# Patient Record
Sex: Female | Born: 1985
Health system: Southern US, Community
[De-identification: ages and names within clinical notes are randomized; demographics above are authoritative.]

## PROBLEM LIST (undated history)

## (undated) DIAGNOSIS — T7840XA Allergy, unspecified, initial encounter: Secondary | ICD-10-CM

## (undated) DIAGNOSIS — J45909 Unspecified asthma, uncomplicated: Secondary | ICD-10-CM

## (undated) HISTORY — DX: Allergy, unspecified, initial encounter: T78.40XA

## (undated) HISTORY — DX: Unspecified asthma, uncomplicated: J45.909

---

## 2012-01-01 HISTORY — PX: WISDOM TOOTH EXTRACTION: SHX21

## 2015-08-31 ENCOUNTER — Ambulatory Visit: Payer: BC Managed Care – PPO | Attending: Gynecologic Oncology | Admitting: Gynecologic Oncology

## 2015-08-31 ENCOUNTER — Ambulatory Visit (HOSPITAL_BASED_OUTPATIENT_CLINIC_OR_DEPARTMENT_OTHER): Payer: BC Managed Care – PPO

## 2015-08-31 ENCOUNTER — Encounter: Payer: Self-pay | Admitting: Gynecologic Oncology

## 2015-08-31 VITALS — BP 124/84 | HR 106 | Temp 98.1°F | Resp 20 | Ht 65.0 in | Wt 259.5 lb

## 2015-08-31 DIAGNOSIS — N839 Noninflammatory disorder of ovary, fallopian tube and broad ligament, unspecified: Secondary | ICD-10-CM | POA: Diagnosis not present

## 2015-08-31 DIAGNOSIS — E669 Obesity, unspecified: Secondary | ICD-10-CM | POA: Insufficient documentation

## 2015-08-31 DIAGNOSIS — R1900 Intra-abdominal and pelvic swelling, mass and lump, unspecified site: Secondary | ICD-10-CM | POA: Diagnosis not present

## 2015-08-31 DIAGNOSIS — N838 Other noninflammatory disorders of ovary, fallopian tube and broad ligament: Secondary | ICD-10-CM

## 2015-08-31 LAB — LACTATE DEHYDROGENASE (CC13): LDH: 663 U/L — AB (ref 125–245)

## 2015-08-31 NOTE — Patient Instructions (Signed)
Plan for surgery at [facility] on Tuesday, Sept 6 with Dr. Nancy Marus.  You will receive a phone call from [facility] to have you come in for a pre-op appt.  Please call for any questions or concerns.

## 2015-08-31 NOTE — Progress Notes (Signed)
Consult Note: Gyn-Onc  Sheena Miles 29 y.o. female  CC:  Chief Complaint  Patient presents with  . pelvic mass    new consult    HPI: Patient is seen today in consultation at the request of Dr. [name] for a newly diagnosed ovarian mass. Primary MD. Dr. Leonarda Salon.  Patient is a very pleasant 29 year old gravida 0 whose last cycle was August 11. She states that she has had irregular cycles been going on for many many years. She went through menarche at the age of 68 and has had some irregular cycles since that time. She's been under a lot of stress as she had a difficult relationship with her husband and she left him in June of this year. She began experiencing some pain in her shoulder, abdomen, leg and began having some irregular bowel movements which she felt related to stress. She was seen in urgent care on Monday she felt that she needed medications for anxiety as she was feeling quite tense. At that time and exam was performed that revealed some tenderness in the right lower quadrant. She also had a low-grade temperature and was referred to the emergency room is felt that she might have appendicitis or cholecystitis. She states that in retrospect she has noted that her abdomen was getting "hard" starting in early August. She was also having some back pain. She's had some increasing pelvic pain over the past 2 weeks. She is also experiencing some nausea/vomiting started in June. She's gained approximately 30 pounds in the last 5-6 months and is been having increasing fatigue.  In the emergency room, her CT scan revealed: She had a small right and large left sided pleural effusion with mild underlying atelectasis bilaterally. The gallbladder appeared normal. There was some ascites with a fluid collection in the area of the falciform ligament measuring 4 x 3 cm. There was another lentiform collection with a similar attenuation. Overall she had moderate volume ascites throughout the  abdomen. The pancreas, spleen, adrenals, kidneys, stomach, bowel were unremarkable. There are large diaphragmatic lymph nodes anterior to the heart measuring up to 1.3 cm. There are numerous small mesenteric lymph nodes present. The uterus and ovaries were not discretely identified. There is a multi-cystic septated complex mass arising out of the low pelvis and extending into the left abdomen. It measured 13 x 7 x 7 cm. A prominently had cystic component along the cranial edge along the caudal edge of the abnormality. There is infiltration and probable carcinomatosis of the omentum. There were numerous omental implants with the largest measuring 3.8 cm. A CA-125 was not done.  Review of systems is otherwise unremarkable other than the above. 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 She drinks alcohol socially. She does not smoke. There are no gynecologic malignancies in her family. Her paternal grandmother had breast cancer in her 2s or 66s. Her last Pap smear was approximately 2 years ago. She was told she did not need another one for 3-5 years.  Current Meds:  Outpatient Encounter Prescriptions as of 08/31/2015  Medication Sig  . fexofenadine (ALLEGRA) 60 MG tablet Take 60 mg by mouth daily.  . Multiple Vitamins-Minerals (MULTI-VITAMIN GUMMIES) CHEW Chew by mouth daily.  . ondansetron (ZOFRAN-ODT) 4 MG disintegrating tablet Take 4 mg by mouth every 6 (six) hours as needed for nausea or vomiting.  Marland Kitchen oxyCODONE-acetaminophen (PERCOCET/ROXICET) 5-325 MG per tablet Take 1-2 tablets by mouth every 4 (four) hours as needed for severe  pain.  . Probiotic Product (PROBIOTIC DAILY) CAPS Take 1 capsule by mouth daily.  Marland Kitchen triamcinolone (NASACORT ALLERGY 24HR) 55 MCG/ACT AERO nasal inhaler Place 2 sprays into the nose daily.   No facility-administered encounter medications on file as of 08/31/2015.    Allergy:  Allergies  Allergen Reactions  . Codeine Palpitations    Described by  patient as "horrible panic attack feeling and shortness of breath"    Social Hx:   Social History   Social History  . Marital Status: Legally Separated    Spouse Name: N/A  . Number of Children: N/A  . Years of Education: N/A   Occupational History  . Not on file.   Social History Main Topics  . Smoking status: Never Smoker   . Smokeless tobacco: Not on file  . Alcohol Use: Yes     Comment: 1-2 nights week socially  . Drug Use: No  . Sexual Activity: No   Other Topics Concern  . Not on file   Social History Narrative  . No narrative on file    Past Surgical Hx:  Past Surgical History  Procedure Laterality Date  . Wisdom tooth extraction  2013    Past Medical Hx:  Past Medical History  Diagnosis Date  . Allergy   . Asthma     exercise induced    Oncology Hx:   No history exists.    Family Hx:  Family History  Problem Relation Age of Onset  . Diabetes Father   . Congestive Heart Failure Father   . Crohn's disease Maternal Grandmother   . Breast cancer Paternal Grandmother     Vitals:  Blood pressure 124/84, pulse 106, temperature 98.1 F (36.7 C), temperature source Oral, resp. rate 20, height 5\' 5"  (1.651 m), weight 259 lb 8 oz (117.708 kg), SpO2 98 %.  Physical Exam:  Somewhat ill-appearing female in no acute distress.  Neck: Supple, no lymphadenopathy, no thyromegaly.  Lungs: Decreased breath sounds on the left approximately half way up. Clear to auscultation on the right.  Cardiac: Regular rate and rhythm.  Abdomen: Obese and protuberant. Positive fluid wave. Abdomen is soft, minimally tender to deep palpation in the bilateral lower quadrants. There is no distinct mass appreciated. There is no hepatosplenomegaly. However, exam is limited by habitus.  Groins: No lymphadenopathy.  Extremities: No edema.  Pelvic: Normal female genitalia. The cervix is difficult to visualize it is anteverted posterior to the pubic symphysis. It is nulliparous.  There are no gross visible lesions. Bimanual examination there is a large mass in the cul-de-sac. It could be that the uterus is retroflexed versus an enlarged mass that fills the cul-de-sac it approximately 8 cm. It is smooth. On rectal examination there is no nodularity. I cannot feel the mass anteriorly on abdominal exam.  Assessment/Plan: 29 year old with large abdominal pelvic mass most consistent with a probable ovarian malignancy. She has evidence of ascites, pleural effusion, and omental nodularity. I reviewed this with her and her mother. We will obtain an LDH, AFP, inhibin, CA-125. She's had a negative hCG. I discussed with him that we need to proceed with surgery. That she would undergo an exploratory laparotomy via a midline vertical incision. We will remove the affected ovary first and sent it for frozen section. If there is a malignancy and there is disease on the contralateral ovary she understands that we will proceed with a bilateral salpingo-oophorectomy and appropriate staging/debulking procedure. If her uterus appears unremarkable she would like to keep  in situ for consideration of pregnancy with donor egg in the future.  She was offered surgical dates she's both in [place] and at [facility]. The patient opted to have surgery at [facility] as the OR date was sooner on September 6. She knows that she will have to have a preoperative visit at [facility]. She was given my card and knows that we'll be happy to complete family paperwork for her. I discussed with her that she be out of work 4-6 weeks in the hospital for 3-5 days. She knows that [facility] preop visit as well as have the opportunity to see anesthesia. We discussed postoperative pain management and let it sit for concern of hers.  She has my card and knows that she can contact me with any questions prior to her surgery. Her questions as well as those of her mother were elicited in answer to their satisfaction.  Alessandra Sawdey A.,  MD 08/31/2015, 10:10 AM

## 2015-09-01 LAB — CA 125: CA 125: 33145 U/mL — AB (ref <35)

## 2015-09-01 LAB — AFP TUMOR MARKER: AFP TUMOR MARKER: 2.2 ng/mL (ref <6.1)

## 2015-09-01 LAB — CEA: CEA: <0.5 ng/mL (ref 0.0–5.0)

## 2015-09-08 LAB — INHIBIN B: Inhibin B: <10 pg/mL

## 2015-09-15 ENCOUNTER — Telehealth: Payer: Self-pay | Admitting: *Deleted

## 2015-09-15 NOTE — Telephone Encounter (Signed)
Left message on voice mail with future appointment details . Pt is scheduled on 10/05/2015  @ 3pm to see Dr. Alycia Rossetti.

## 2015-09-21 ENCOUNTER — Ambulatory Visit: Payer: BC Managed Care – PPO | Attending: Gynecologic Oncology | Admitting: Gynecologic Oncology

## 2015-09-21 ENCOUNTER — Encounter: Payer: Self-pay | Admitting: Gynecologic Oncology

## 2015-09-21 ENCOUNTER — Ambulatory Visit (HOSPITAL_BASED_OUTPATIENT_CLINIC_OR_DEPARTMENT_OTHER): Payer: BC Managed Care – PPO

## 2015-09-21 VITALS — BP 123/78 | HR 89 | Temp 98.1°F | Resp 20 | Ht 65.0 in | Wt 244.2 lb

## 2015-09-21 DIAGNOSIS — T8131XA Disruption of external operation (surgical) wound, not elsewhere classified, initial encounter: Secondary | ICD-10-CM | POA: Diagnosis not present

## 2015-09-21 DIAGNOSIS — C569 Malignant neoplasm of unspecified ovary: Secondary | ICD-10-CM | POA: Diagnosis present

## 2015-09-21 LAB — COMPREHENSIVE METABOLIC PANEL (CC13)
ALBUMIN: 2.5 g/dL — AB (ref 3.5–5.0)
ALK PHOS: 106 U/L (ref 40–150)
ALT: 17 U/L (ref 0–55)
AST: 18 U/L (ref 5–34)
Anion Gap: 9 mEq/L (ref 3–11)
BUN: 9.1 mg/dL (ref 7.0–26.0)
CALCIUM: 9 mg/dL (ref 8.4–10.4)
CO2: 26 mEq/L (ref 22–29)
Chloride: 105 mEq/L (ref 98–109)
Creatinine: 0.7 mg/dL (ref 0.6–1.1)
EGFR: >90 mL/min/{1.73_m2} (ref >90)
GLUCOSE: 94 mg/dL (ref 70–140)
Potassium: 4.4 mEq/L (ref 3.5–5.1)
SODIUM: 141 meq/L (ref 136–145)
TOTAL PROTEIN: 6.9 g/dL (ref 6.4–8.3)

## 2015-09-21 LAB — CBC WITH DIFFERENTIAL/PLATELET
BASO%: 1.1 % (ref 0.0–2.0)
BASOS ABS: 0.1 10*3/uL (ref 0.0–0.1)
EOS ABS: 0.1 10*3/uL (ref 0.0–0.5)
EOS%: 1.4 % (ref 0.0–7.0)
HEMATOCRIT: 31.3 % — AB (ref 34.8–46.6)
HEMOGLOBIN: 10.2 g/dL — AB (ref 11.6–15.9)
LYMPH#: 1.6 10*3/uL (ref 0.9–3.3)
LYMPH%: 19.4 % (ref 14.0–49.7)
MCH: 27 pg (ref 25.1–34.0)
MCHC: 32.6 g/dL (ref 31.5–36.0)
MCV: 82.7 fL (ref 79.5–101.0)
MONO#: 0.7 10*3/uL (ref 0.1–0.9)
MONO%: 8 % (ref 0.0–14.0)
NEUT%: 70.1 % (ref 38.4–76.8)
NEUTROS ABS: 5.7 10*3/uL (ref 1.5–6.5)
Platelets: 734 10*3/uL — ABNORMAL HIGH (ref 145–400)
RBC: 3.78 10*6/uL (ref 3.70–5.45)
RDW: 17.4 % — AB (ref 11.2–14.5)
WBC: 8.2 10*3/uL (ref 3.9–10.3)

## 2015-09-21 MED ORDER — HYDROMORPHONE HCL 2 MG PO TABS: 2.0000 mg | ORAL_TABLET | Freq: Four times a day (QID) | ORAL | Status: AC | PRN

## 2015-09-21 MED ORDER — LORAZEPAM 0.5 MG PO TABS
0.5000 mg | ORAL_TABLET | Freq: Three times a day (TID) | ORAL | Status: DC | PRN
Start: 2015-09-21 — End: 2015-10-10

## 2015-09-21 NOTE — Patient Instructions (Addendum)
Pack the opening in your incision twice daily.  Moisten the kerlix gauze roll with saline and squeeze out the excess liquid.  Plan to come to the office tomorrow for dressing change/education.  Please call for office for any questions or concerns.  Continue taking antibiotic given to you at [facility] by Dr. Alycia Rossetti.  No need to change the dressing again today and we will change it in the am.    Dressing Change A dressing is a material placed over wounds. It keeps the wound clean, dry, and protected from further injury.  BEFORE YOU BEGIN  Get your supplies together. Things you may need include:  Salt solution (saline).  Flexible gauze bandage.  Medicated cream.  Tape.  Gloves.  Belly (abdominal) pads.  Gauze squares.  Plastic bags.  Take pain medicine 30 minutes before the bandage change if you need it.  Take a shower before you do the first bandage change of the day. Put plastic wrap or a bag over the dressing. REMOVING YOUR OLD BANDAGE  Wash your hands with soap and water. Dry your hands with a clean towel.  Put on your gloves.  Remove any tape.  Remove the old bandage as told. If it sticks, put a small amount of warm water on it to loosen the bandage.  Remove any gauze or packing tape in your wound.  Take off your gloves.  Put the gloves, tape, gauze, or any packing tape in a plastic bag. CHANGING YOUR BANDAGE  Open the supplies.  Take the cap off the salt solution.  Open the gauze. Leave the gauze on the inside of the package.  Put on your gloves.  Clean your wound as told by your doctor.  Keep your wound dry if your doctor told you to do so.  Your doctor may tell you to do one or more of the following:  Pick up the gauze. Pour the salt solution over the gauze. Squeeze out the extra salt solution.  Put medicated cream or other medicine on your wound.  Put solution soaked gauze only in your wound, not on the skin around it.  Pack your wound loosely.  Put  dry gauze on your wound.  Put belly pads over the dry gauze if your bandages soak through.  Tape the bandage in place so it will not fall off. Do not wrap the tape all the way around your arm or leg.  Wrap the bandage with the flexible gauze bandage as told by your doctor.  Take off your gloves. Put them in the plastic bag with the old bandage. Tie the bag shut and throw it away.  Keep the bandage clean and dry.  Wash your hands. GET HELP RIGHT AWAY IF:   Your skin around the wound looks red.  Your wound feels more tender or sore.  You see yellowish-white fluid (pus) in the wound.  Your wound smells bad.  You have a fever.  Your skin around the wound has a red rash that itches and burns.  You see black or yellow skin in your wound that was not there before.  You feel sick to your stomach (nauseous), throw up (vomit), and feel very tired. Document Released: 03/15/2009 Document Revised: 05/03/2014 Document Reviewed: 10/28/2011 [facility] Patient Information 2015 [place], [place]. This information is not intended to replace advice given to you by your health care provider. Make sure you discuss any questions you have with your health care provider.

## 2015-09-22 ENCOUNTER — Ambulatory Visit: Payer: BC Managed Care – PPO | Attending: Gynecologic Oncology | Admitting: Gynecologic Oncology

## 2015-09-22 VITALS — BP 125/67 | HR 93 | Temp 98.2°F | Resp 18 | Ht 65.0 in | Wt 242.0 lb

## 2015-09-22 DIAGNOSIS — T8131XD Disruption of external operation (surgical) wound, not elsewhere classified, subsequent encounter: Secondary | ICD-10-CM | POA: Insufficient documentation

## 2015-09-22 LAB — CA 125: CA 125: 14065 U/mL — AB (ref <35)

## 2015-09-22 NOTE — Patient Instructions (Addendum)
We will call you with a date and time with meet with Dr. Marko Plume on Sept 30.  We will assess your incision at that time.  Please call for any questions or concerns.  Pack the wound twice daily.  Increase your protein intake.  We will also have you meet with the genetics counselor.  High Protein Diet A high protein diet means that high protein foods are added to your diet. Getting more protein in the diet is important for a number of reasons. Protein helps the body to build tissue, muscle, and to repair damage. People who have had surgery, injuries such as broken bones, infections, and burns, or illnesses such as cancer, may need more protein in their diet.  SERVING SIZES Measuring foods and serving sizes helps to make sure you are getting the right amount of food. The list below tells how big or small some common serving sizes are.   1 oz.........4 stacked dice.  3 oz........Marland KitchenDeck of cards.  1 tsp.......Marland KitchenTip of little finger.  1 tbs......Marland KitchenMarland KitchenThumb.  2 tbs.......Marland KitchenGolf ball.   cup......Marland KitchenHalf of a fist.  1 cup.......Marland KitchenA fist. FOOD SOURCES OF PROTEIN Listed below are some food sources of protein and the amount of protein they contain. Your Registered Dietitian can calculate how many grams of protein you need for your medical condition. High protein foods can be added to the diet at mealtime or as snacks. Be sure to have at least 1 protein-containing food at each meal and snack to ensure adequate intake.  Meats and Meat Substitutes / Protein (g)  3 oz poultry (chicken, [place]) / 26 g  3 oz tuna, canned in water / 26 g  3 oz fish (cod) / 21 g  3 oz red meat (beef, pork) / 21 g  4 oz tofu / 9 g  1 egg / 6 g   cup egg substitute / 5 g  1 cup dried beans / 15 g  1 cup soy milk / 4 g Dairy / Protein (g)  1 cup milk (skim, 1%, 2%, whole) / 8 g   cup evaporated milk / 9 g  1 cup buttermilk / 8 g  1 cup low-fat plain yogurt / 11 g  1 cup regular plain yogurt / 9 g   cup  cottage cheese / 14 g  1 oz cheddar cheese / 7 g Nuts / Protein (g)  2 tbs peanut butter / 8 g  1 oz peanuts / 7 g  2 tbs cashews / 5 g  2 tbs almonds / 5 g Document Released: 12/17/2005 Document Revised: 03/10/2012 Document Reviewed: 09/19/2007 [employer] Patient Information 2015 [place], [place]. This information is not intended to replace advice given to you by your health care provider. Make sure you discuss any questions you have with your health care provider.

## 2015-09-23 NOTE — Progress Notes (Signed)
Follow Up Note: Gyn-Onc  Sheena Miles 29 y.o. female  CC:  Chief Complaint  Patient presents with  . Wound separation    follow up post-op from [facility]    HPI:  Sheena Miles is a 29 year old female, gravida 0, initially referred by Dr. [name] for a newly diagnosed ovarian mass.  She had irregular cycles for many years and went through menarche at the age of 66.  She was experiencing a lot of stress as she had a difficult relationship with her husband and she left him in June of this year. She began experiencing some pain in her shoulder, abdomen, leg and began having some irregular bowel movements which she felt related to stress.  She sought care at an Urgent Care for her moderate anxiety.  At that time, an exam was performed that revealed some tenderness in the right lower quadrant. She also had a low-grade temperature and was referred to the emergency room to rule out appendicitis or cholecystitis. She reports her abdomen was getting "hard" in early August with intermittent back pain. She also began experiencing pelvic pain over the past 2 weeks with intermittent nausea/vomiting that started in June. She's gained approximately 30 pounds in the last 5-6 months and is been having increasing fatigue.  In the emergency room, her CT scan revealed: She had a small right and large left sided pleural effusion with mild underlying atelectasis bilaterally. The gallbladder appeared normal. There was some ascites with a fluid collection in the area of the falciform ligament measuring 4 x 3 cm. There was another lentiform collection with a similar attenuation. Overall she had moderate volume ascites throughout the abdomen. The pancreas, spleen, adrenals, kidneys, stomach, bowel were unremarkable. There are large diaphragmatic lymph nodes anterior to the heart measuring up to 1.3 cm. There are numerous small mesenteric lymph nodes present. The uterus and ovaries were not discretely identified. There is a  multi-cystic septated complex mass arising out of the low pelvis and extending into the left abdomen. It measured 13 x 7 x 7 cm. A prominently had cystic component along the cranial edge along the caudal edge of the abnormality. There is infiltration and probable carcinomatosis of the omentum. There were numerous omental implants with the largest measuring 3.8 cm.   She was seen by Dr. Nancy Marus on 08/31/15 in [place].  Lab work resulted: CA 125 [id number], LDH 663, Inhibin B <10, CEA <0.5, AFP 2.2. On 09/06/15, she underwent an exploratory laparotomy with supracervical hysterectomy, bilateral salpingoophorectomy, omentectomy, peritoneal stripping, resection of bladder nodule, rectosigmoid resection with mobilization of the splenic flexure and primary end-to-end anastamosis (R2 resection) at [facility] with Dr. Nancy Marus.  Operative findings included: 1. 3L of bloody ascites on abdominal entry  2. 12cm omental mass adherent to the colon, pelvic mass, anterior abdominal wall - resected and frozen with serous cancer  3. Bilateral adnexal masses densely adherent to the uterus, sigmoid colon, and rectum - resected en bloc  4. Pelvic peritoneal nodularity  5. 3cm Bladder nodule - resected  6. Residual tumor along the entirely of the small bowel mesentery with multiple nodules up to 1.5cm in size, miliary disease of the diaphragm, and peritoneal nodularity along the pelvis and rectum (R2)  7. Normal appearing gallbladder, liver, spleen, and stomach  8. Negative bubble test following primary anastamosis of the recto-sigmoid with two donuts removed following anastamosis  Final pathology revealed: Final Diagnosis  FSA, A: Omentum, partial omentectomy  - Extensive metastatic serous carcinoma  B: Uterus, cervix, bilateral tubes and ovaries and colon, hysterectomy, bilateral salpingo-oophorectomy and partial colectomy  - High grade serous carcinoma, see synoptic report below  C: Colon, anastomotic rings, excision   - Colonic mucosa and wall negative for malignancy   Port a cath was placed at [facility] on 09/09/15.       Interval History:  She presents today with her mother after having her staples removed at [facility] on Monday.  She states Monday evening she was going up and down a flight of stairs and noticed her incision had opened in a small area.  She reports yellowish drainage since that time.  She contacted [facility] on that day and was told to monitor it and keep a dry dressing over the area.  Mother called this am stating the incision has increased in drainage.  She had placed pieces of tape on the lower portion of the incision to try to pull the skin together but the drainage persists.  She is here for evaluation of her incision.  Denies fever, chills.  Ambulating without difficulty.  No nausea or emesis reported except for nausea when she visualized her incision when the small opening developed.  Tolerating diet.  Bowels and bladder functioning without difficulty.  Minimal pain at this time but reports relief with use of hydromorphone for severe pain.  Appetite improving.  She has been wearing her abdominal binder intermittently at home.  No other concerns voiced.      Review of Systems  Constitutional: Feels tired at times but improving.  No fever, chills.    Cardiovascular: No chest pain, shortness of breath, or edema.  Pulmonary: No cough or wheeze.  Gastrointestinal: No nausea, vomiting, or diarrhea. No bright red blood per rectum or change in bowel movement.  Genitourinary: No frequency, urgency, or dysuria. No vaginal bleeding or discharge.  Musculoskeletal: No myalgia or joint pain. Neurologic: No weakness, numbness, or change in gait.  Psychology:  Anxious at times.  Insomnia related to anxiety.  Current Meds:  Outpatient Encounter Prescriptions as of 09/21/2015  Medication Sig  . [EXPIRED] cephALEXin (KEFLEX) 500 MG capsule Take 500 mg by mouth.  . docusate sodium (COLACE) 100 MG capsule Take 100 mg by  mouth.  . enoxaparin (LOVENOX) 40 MG/0.4ML injection Inject 40 mg into the skin.  . fexofenadine (ALLEGRA) 60 MG tablet Take 60 mg by mouth daily.  Marland Kitchen HYDROmorphone (DILAUDID) 2 MG tablet Take 1 tablet (2 mg total) by mouth every 6 (six) hours as needed for severe pain.  Marland Kitchen ibuprofen (ADVIL,MOTRIN) 600 MG tablet Take 600 mg by mouth.  Marland Kitchen LORazepam (ATIVAN) 0.5 MG tablet Take 1 tablet (0.5 mg total) by mouth every 8 (eight) hours as needed for anxiety or sleep.  . Multiple Vitamins-Minerals (MULTI-VITAMIN GUMMIES) CHEW Chew by mouth daily.  Marland Kitchen omeprazole (PRILOSEC) 10 MG capsule Take 10 mg by mouth.  . ondansetron (ZOFRAN) 4 MG tablet Take 4 mg by mouth.  . ondansetron (ZOFRAN-ODT) 4 MG disintegrating tablet Take 4 mg by mouth every 6 (six) hours as needed for nausea or vomiting.  Marland Kitchen oxyCODONE-acetaminophen (PERCOCET/ROXICET) 5-325 MG per tablet Take 1-2 tablets by mouth every 4 (four) hours as needed for severe pain.  . polyethylene glycol (MIRALAX / GLYCOLAX) packet Take by mouth.  . Probiotic Product (PROBIOTIC DAILY) CAPS Take 1 capsule by mouth daily.  . simethicone (MYLICON) 80 MG chewable tablet Chew 80 mg by mouth.  . triamcinolone (NASACORT ALLERGY 24HR) 55 MCG/ACT AERO nasal inhaler Place 2 sprays  into the nose daily.  . [DISCONTINUED] HYDROmorphone (DILAUDID) 2 MG tablet Take 2 mg by mouth.  . [DISCONTINUED] LORazepam (ATIVAN) 0.5 MG tablet Take 0.5 mg by mouth.   No facility-administered encounter medications on file as of 09/21/2015.    Allergy:  Allergies  Allergen Reactions  . Codeine Palpitations    Described by patient as "horrible panic attack feeling and shortness of breath"    Social Hx:   Social History   Social History  . Marital Status: Legally Separated    Spouse Name: N/A  . Number of Children: N/A  . Years of Education: N/A   Occupational History  . Not on file.   Social History Main Topics  . Smoking status: Never Smoker   . Smokeless tobacco: Not on file   . Alcohol Use: Yes     Comment: 1-2 nights week socially  . Drug Use: No  . Sexual Activity: No   Other Topics Concern  . Not on file   Social History Narrative    Past Surgical Hx:  Past Surgical History  Procedure Laterality Date  . Wisdom tooth extraction  2013    Past Medical Hx:  Past Medical History  Diagnosis Date  . Allergy   . Asthma     exercise induced    Family Hx:  Family History  Problem Relation Age of Onset  . Diabetes Father   . Congestive Heart Failure Father   . Crohn's disease Maternal Grandmother   . Breast cancer Paternal Grandmother     Vitals:  Blood pressure 123/78, pulse 89, temperature 98.1 F (36.7 C), temperature source Oral, resp. rate 20, height 5\' 5"  (1.651 m), weight 244 lb 3.2 oz (110.768 kg), SpO2 100 %.  Physical Exam:  General: Well developed, well nourished female in no acute distress. Alert and oriented x 3.  Neck: Supple without any enlargements.  Cardiovascular: Regular rate and rhythm. S1 and S2 normal.  Lungs: Clear to auscultation bilaterally. No wheezes/crackles/rhonchi noted.  Skin: No rashes or lesions present. Back: No CVA tenderness.  Abdomen: Abdomen soft, non-tender and obese. Active bowel sounds in all quadrants. Tape removed from lower portion of the incision.  Steri strips still in place on upper aspect of the incision.  The small opening opened to 3 cm x 5 cm with 3 cm depth.  Minimal amount of serous drainage from the incision.  Lower portion of the incision under the pannus erythematous but not appearing infected.  Incision also assessed by Dr. Alycia Rossetti.  Wound bed red/pink.  No evidence of infection.  Wet to dry performed to the incision and patient's mother instructed.    Extremities: No bilateral cyanosis, edema, or clubbing.   Assessment/Plan:  29 year old s/p exploratory laparotomy with supracervical hysterectomy, bilateral salpingoophorectomy, omentectomy, peritoneal stripping, resection of bladder nodule,  rectosigmoid resection with mobilization of the splenic flexure and primary end-to-end anastamosis (R2 resection) at [facility] with Dr. Nancy Marus on 08/31/15 for high grade serous carcinoma of the ovary.  In order to expedite treatment, her first cycle of chemotherapy will be given at [facility] on 09/26/15.  She will then be under the care of Dr. Evlyn Clines in [place] with first consultation on September 30.  She is advised to pack the incision twice daily.  She is to come back to the office tomorrow for her mother to pack the incision with guidance.  Reportable signs and symptoms reviewed.  Wound care supplies given.  She is advised to call for  any questions or concerns.  Dr. Alycia Rossetti spoke with the patient and assessed her wound as well.  Arrangements will be made for the patient to come to [facility] on Monday for chemotherapy.        CROSS, MELISSA DEAL, NP 09/26/2015, 2:24 PM

## 2015-09-27 ENCOUNTER — Other Ambulatory Visit: Payer: Self-pay | Admitting: Oncology

## 2015-09-27 DIAGNOSIS — C569 Malignant neoplasm of unspecified ovary: Secondary | ICD-10-CM

## 2015-09-27 NOTE — Progress Notes (Signed)
Follow Up Note: Gyn-Onc  Sheena Miles 29 y.o. female  CC:  Chief Complaint  Patient presents with  . Wound Check    Follow up dressing change    HPI:  Sheena Miles is a 29 year old female, gravida 0, initially referred by Dr. [name] for a newly diagnosed ovarian mass.  She had irregular cycles for many years and went through menarche at the age of 49.  She was experiencing a lot of stress as she had a difficult relationship with her husband and she left him in June of this year. She began experiencing some pain in her shoulder, abdomen, leg and began having some irregular bowel movements which she felt related to stress.  She sought care at an Urgent Care for her moderate anxiety.  At that time, an exam was performed that revealed some tenderness in the right lower quadrant. She also had a low-grade temperature and was referred to the emergency room to rule out appendicitis or cholecystitis. She reports her abdomen was getting "hard" in early August with intermittent back pain. She also began experiencing pelvic pain over the past 2 weeks with intermittent nausea/vomiting that started in June. She's gained approximately 30 pounds in the last 5-6 months and is been having increasing fatigue.  In the emergency room, her CT scan revealed: She had a small right and large left sided pleural effusion with mild underlying atelectasis bilaterally. The gallbladder appeared normal. There was some ascites with a fluid collection in the area of the falciform ligament measuring 4 x 3 cm. There was another lentiform collection with a similar attenuation. Overall she had moderate volume ascites throughout the abdomen. The pancreas, spleen, adrenals, kidneys, stomach, bowel were unremarkable. There are large diaphragmatic lymph nodes anterior to the heart measuring up to 1.3 cm. There are numerous small mesenteric lymph nodes present. The uterus and ovaries were not discretely identified. There is a  multi-cystic septated complex mass arising out of the low pelvis and extending into the left abdomen. It measured 13 x 7 x 7 cm. A prominently had cystic component along the cranial edge along the caudal edge of the abnormality. There is infiltration and probable carcinomatosis of the omentum. There were numerous omental implants with the largest measuring 3.8 cm.   She was seen by Dr. Nancy Marus on 08/31/15 in [place].  Lab work resulted: CA 125 33145, LDH 663, Inhibin B <10, CEA <0.5, AFP 2.2. On 09/06/15, she underwent an exploratory laparotomy with supracervical hysterectomy, bilateral salpingoophorectomy, omentectomy, peritoneal stripping, resection of bladder nodule, rectosigmoid resection with mobilization of the splenic flexure and primary end-to-end anastamosis (R2 resection) at [facility] with Dr. Nancy Marus.  Operative findings included: 1. 3L of bloody ascites on abdominal entry  2. 12cm omental mass adherent to the colon, pelvic mass, anterior abdominal wall - resected and frozen with serous cancer  3. Bilateral adnexal masses densely adherent to the uterus, sigmoid colon, and rectum - resected en bloc  4. Pelvic peritoneal nodularity  5. 3cm Bladder nodule - resected  6. Residual tumor along the entirely of the small bowel mesentery with multiple nodules up to 1.5cm in size, miliary disease of the diaphragm, and peritoneal nodularity along the pelvis and rectum (R2)  7. Normal appearing gallbladder, liver, spleen, and stomach  8. Negative bubble test following primary anastamosis of the recto-sigmoid with two donuts removed following anastamosis  Final pathology revealed: Final Diagnosis  FSA, A: Omentum, partial omentectomy  - Extensive metastatic serous carcinoma  B: Uterus, cervix, bilateral tubes and ovaries and colon, hysterectomy, bilateral salpingo-oophorectomy and partial colectomy  - High grade serous carcinoma, see synoptic report below  C: Colon, anastomotic rings, excision   - Colonic mucosa and wall negative for malignancy   Port a cath was placed at [facility] on 09/09/15.       Interval History:  She presents today with her mother for demonstration of wound dressing change.  She reports doing well overnight.  Her mother will be doing the dressing changes at home and Dr. Alycia Rossetti wanted to have her change the dressing with supervision and staff support.  Denies fever, chills.  Ambulating without difficulty.  No nausea or emesis.  Tolerating diet.  Bowels and bladder functioning without difficulty.  Minimal pain at this time but reports relief with use of hydromorphone for severe pain.  Appetite improving.  She has been wearing her abdominal binder intermittently at home.  No other concerns voiced.      Review of Systems  Constitutional: Feels tired at times but improving.  No fever, chills.    Cardiovascular: No chest pain, shortness of breath, or edema.  Pulmonary: No cough or wheeze.  Gastrointestinal: No nausea, vomiting, or diarrhea. No bright red blood per rectum or change in bowel movement.  Genitourinary: No frequency, urgency, or dysuria. No vaginal bleeding or discharge.  Musculoskeletal: No myalgia or joint pain. Neurologic: No weakness, numbness, or change in gait.  Psychology:  Anxious at times.  Insomnia related to anxiety.  Current Meds:  Outpatient Encounter Prescriptions as of 09/22/2015  Medication Sig  . [EXPIRED] cephALEXin (KEFLEX) 500 MG capsule Take 500 mg by mouth.  . docusate sodium (COLACE) 100 MG capsule Take 100 mg by mouth.  . enoxaparin (LOVENOX) 40 MG/0.4ML injection Inject 40 mg into the skin.  . fexofenadine (ALLEGRA) 60 MG tablet Take 60 mg by mouth daily.  Marland Kitchen HYDROmorphone (DILAUDID) 2 MG tablet Take 1 tablet (2 mg total) by mouth every 6 (six) hours as needed for severe pain.  Marland Kitchen ibuprofen (ADVIL,MOTRIN) 600 MG tablet Take 600 mg by mouth.  Marland Kitchen LORazepam (ATIVAN) 0.5 MG tablet Take 1 tablet (0.5 mg total) by mouth every 8 (eight) hours  as needed for anxiety or sleep.  . Multiple Vitamins-Minerals (MULTI-VITAMIN GUMMIES) CHEW Chew by mouth daily.  Marland Kitchen omeprazole (PRILOSEC) 10 MG capsule Take 10 mg by mouth.  . ondansetron (ZOFRAN) 4 MG tablet Take 4 mg by mouth.  . ondansetron (ZOFRAN-ODT) 4 MG disintegrating tablet Take 4 mg by mouth every 6 (six) hours as needed for nausea or vomiting.  Marland Kitchen oxyCODONE-acetaminophen (PERCOCET/ROXICET) 5-325 MG per tablet Take 1-2 tablets by mouth every 4 (four) hours as needed for severe pain.  . [EXPIRED] polyethylene glycol (MIRALAX / GLYCOLAX) packet Take by mouth.  . Probiotic Product (PROBIOTIC DAILY) CAPS Take 1 capsule by mouth daily.  . simethicone (MYLICON) 80 MG chewable tablet Chew 80 mg by mouth.  . triamcinolone (NASACORT ALLERGY 24HR) 55 MCG/ACT AERO nasal inhaler Place 2 sprays into the nose daily.   No facility-administered encounter medications on file as of 09/22/2015.    Allergy:  Allergies  Allergen Reactions  . Codeine Palpitations    Described by patient as "horrible panic attack feeling and shortness of breath"    Social Hx:   Social History   Social History  . Marital Status: Legally Separated    Spouse Name: N/A  . Number of Children: N/A  . Years of Education: N/A   Occupational History  .  Not on file.   Social History Main Topics  . Smoking status: Never Smoker   . Smokeless tobacco: Not on file  . Alcohol Use: Yes     Comment: 1-2 nights week socially  . Drug Use: No  . Sexual Activity: No   Other Topics Concern  . Not on file   Social History Narrative    Past Surgical Hx:  Past Surgical History  Procedure Laterality Date  . Wisdom tooth extraction  2013    Past Medical Hx:  Past Medical History  Diagnosis Date  . Allergy   . Asthma     exercise induced    Family Hx:  Family History  Problem Relation Age of Onset  . Diabetes Father   . Congestive Heart Failure Father   . Crohn's disease Maternal Grandmother   . Breast cancer  Paternal Grandmother     Vitals:  Blood pressure 125/67, pulse 93, temperature 98.2 F (36.8 C), temperature source Oral, resp. rate 18, height 5\' 5"  (1.651 m), weight 242 lb (109.77 kg).  Physical Exam:  General: Well developed, well nourished female in no acute distress. Alert and oriented x 3.  Neck: Supple without any enlargements.  Cardiovascular: Regular rate and rhythm. S1 and S2 normal.  Lungs: Clear to auscultation bilaterally. No wheezes/crackles/rhonchi noted.  Skin: No rashes or lesions present. Back: No CVA tenderness.  Abdomen: Abdomen soft, non-tender and obese. Active bowel sounds in all quadrants. 3 cm x 5 cm with 3 cm depth opening present with no more evidence of incision opening in other areas.  Minimal amount of serous drainage from the incision.  Lower portion of the incision under the pannus erythematous but not appearing infected.  Wound bed red/pink.  No evidence of infection.  Wet to dry dressing change performed by the patient's mother without difficulty.    Extremities: No bilateral cyanosis, edema, or clubbing.   Assessment/Plan:  29 year old s/p exploratory laparotomy with supracervical hysterectomy, bilateral salpingoophorectomy, omentectomy, peritoneal stripping, resection of bladder nodule, rectosigmoid resection with mobilization of the splenic flexure and primary end-to-end anastamosis (R2 resection) at [facility] with Dr. Nancy Marus on 08/31/15 for high grade serous carcinoma of the ovary.  She is doing well post-operatively.  Dressing changes to the open incision twice daily.  Return demonstration from patient's mother went well.  Plans for the first cycle of chemotherapy to be given at [facility] on 09/26/15.  She will then be under the care of Dr. Evlyn Clines in [place] with first consultation on September 30.  Her lab work was discussed including incorporating more protein in her diet.  Reportable signs and symptoms reviewed.  Additional wound care supplies given.   She is advised to call for any questions or concerns.   CROSS, MELISSA DEAL, NP 09/27/2015, 4:02 PM

## 2015-09-30 ENCOUNTER — Telehealth: Payer: Self-pay | Admitting: Oncology

## 2015-09-30 ENCOUNTER — Other Ambulatory Visit: Payer: Self-pay | Admitting: *Deleted

## 2015-09-30 ENCOUNTER — Ambulatory Visit (HOSPITAL_BASED_OUTPATIENT_CLINIC_OR_DEPARTMENT_OTHER): Payer: BC Managed Care – PPO | Admitting: Oncology

## 2015-09-30 ENCOUNTER — Other Ambulatory Visit (HOSPITAL_BASED_OUTPATIENT_CLINIC_OR_DEPARTMENT_OTHER): Payer: BC Managed Care – PPO

## 2015-09-30 VITALS — BP 140/93 | HR 131 | Temp 98.4°F | Resp 18 | Ht 65.0 in | Wt 233.0 lb

## 2015-09-30 DIAGNOSIS — T8131XD Disruption of external operation (surgical) wound, not elsewhere classified, subsequent encounter: Secondary | ICD-10-CM

## 2015-09-30 DIAGNOSIS — C562 Malignant neoplasm of left ovary: Secondary | ICD-10-CM

## 2015-09-30 DIAGNOSIS — C7989 Secondary malignant neoplasm of other specified sites: Secondary | ICD-10-CM | POA: Diagnosis not present

## 2015-09-30 DIAGNOSIS — C561 Malignant neoplasm of right ovary: Secondary | ICD-10-CM | POA: Diagnosis not present

## 2015-09-30 DIAGNOSIS — Z95828 Presence of other vascular implants and grafts: Secondary | ICD-10-CM

## 2015-09-30 DIAGNOSIS — E669 Obesity, unspecified: Secondary | ICD-10-CM

## 2015-09-30 DIAGNOSIS — R112 Nausea with vomiting, unspecified: Secondary | ICD-10-CM

## 2015-09-30 DIAGNOSIS — J910 Malignant pleural effusion: Secondary | ICD-10-CM | POA: Diagnosis not present

## 2015-09-30 DIAGNOSIS — C569 Malignant neoplasm of unspecified ovary: Secondary | ICD-10-CM

## 2015-09-30 DIAGNOSIS — R55 Syncope and collapse: Secondary | ICD-10-CM

## 2015-09-30 DIAGNOSIS — T451X5A Adverse effect of antineoplastic and immunosuppressive drugs, initial encounter: Secondary | ICD-10-CM

## 2015-09-30 DIAGNOSIS — E8940 Asymptomatic postprocedural ovarian failure: Secondary | ICD-10-CM

## 2015-09-30 LAB — IRON AND TIBC CHCC
%SAT: 37 % (ref 21–57)
IRON: 105 ug/dL (ref 41–142)
TIBC: 283 ug/dL (ref 236–444)
UIBC: 178 ug/dL (ref 120–384)

## 2015-09-30 LAB — COMPREHENSIVE METABOLIC PANEL (CC13)
ALBUMIN: 3.2 g/dL — AB (ref 3.5–5.0)
ALK PHOS: 88 U/L (ref 40–150)
ALT: 26 U/L (ref 0–55)
ANION GAP: 11 meq/L (ref 3–11)
AST: 34 U/L (ref 5–34)
BUN: 15.1 mg/dL (ref 7.0–26.0)
CALCIUM: 9.5 mg/dL (ref 8.4–10.4)
CO2: 23 mEq/L (ref 22–29)
CREATININE: 0.6 mg/dL (ref 0.6–1.1)
Chloride: 103 mEq/L (ref 98–109)
EGFR: >90 mL/min/{1.73_m2} (ref >90)
Glucose: 105 mg/dl (ref 70–140)
Potassium: 3.9 mEq/L (ref 3.5–5.1)
Sodium: 136 mEq/L (ref 136–145)
Total Bilirubin: 0.46 mg/dL (ref 0.20–1.20)
Total Protein: 7.3 g/dL (ref 6.4–8.3)

## 2015-09-30 LAB — CBC WITH DIFFERENTIAL/PLATELET
BASO%: 0.2 % (ref 0.0–2.0)
BASOS ABS: 0 10*3/uL (ref 0.0–0.1)
EOS%: 3.2 % (ref 0.0–7.0)
Eosinophils Absolute: 0.2 10*3/uL (ref 0.0–0.5)
HEMATOCRIT: 35.1 % (ref 34.8–46.6)
HEMOGLOBIN: 11 g/dL — AB (ref 11.6–15.9)
LYMPH#: 1.2 10*3/uL (ref 0.9–3.3)
LYMPH%: 22 % (ref 14.0–49.7)
MCH: 26.3 pg (ref 25.1–34.0)
MCHC: 31.3 g/dL — AB (ref 31.5–36.0)
MCV: 84 fL (ref 79.5–101.0)
MONO#: 0.1 10*3/uL (ref 0.1–0.9)
MONO%: 1.1 % (ref 0.0–14.0)
NEUT#: 4.1 10*3/uL (ref 1.5–6.5)
NEUT%: 73.5 % (ref 38.4–76.8)
PLATELETS: 418 10*3/uL — AB (ref 145–400)
RBC: 4.18 10*6/uL (ref 3.70–5.45)
RDW: 15.7 % — AB (ref 11.2–14.5)
WBC: 5.6 10*3/uL (ref 3.9–10.3)

## 2015-09-30 MED ORDER — LIDOCAINE-PRILOCAINE 2.5-2.5 % EX CREA: 1.0000 "application " | TOPICAL_CREAM | CUTANEOUS | Status: DC | PRN

## 2015-09-30 MED ORDER — LIDOCAINE-PRILOCAINE 2.5-2.5 % EX CREA: 1.0000 "application " | TOPICAL_CREAM | CUTANEOUS | Status: AC | PRN

## 2015-09-30 MED ORDER — DEXAMETHASONE 4 MG PO TABS
ORAL_TABLET | ORAL | Status: DC
Start: 2015-09-30 — End: 2015-10-27

## 2015-09-30 MED ORDER — HYDROMORPHONE HCL 2 MG PO TABS: ORAL_TABLET | ORAL | Status: DC

## 2015-09-30 NOTE — Telephone Encounter (Signed)
Gave patient avs report and appointments for October. Patient aware of time lapse between ched class and lab/gynonc appointment on 10/5. Also labs does not absolutely have to be drawn from port per pt - no flush added for 10/10 - flush schedules booked.

## 2015-09-30 NOTE — Progress Notes (Signed)
Sheena Miles NEW PATIENT EVALUATION   Name: Sheena Miles Date: September 30, 2015  MRN: [id number] DOB: [birth year]  REFERRING PHYSICIAN: Nancy Miles cc Miles, Kirsten, MD (PCP [facility]) , Sheena Miles   REASON FOR REFERRAL: stage IV carcinoma of bilateral ovaries, to continue adjuvant chemotherapy begun 09-26-15 at [facility].  Outside records from [facility] reviewed in detail, including operative note, pathology, multidisciplinary conference note, CT angio, labs, PAC, chemotherapy information, will be scanned into this EMR as not all yet available in [place].   HISTORY OF PRESENT ILLNESS:Sheena Miles is a 29 y.o. female who is seen in consultation, together with mother, at the request of Dr Sheena Miles , to continue adjuvant treatment of recently diagnosed bilateral ovarian cancer. She had surgery by Dr Sheena Miles at [facility] on 09-06-15, wound dehiscence improving with packing now, and first carboplatin taxol at [facility] on 09-26-15 to expedite treatment.   Patient initially developed constipation and abdominal pain which she thought was stress related, then worsening abdominal and pelvic pain, some back pain and some fever. She had gained ~ 30 lbs in last 5-6 months. She was seen at [facility] with concern for acute appendicitis. She was evaluated at local ED, I believe [facility], with CT reportedly showing large left and small right pleural effusions, moderate ascites, complex septated cystic mass in low pelvis into left abdomen 13 x 7 x 7 cm, adenopathy  Including large diaphragmatic lymph nodes anterior to heart and numerous mesenteric nodes, and apparent carcinomatosis with implants in omentum up to 3.8 cm. She was seen by Dr Sheena Miles and referred to Dr Sheena Miles. At Dr Sheena Miles exam 08-31-15 there was large mass in cul de sac and abdominal fluid wave, decreased BS left lower 1/2 of chest. Lab studies included CA 125 33,145; inhibin B <10; CEA <0.5,  AFP 2.2, LDH 1281 and HCG negative. Surgery by Dr Sheena Miles at [facility] on 09-06-15 was exploratory laparotomy with supracervical hysterectomy, BSO, omentectomy, resection with primary reanastomosis of rectosigmoid and peritoneal stripping. Intraoperative findings included 3 liters of bloody ascites, 12 cm omental mass adherent to uterus, sigmoid colon and rectum which was resected en bloc, bladder nodule resected. At completion of surgery there was residual tumor along entirety of small bowel mesentery with multiple nodules up to 1.5 cm, miliary disease on diaphragm, and peritoneal nodularity along pelvis and rectum (R2 resection).  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 No nodes submitted.  [facility] discharge summary is not presently available, however she was transfused 2 units PRBCs on each 9-9 and 09-13-15 for hemoglobin as low as 6.8 - 7.1. She had urgent right thoracentesis for 750 cc on 09-08-15 with cytology positive for high grade serous carcinoma ([id number]) and left thoracentesis for 400 cc on 09-09-15. CT angio chest on POD 1 was negative for PE. She tolerated morphine by PCA post operatively. She had mils cellulitis at abdominal incision on day of DC, begun on Keflex then. Psychiatry saw in hospital, begun on zoloft. She was discharged home with 28 days of lovenox. She saw Dr Sheena Miles on 09-19-15 with staples removed, however wound opened that evening. She was seen by gyn onc provider on 09-23-15, with wound open 3 x 5 cm, 3 cm deep, no evidence of infection. Wet to dry dressings bid were begun, ongoing. She had follow up wound check on 09-27-15, no findings of concern.  GOG protocol at [facility] had delay, so that treatment given off protocol with first cycle of carboplatin taxol at [facility] on 09-26-15, with carboplatin 900 mg total dose, taxol at 175 mg/m2 = 399 mg, decadron 12 mg, Aloxi 250  mg. EMEND 150 mg, benadryl 25 mg and pepcid 40 mg. She did not take oral decadron premedication for the taxol. She had severe, generalized aching beginning night of day 3 thru day 4, but has nearly resolved today (day 5). Heating pad helped aches as did increase in dilaudid from previous 2 mg ~ once daily to 4 mg every 4 hrs; she did use claritin daily. She has had some nausea and is particularly unable to tolerate food odors tho taste is ok.. She is drinking fluids including protein shakes 2 daily.. She has had no fever. She is washing the wound daily in shower and mother is packing bid, seems to be improving, little drainage tho some bleeding with dressing changes. She uses abdominal binder during day. Bowels are moving now, has needed miralax and Phillips prn, not using colace now. Last BM was last night, soft formed. No bleeding on lovenox, one week left, father (who is diabetic) is giving injections. Bladder ok. SOB climbing stairs but not as severe as prior to thoracenteses. Severe hot flashes, ativan seems to help at hs, understands that Dr Sheena Miles prefers no estrogen now. PAC not uncomfortable,, worked well for first chemo. No peripheral neuropathy.  She had some chemo teaching at [facility] but would like to have full chemo education class at [facility].   Power PAC placed at [facility] 09-09-15 Needs flu vaccine  Genetics referral in process  Pain 1-2  REVIEW OF SYSTEMS as above, also: Some HA this week, possibly sinus as she tends to have allergies in fall. No bleeding. No LE swelling. No GERD since begun on Prilosec in hospital (severe in hospital). No arthritis. No difficulty hearing. No dental problems. Good visual acuity without glasses.   Remainder of full 10 point review of systems negative.   ALLERGIES: Codeine  PAST MEDICAL/ SURGICAL HISTORY:    G45 Menarche age 24, irregular cycles Last PAP 2 yrs ago Wisdom teeth out Exercise induced asthma, used inhaler fall 2015  CURRENT MEDICATIONS: reviewed  as listed now in EMR. Per her pharmacy, dilaudid last filled for #30 of 2 mg tablets on 09-22-15 "has 3 left" per mother, see usage with taxol aches. Refilled dilaudid. EMLA, decadron as premed for taxol.   [place]   [place]   SOCIAL HISTORY:  From [employer] where she lives, parents also in [place]. Legally separated from husband in 06-2015. 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 Social ETOH, no tobacco.  She is out of work x 6 weeks from surgery, would like to return to teaching but understands that this will depend on recovery and tolerance of chemotherapy.   FAMILY HISTORY:  Paternal grandmother with breast cancer ~ age 53 - 30 Father DM, CHF Mother healthy Maternal grandmother Crohn's 2 sisters, 1 brother healthy No other known cancer in family       PHYSICAL EXAM:  height is 5' 5"  (1.651 m) and weight is 233 lb (105.688 kg). Her oral temperature is 98.4 F (36.9 C). Her blood pressure is 140/93 and her pulse is 131. Her respiration is 18 and oxygen saturation is 98%.  BMI 38.9 Alert, extremely pleasant, cooperative lady looks stated age. Appears somewhat uncomfortable, frequently standing. Somewhat pale and diaphoretic  which seems to be hot flashes. Excellent historian, mother very supportive. No alopecia. Respirations not labored RA  HEENT:normal hair pattern. PERRL, not icteric. Oral mucosa a little dry, tongue slight coating without frank thrush, posterior pharynx clear.Neck supple without JVD or thyroid mass  RESPIRATORY: diminished BS just in bases, minimal crackles in bases, no wheezes or rales, no use of accessory muscles. Not dull to percussion  CARDIAC/ VASCULAR: heart RRR no gallop, clear heart sounds. Peripheral pulses symmetrical and intact  ABDOMEN: obese, soft, some BS, not tender. Abdominal wound examined with gyn onc staff, minimal serosanguinous fluid on dressing, healthy pink granulation tissue with some  bleeding, suture at bottom of wound, no surrounding erythema. No tracking at top of open wound and depth less per gyn onc.  LYMPH NODES: no cervical, supraclavicular, axillary or inguinal adenopathy  BREASTS:bilaterally without dominant mass, skin or nipple findings  NEUROLOGIC: CN, motor, sensory, cerebellar nonfocal. PSYCH appropriate mood and affect  SKIN: moist, no rash, ecchymosis, petechiae.  MUSCULOSKELETAL: back not tender. LE no pitting edema, cords, tenderness    LABORATORY DATA:  Results for orders placed or performed in visit on 09/30/15 (from the past 48 hour(s))  CBC with Differential     Status: Abnormal   Collection Time: 09/30/15  9:09 AM  Result Value Ref Range   WBC 5.6 3.9 - 10.3 10e3/uL   NEUT# 4.1 1.5 - 6.5 10e3/uL   HGB 11.0 (L) 11.6 - 15.9 g/dL   HCT 35.1 34.8 - 46.6 %   Platelets 418 (H) 145 - 400 10e3/uL   MCV 84.0 79.5 - 101.0 fL   MCH 26.3 25.1 - 34.0 pg   MCHC 31.3 (L) 31.5 - 36.0 g/dL   RBC 4.18 3.70 - 5.45 10e6/uL   RDW 15.7 (H) 11.2 - 14.5 %   lymph# 1.2 0.9 - 3.3 10e3/uL   MONO# 0.1 0.1 - 0.9 10e3/uL   Eosinophils Absolute 0.2 0.0 - 0.5 10e3/uL   Basophils Absolute 0.0 0.0 - 0.1 10e3/uL   NEUT% 73.5 38.4 - 76.8 %   LYMPH% 22.0 14.0 - 49.7 %   MONO% 1.1 0.0 - 14.0 %   EOS% 3.2 0.0 - 7.0 %   BASO% 0.2 0.0 - 2.0 %  Iron and TIBC [facility]     Status: None   Collection Time: 09/30/15  9:09 AM  Result Value Ref Range   Iron 105 41 - 142 ug/dL   TIBC 283 236 - 444 ug/dL   UIBC 178 120 - 384 ug/dL   %SAT 37 21 - 57 %  Comprehensive metabolic panel (Cmet) - [facility]     Status: Abnormal   Collection Time: 09/30/15  9:10 AM  Result Value Ref Range   Sodium 136 136 - 145 mEq/L   Potassium 3.9 3.5 - 5.1 mEq/L   Chloride 103 98 - 109 mEq/L   CO2 23 22 - 29 mEq/L   Glucose 105 70 - 140 mg/dl    Comment: Glucose reference range is for nonfasting patients. Fasting glucose reference range is 70- 100.   BUN 15.1 7.0 - 26.0 mg/dL   Creatinine 0.6 0.6 - 1.1  mg/dL   Total Bilirubin 0.46 0.20 - 1.20 mg/dL   Alkaline Phosphatase 88 40 - 150 U/L   AST 34 5 - 34 U/L   ALT 26 0 - 55 U/L   Total Protein 7.3 6.4 - 8.3 g/dL   Albumin 3.2 (L) 3.5 - 5.0 g/dL   Calcium 9.5 8.4 - 10.4 mg/dL  Anion Gap 11 3 - 11 mEq/L   EGFR >90 >90 ml/min/1.73 m2    Comment: eGFR is calculated using the CKD-EPI Creatinine Equation (2009)      PATHOLOGY: Non-gynecologic Cytology                          Case: [id number]                                Authorizing Provider:  Burnett Sheng, MD   Collected:           09/09/2015 0653             Ordering Location:     [facility]              Received:            09/09/2015 1205             Pathologist:           Ardith Dark, MD                                                         Specimen:    Thoracentesis, body fluid                                                             Final Diagnosis  Body fluid, thoracentesis: - Positive for malignancy, consistent with involvement by the patient's known history of serous adenocarcinomaNon-gynecologic Cytology                          Case: [id number]                                Authorizing Provider:  Durene Romans, MD  Collected:           09/08/2015 0744             Ordering Location:     [facility] [place]              Received:            09/09/2015 5825             Pathologist:           Ardith Dark, MD                                                         Specimen:    Pleural fluid, body fluid  Final Diagnosis  Pleural fluid, thoracentesis: - Positive for malignancy consistent with involvement by the patient's known high grade serous carcinoma - See comment   Report  Surgical pathology exam9/06/2015  [facility]  Component Name Value Range  Case Report Surgical Pathology Report                         Case: [id number]                                Authorizing Provider:  Burnett Sheng, MD   Collected:           09/06/2015 1250             Ordering Location:     [facility] [place]          Received:            09/06/2015 1303             Pathologist:           Kerrie Buffalo, MD                                                           Specimens:   A) - Omentum, omentum                                                                              B) - Uterus, uterus, cervix bilateral tubes , ovaries  and colon                                   C) - Other-enter as order comment, anastomotic rings x 2                                Final Diagnosis FSA, A:  Omentum, partial omentectomy - Extensive metastatic serous carcinoma   B:  Uterus, cervix, bilateral tubes and ovaries and colon, hysterectomy, bilateral salpingo-oophorectomy and partial colectomy - High grade serous carcinoma, see synoptic report below    C:  Colon, anastomotic rings, excision  - Colonic mucosa and wall negative for malignancy    Synoptic Report OVARY: Oophorectomy, Salpingo-Oophorectomy, Subtotal Oophorectomy or Removal of Tumor in Fragments, Hysterectomy with Salpingo-Oophorectomy OVARY: OOPHORECTOMY - B  SPECIMEN  Procedure:    Bilateral salpingo-oophorectomy  Procedure:    Supracervical hysterectomy  Procedure:    Hysterectomy  Procedure:    Other (specify): Partial Colectomy, Partial Omentectomy  Lymph Node Sampling:    Not performed  Right ovary:    Fragmented  Left ovary:    Fragmented  Morcellated Specimen:    Fragmented  Primary Tumor Site:    Left ovary  TUMOR  Histologic Type:    Serous, carcinoma  Histologic Grade      World Health Organization (  WHO) Grading System (applies to all carcinomas, including serous carcinomas):    G2: Moderately differentiated      Two-Tier Grading System (may be applied to serous carcinomas and immature teratomas only):    High grade  EXTENT  Tumor Size:    Right Ovary Tumor Size    Tumor Size - Right Ovary:    Greatest dimension (cm): 6.0       Additional Dimension (cm):    3      Additional Dimension (cm):    1.5  Tumor Size:    Left Ovary Tumor Size    Tumor Size - Left Ovary:    Greatest dimension (cm): 9.5      Additional Dimension (cm):    7      Additional Dimension (cm):    3.5  Ovarian Surface Involvement:    Present  Implants (only applies to advanced stage serous / seromucinous borderline tumors)    Noninvasive Implant(s):    Not present    Invasive Implant(s):    Present      Specify Site(s):    Colonic Wall, Omentum, Uterine Wall  Organs Submitted:    Right ovary    Right Ovary - Extent:    Involved  Organs Submitted:    Left ovary    Left Ovary - Extent:    Involved  Organs Submitted:    Right fallopian tube    Right Fallopian Tube - Extent:    Involved  Organs Submitted:    Left fallopian tube    Left Fallopian Tube - Extent:    Involved  Organs Submitted:    Uterus    Uterus - Extent:    Involved (specify location): Uterine wall, posterior and anterior corpus and lower uterine segment  Organs Submitted:    Omentum    Omentum - Extent:    Involved  Organs Submitted:    Other organs / tissues (specify): Colon    Other - Extent:    Involved  ACCESSORY FINDINGS  Lymph-Vascular Invasion:    Present  SPECIAL STUDIES  Ancillary Studies  (specify):    see ancillary studies section  STAGE (pTNM [FIGO])  TNM Descriptors:    Not applicable  Primary Tumor (pT):    pT3c and / or N1 [IIIC]: Peritoneal metastasis beyond pelvis >2 cm in greatest dimension and / or regional lymph node metastasis  Regional Lymph Nodes (pN):    pNX: Cannot be assessed    Status of Regional Lymph Nodes:    No nodes submitted or found  Distant Metastasis (pM):    pM1 [IV]: Distant metastases (excludes peritoneal metastasis)    Specify Site(s), if known:    pleural fluid   Ancillary Studies Extensive immunohistochemical staining was performed on a representative section of tumor with the following results: (+) p16; (-) calretinin, p53,  PLAP, HCG, AFP, ER, PR, chromogranin, inhibin.  Despite negative staining for p53, these results are consistent with serous carcinoma.        RADIOGRAPHY:  Initial outside CT not presently available other than information in other physician notes.     CTA Chest Pe9/07/2015  [facility]  Result Narrative  EXAM: CTA Chest for Pulmonary Embolus DATE: 09/07/15 18:57:29 ACCESSION: [id number] UN DICTATED: 09/07/15 19:21:19 INTERPRETATION LOCATION: [place]  CLINICAL INDICATION: 29 Year Old (F): tachycardia, O2 requirement. History of stage IV ovarian cancer.  COMPARISON: Chest radiograph 09/02/2015.  TECHNIQUE: A spiral CTA scan was obtained with IV contrast from the thoracic inlet through the  hemidiaphragms. Images were reconstructed in the axial plane. Multiplanar reformatted and MIP images are provided for further evaluation of the pulmonary vasculature.  FINDINGS:  There are no filling defects within the pulmonary arterial tree to suggest a pulmonary embolism.    Heart size is normal. No pericardial effusion. Normal caliber thoracic aorta.  There is mediastinal lymphadenopathy with prevascular lymph nodes measuring up to 1 cm (10:30) Multiple enlarged right cardiophrenic lymph nodes are noted measuring up to 1.3 cm (10:90). Multiple enlarged internal mammary lymph nodes measure up to 1.2 cm (10:43).  Large bilateral pleural effusions, right greater than left, with associated atelectasis.   Ill-defined, hypoattenuating subcapsular lesions in the posterior right hepatic lobe are incompletely imaged and poorly evaluated on this arterial phase CT, but are concerning for subcapsular metastatic implants (10:119, 141).   There are no lytic or blastic osseous lesions.   IMPRESSION: -- No CT evidence of a pulmonary embolism. -- Large bilateral pleural effusions, right greater than left, with associated atelectasis. -- Extensive mediastinal, left internal mammary, and  diaphragmatic lymphadenopathy concerning for metastatic disease. -- Ill-defined, hypoattenuating subcapsular lesions in the posterior right hepatic lobe which are incompletely imaged and poorly evaluated on this arterial phase CT, but are concerning for subcapsular metastatic implants.    EXAM: CHEST ONE VIEW DATE: 09/09/15 11:34:28 ACCESSION: [id number] UN DICTATED: 09/09/15 12:16:06 INTERPRETATION LOCATION: [place]  CLINICAL INDICATION: 29 Year Old (F):  - . DYSPNEA    COMPARISON: 09/09/15 at 6:39 AM, and earlier.  TECHNIQUE: Portable AP semiupright view of the chest.  CONCLUSIONS:  1. Interval placement right IJ Port-A-Cath with tip in inferior SVC.  2. Bilateral pleural effusions unchanged (partially loculated on the right). No pneumothoraces.  3. Unchanged bibasilar opacities and cardiomediastinal silhouette.      DISCUSSION: All of above history reviewed with patient and mother. They are most appreciative of Dr Sheena Miles care, with appointment back to her in [place] on 10-05-15,  and want to continue chemotherapy in [place]. Will check blood counts when she is at [facility] for Dr Sheena Miles visit on 10-05-15, as she may need gCSF then. I have requested preauthorization of granix by managed care, this due to timing of possible medication need this first cycle and recognizing severity of taxol aches ~ day 4. We have discussed treatment schedule of chemotherapy every 3 weeks. Will set up chemotherapy education class at [facility] prior to cycle 2. She understands that blood counts may drop in next week, tho WBC and platelets are good today. We have discussed diet, activity, hot flashes, medications.  Genetics referral sent again now. Needs flu vaccine not on day of chemo and not if neutropenic, between now and ~ end of Oct. Reviewed chemo side effects including nausea, smell and taste changes, aches, peripheral neuropathy, cytopenias.     IMPRESSION / PLAN:  I.IVA high grade serous  carcinoma of bilateral ovaries with extensive disease in abdomen and pelvis such that optimal debulking could not be accomplished at surgery [facility] 09-06-15. Plan to continue chemotherapy with carboplatin and taxol, next due on 10-17-15 tho she would prefer moving day of treatment to Wed or Thurs given timing of aches and hope to return to teaching school.  Chemo education class. Follow up with Dr Sheena Miles 10-05-15, recheck counts then and add gCSF if needed. Genetics referral. Complete lovenox total 28 days. 2.wound dehiscence: improving, patient and mother instructed by gyn onc staff also today 3.surgical menopause: encouraged her to increase po fluids 4.needs flu vaccine 5.power PAC in: discussed  EMLA 6.recent social stress separating from husband   Verbal consent for chemotherapy.  Patient and mother have had questions answered to their satisfaction and are in agreement with plan above. They can contact this office for questions or concerns at any time prior to next scheduled visit. Cc Drs Sheena Miles, Miles, Isidore Moos and granix orders placed, granix at 480 mcg if needed. Message to managed care for preauthorization. Time spent 90 min, including >50% discussion and coordination of care.    LIVESAY,LENNIS P, MD 09/30/2015 10:54 AM

## 2015-10-02 ENCOUNTER — Encounter: Payer: Self-pay | Admitting: Oncology

## 2015-10-02 DIAGNOSIS — R55 Syncope and collapse: Secondary | ICD-10-CM | POA: Insufficient documentation

## 2015-10-02 DIAGNOSIS — C569 Malignant neoplasm of unspecified ovary: Secondary | ICD-10-CM | POA: Insufficient documentation

## 2015-10-02 DIAGNOSIS — R112 Nausea with vomiting, unspecified: Secondary | ICD-10-CM | POA: Insufficient documentation

## 2015-10-02 DIAGNOSIS — T451X5A Adverse effect of antineoplastic and immunosuppressive drugs, initial encounter: Secondary | ICD-10-CM | POA: Insufficient documentation

## 2015-10-02 DIAGNOSIS — Z95828 Presence of other vascular implants and grafts: Secondary | ICD-10-CM | POA: Insufficient documentation

## 2015-10-02 DIAGNOSIS — E8940 Asymptomatic postprocedural ovarian failure: Secondary | ICD-10-CM | POA: Insufficient documentation

## 2015-10-02 DIAGNOSIS — J910 Malignant pleural effusion: Secondary | ICD-10-CM | POA: Insufficient documentation

## 2015-10-02 DIAGNOSIS — T8130XA Disruption of wound, unspecified, initial encounter: Secondary | ICD-10-CM | POA: Insufficient documentation

## 2015-10-05 ENCOUNTER — Other Ambulatory Visit (HOSPITAL_BASED_OUTPATIENT_CLINIC_OR_DEPARTMENT_OTHER): Payer: BC Managed Care – PPO

## 2015-10-05 ENCOUNTER — Other Ambulatory Visit: Payer: Self-pay | Admitting: Oncology

## 2015-10-05 ENCOUNTER — Ambulatory Visit: Payer: BC Managed Care – PPO | Attending: Gynecologic Oncology | Admitting: Gynecologic Oncology

## 2015-10-05 ENCOUNTER — Encounter: Payer: Self-pay | Admitting: Gynecologic Oncology

## 2015-10-05 ENCOUNTER — Ambulatory Visit (HOSPITAL_BASED_OUTPATIENT_CLINIC_OR_DEPARTMENT_OTHER): Payer: BC Managed Care – PPO

## 2015-10-05 ENCOUNTER — Encounter: Payer: Self-pay | Admitting: Oncology

## 2015-10-05 ENCOUNTER — Encounter: Payer: Self-pay | Admitting: *Deleted

## 2015-10-05 ENCOUNTER — Other Ambulatory Visit: Payer: BC Managed Care – PPO

## 2015-10-05 VITALS — BP 116/76 | HR 99 | Temp 98.1°F | Resp 18 | Ht 65.0 in | Wt 240.7 lb

## 2015-10-05 DIAGNOSIS — C569 Malignant neoplasm of unspecified ovary: Secondary | ICD-10-CM

## 2015-10-05 DIAGNOSIS — C562 Malignant neoplasm of left ovary: Secondary | ICD-10-CM

## 2015-10-05 DIAGNOSIS — C561 Malignant neoplasm of right ovary: Secondary | ICD-10-CM | POA: Diagnosis not present

## 2015-10-05 DIAGNOSIS — Z5189 Encounter for other specified aftercare: Secondary | ICD-10-CM | POA: Diagnosis not present

## 2015-10-05 LAB — CBC WITH DIFFERENTIAL/PLATELET
BASO%: 0.3 % (ref 0.0–2.0)
BASOS ABS: 0 10*3/uL (ref 0.0–0.1)
EOS ABS: 0.1 10*3/uL (ref 0.0–0.5)
EOS%: 3.1 % (ref 0.0–7.0)
HCT: 31.1 % — ABNORMAL LOW (ref 34.8–46.6)
HGB: 9.8 g/dL — ABNORMAL LOW (ref 11.6–15.9)
LYMPH%: 40.9 % (ref 14.0–49.7)
MCH: 26.3 pg (ref 25.1–34.0)
MCHC: 31.5 g/dL (ref 31.5–36.0)
MCV: 83.6 fL (ref 79.5–101.0)
MONO#: 0.6 10*3/uL (ref 0.1–0.9)
MONO%: 17.2 % — AB (ref 0.0–14.0)
NEUT#: 1.3 10*3/uL — ABNORMAL LOW (ref 1.5–6.5)
NEUT%: 38.5 % (ref 38.4–76.8)
Platelets: 432 10*3/uL — ABNORMAL HIGH (ref 145–400)
RBC: 3.72 10*6/uL (ref 3.70–5.45)
RDW: 16.3 % — ABNORMAL HIGH (ref 11.2–14.5)
WBC: 3.3 10*3/uL — ABNORMAL LOW (ref 3.9–10.3)
lymph#: 1.3 10*3/uL (ref 0.9–3.3)

## 2015-10-05 LAB — BASIC METABOLIC PANEL (CC13)
Anion Gap: 10 mEq/L (ref 3–11)
BUN: 8.9 mg/dL (ref 7.0–26.0)
CALCIUM: 9.7 mg/dL (ref 8.4–10.4)
CO2: 26 meq/L (ref 22–29)
CREATININE: 0.7 mg/dL (ref 0.6–1.1)
Chloride: 105 mEq/L (ref 98–109)
EGFR: >90 mL/min/{1.73_m2} (ref >90)
Glucose: 96 mg/dl (ref 70–140)
Potassium: 4.2 mEq/L (ref 3.5–5.1)
SODIUM: 140 meq/L (ref 136–145)

## 2015-10-05 MED ORDER — TBO-FILGRASTIM 480 MCG/0.8ML ~~LOC~~ SOSY
480.0000 ug | PREFILLED_SYRINGE | Freq: Once | SUBCUTANEOUS | Status: AC
  Administered 2015-10-05: 480 ug via SUBCUTANEOUS
  Filled 2015-10-05: qty 0.8

## 2015-10-05 NOTE — Progress Notes (Signed)
Follow Up Note: Gyn-Onc  Sheena Miles 29 y.o. female  CC:  Chief Complaint  Patient presents with  . Ovarian Cancer    Post OP follow up    HPI:  Sheena Miles is a 29 year old female, gravida 0, initially referred by Dr. [name] for a newly diagnosed ovarian mass.  She had irregular cycles for many years and went through menarche at the age of 23.  She was experiencing a lot of stress as she had a difficult relationship with her husband and she left him in June of this year. She began experiencing some pain in her shoulder, abdomen, leg and began having some irregular bowel movements which she felt related to stress.  She sought care at an Urgent Care for her moderate anxiety.  At that time, an exam was performed that revealed some tenderness in the right lower quadrant. She also had a low-grade temperature and was referred to the emergency room to rule out appendicitis or cholecystitis. She reports her abdomen was getting "hard" in early August with intermittent back pain. She also began experiencing pelvic pain over the past 2 weeks with intermittent nausea/vomiting that started in June. She's gained approximately 30 pounds in the last 5-6 months and is been having increasing fatigue.  In the emergency room, her CT scan revealed: She had a small right and large left sided pleural effusion with mild underlying atelectasis bilaterally. The gallbladder appeared normal. There was some ascites with a fluid collection in the area of the falciform ligament measuring 4 x 3 cm. There was another lentiform collection with a similar attenuation. Overall she had moderate volume ascites throughout the abdomen. The pancreas, spleen, adrenals, kidneys, stomach, bowel were unremarkable. There are large diaphragmatic lymph nodes anterior to the heart measuring up to 1.3 cm. There are numerous small mesenteric lymph nodes present. The uterus and ovaries were not discretely identified. There is a multi-cystic  septated complex mass arising out of the low pelvis and extending into the left abdomen. It measured 13 x 7 x 7 cm. A prominently had cystic component along the cranial edge along the caudal edge of the abnormality. There is infiltration and probable carcinomatosis of the omentum. There were numerous omental implants with the largest measuring 3.8 cm.   She was initially seen on 08/31/15.  Lab work resulted: CA 125 33145, LDH 663, Inhibin B <10, CEA <0.5, AFP 2.2. On 09/06/15, she underwent an exploratory laparotomy with supracervical hysterectomy, bilateral salpingoophorectomy, omentectomy, peritoneal stripping, resection of bladder nodule, rectosigmoid resection with mobilization of the splenic flexure and primary end-to-end anastamosis (R2 resection). Operative findings included: 1. 3L of bloody ascites on abdominal entry  2. 12cm omental mass adherent to the colon, pelvic mass, anterior abdominal wall - resected and frozen with serous cancer  3. Bilateral adnexal masses densely adherent to the uterus, sigmoid colon, and rectum - resected en bloc  4. Pelvic peritoneal nodularity  5. 3cm Bladder nodule - resected  6. Residual tumor along the entirely of the small bowel mesentery with multiple nodules up to 1.5cm in size, miliary disease of the diaphragm, and peritoneal nodularity along the pelvis and rectum (R2)  7. Normal appearing gallbladder, liver, spleen, and stomach  8. Negative bubble test following primary anastamosis of the recto-sigmoid with two donuts removed following anastamosis  Final pathology revealed: Final Diagnosis  FSA, A: Omentum, partial omentectomy  - Extensive metastatic serous carcinoma  B: Uterus, cervix, bilateral tubes and ovaries and colon, hysterectomy, bilateral salpingo-oophorectomy  and partial colectomy  - High grade serous carcinoma, see synoptic report below  C: Colon, anastomotic rings, excision  - Colonic mucosa and wall negative for malignancy   Port a  cath was [facility] at [facility] on 09/09/15.       Interval History:  She had her first cycle of chemotherapy on September 26. At that time her CA-125 was down to 9620. She tolerated the chemotherapy quite well with the associated fatigue and joint pain. She had a chemotherapy class today. She has her next cycle of chemotherapy arranged with Dr. Marko Plume as well as her genetics appointment. She comes in today for wound check.   Current Meds:  Outpatient Encounter Prescriptions as of 10/05/2015  Medication Sig  . dexamethasone (DECADRON) 4 MG tablet Take 5 tablets with food 12 hours and 6 hours prior to Taxol chemotherapy  . docusate sodium (COLACE) 100 MG capsule Take 100 mg by mouth as needed.   Marland Kitchen HYDROmorphone (DILAUDID) 2 MG tablet 1 tablet every 4-6 hours as needed for pain  . ibuprofen (ADVIL,MOTRIN) 600 MG tablet Take 600 mg by mouth as needed.   . lidocaine-prilocaine (EMLA) cream Apply 1 application topically as needed.  Marland Kitchen LORazepam (ATIVAN) 0.5 MG tablet Take 1 tablet (0.5 mg total) by mouth every 8 (eight) hours as needed for anxiety or sleep.  . Multiple Vitamins-Minerals (MULTI-VITAMIN GUMMIES) CHEW Chew by mouth daily.  Marland Kitchen omeprazole (PRILOSEC) 10 MG capsule Take 10 mg by mouth daily.   . Probiotic Product (PROBIOTIC DAILY) CAPS Take 1 capsule by mouth daily.  . prochlorperazine (COMPAZINE) 10 MG tablet Take 10 mg by mouth every 6 (six) hours as needed.   . simethicone (MYLICON) 80 MG chewable tablet Chew 80 mg by mouth as needed.   . triamcinolone (NASACORT ALLERGY 24HR) 55 MCG/ACT AERO nasal inhaler Place 2 sprays into the nose daily.  . [DISCONTINUED] enoxaparin (LOVENOX) 40 MG/0.4ML injection Inject 40 mg into the skin.  . fexofenadine (ALLEGRA) 60 MG tablet Take 60 mg by mouth daily.  . ondansetron (ZOFRAN) 4 MG tablet Take 4 mg by mouth.  . ondansetron (ZOFRAN-ODT) 4 MG disintegrating tablet Take 4 mg by mouth every 6 (six) hours as needed for nausea or vomiting.  Marland Kitchen  oxyCODONE-acetaminophen (PERCOCET/ROXICET) 5-325 MG per tablet Take 1-2 tablets by mouth every 4 (four) hours as needed for severe pain.   No facility-administered encounter medications on file as of 10/05/2015.    Allergy:  Allergies  Allergen Reactions  . Codeine Palpitations    Described by patient as "horrible panic attack feeling and shortness of breath"    Social Hx:   Social History   Social History  . Marital Status: Legally Separated    Spouse Name: N/A  . Number of Children: N/A  . Years of Education: N/A   Occupational History  . Not on file.   Social History Main Topics  . Smoking status: Never Smoker   . Smokeless tobacco: Not on file  . Alcohol Use: Yes     Comment: 1-2 nights week socially  . Drug Use: No  . Sexual Activity: No   Other Topics Concern  . Not on file   Social History Narrative    Past Surgical Hx:  Past Surgical History  Procedure Laterality Date  . Wisdom tooth extraction  2013    Past Medical Hx:  Past Medical History  Diagnosis Date  . Allergy   . Asthma     exercise induced    Family  Hx:  Family History  Problem Relation Age of Onset  . Diabetes Father   . Congestive Heart Failure Father   . Crohn's disease Maternal Grandmother   . Breast cancer Paternal Grandmother     Vitals:  Blood pressure 116/76, pulse 99, temperature 98.1 F (36.7 C), temperature source Oral, resp. rate 18, height 5\' 5"  (1.651 m), weight 240 lb 11.2 oz (109.181 kg), SpO2 100 %.  Physical Exam:  General: Well developed, well nourished female in no acute distress. Alert and oriented x 3.   Abdomen: Abdomen soft, non-tender and obese. Active bowel sounds in all quadrants. 3 cm x 5 cm with 1 cm depth opening present with no more evidence of incision opening in other areas.  No drainage from the incision. Wound bed red/pink.  No evidence of infection.  Wet to dry dressing change performed without difficulty.    Extremities: No bilateral cyanosis,  edema, or clubbing.    Assessment/Plan:  29 year old s/p exploratory laparotomy with supracervical hysterectomy, bilateral salpingoophorectomy, omentectomy, peritoneal stripping, resection of bladder nodule, rectosigmoid resection with mobilization of the splenic flexure and primary end-to-end anastamosis (R2 resection)  on 08/31/15 for high grade serous carcinoma of the ovary.  She is doing well post-operatively.  Dressing changes to the open incision twice daily.  First cycle of chemotherapy on 09/26/15.  She scheduled for her next cycle on October 13 will come in for wound check at that time. She will take Claritin and ibuprofen prior to her next cycle of chemotherapy. Sheena Miles A., NP 10/05/2015, 3:03 PM

## 2015-10-05 NOTE — Patient Instructions (Signed)

## 2015-10-05 NOTE — Progress Notes (Signed)
Pt had chemo ed class today. Asked Rn to have pt come see me afterwards. Introduced myself to the patient as her Estate manager/land agent. Gave her on overview of the services we provide.  Pt has financial concerns such as gas cards because she lives in [place].Approved pt for the [facility] $400 grant. Copy given to pt of approval letter. Also advised pt if there are foundations that she may qualify for, I will reach out to her as soon as I am aware, so that we may apply. Pt verbalized understanding and was very appreciative.

## 2015-10-06 ENCOUNTER — Telehealth: Payer: Self-pay

## 2015-10-06 NOTE — Telephone Encounter (Signed)
-----   Message from Gordy Levan, MD sent at 10/05/2015  2:44 PM EDT ----- ANC 1.3 on 10-5 (= day 10 cycle 1 carbo taxol) Gave granix 480 on 10-5 She may need more before I see her on 10-10  RN please call to check on her 10-6 or AM 10-7. If very tired or other concerns, would be best to get her back for CBC and likely will need to repeat granix  thanks

## 2015-10-06 NOTE — Telephone Encounter (Signed)
Ms. Hosman stated that she rested today. She did have some lower back aches from the granix but nothing like the Taxol aches.  She is feeling better today.  Afebrile. She stated that she went to a local football game.  "I did not hug anyone".  She know to call if she is very tired or runs a temp of 100.5 or higher.  She can go to the ED over the week end if she has a temperature. " I have my chemo alert card." Ms Firmin appreciated the follow up call.

## 2015-10-07 ENCOUNTER — Telehealth: Payer: Self-pay

## 2015-10-07 NOTE — Telephone Encounter (Signed)
LM for Sheena Miles requesting that she call back to talk with this RN to set up a lab appointment this afternoon to follow up on CBC.

## 2015-10-07 NOTE — Telephone Encounter (Signed)
LM on both home and cell phone.  No return call from Ms. Crail to schedule appointment.

## 2015-10-09 ENCOUNTER — Other Ambulatory Visit: Payer: Self-pay | Admitting: Oncology

## 2015-10-09 DIAGNOSIS — C569 Malignant neoplasm of unspecified ovary: Secondary | ICD-10-CM

## 2015-10-10 ENCOUNTER — Other Ambulatory Visit (HOSPITAL_BASED_OUTPATIENT_CLINIC_OR_DEPARTMENT_OTHER): Payer: BC Managed Care – PPO

## 2015-10-10 ENCOUNTER — Ambulatory Visit (HOSPITAL_BASED_OUTPATIENT_CLINIC_OR_DEPARTMENT_OTHER): Payer: BC Managed Care – PPO | Admitting: Oncology

## 2015-10-10 ENCOUNTER — Encounter: Payer: Self-pay | Admitting: Oncology

## 2015-10-10 VITALS — BP 113/73 | HR 103 | Temp 98.6°F | Resp 18 | Ht 65.0 in | Wt 239.3 lb

## 2015-10-10 DIAGNOSIS — Z95828 Presence of other vascular implants and grafts: Secondary | ICD-10-CM

## 2015-10-10 DIAGNOSIS — R112 Nausea with vomiting, unspecified: Secondary | ICD-10-CM

## 2015-10-10 DIAGNOSIS — E8940 Asymptomatic postprocedural ovarian failure: Secondary | ICD-10-CM

## 2015-10-10 DIAGNOSIS — Z23 Encounter for immunization: Secondary | ICD-10-CM

## 2015-10-10 DIAGNOSIS — C561 Malignant neoplasm of right ovary: Secondary | ICD-10-CM | POA: Diagnosis not present

## 2015-10-10 DIAGNOSIS — C562 Malignant neoplasm of left ovary: Secondary | ICD-10-CM

## 2015-10-10 DIAGNOSIS — C569 Malignant neoplasm of unspecified ovary: Secondary | ICD-10-CM

## 2015-10-10 DIAGNOSIS — D701 Agranulocytosis secondary to cancer chemotherapy: Secondary | ICD-10-CM

## 2015-10-10 DIAGNOSIS — T451X5A Adverse effect of antineoplastic and immunosuppressive drugs, initial encounter: Secondary | ICD-10-CM

## 2015-10-10 DIAGNOSIS — T8131XD Disruption of external operation (surgical) wound, not elsewhere classified, subsequent encounter: Secondary | ICD-10-CM

## 2015-10-10 LAB — CBC WITH DIFFERENTIAL/PLATELET
BASO%: 0.8 % (ref 0.0–2.0)
Basophils Absolute: 0.1 10*3/uL (ref 0.0–0.1)
EOS ABS: 0.1 10*3/uL (ref 0.0–0.5)
EOS%: 1.2 % (ref 0.0–7.0)
HEMATOCRIT: 34.4 % — AB (ref 34.8–46.6)
HEMOGLOBIN: 11.2 g/dL — AB (ref 11.6–15.9)
LYMPH#: 2 10*3/uL (ref 0.9–3.3)
LYMPH%: 24.1 % (ref 14.0–49.7)
MCH: 26.8 pg (ref 25.1–34.0)
MCHC: 32.6 g/dL (ref 31.5–36.0)
MCV: 82.2 fL (ref 79.5–101.0)
MONO#: 1 10*3/uL — AB (ref 0.1–0.9)
MONO%: 12.8 % (ref 0.0–14.0)
NEUT%: 61.1 % (ref 38.4–76.8)
NEUTROS ABS: 5 10*3/uL (ref 1.5–6.5)
PLATELETS: 328 10*3/uL (ref 145–400)
RBC: 4.19 10*6/uL (ref 3.70–5.45)
RDW: 18.9 % — AB (ref 11.2–14.5)
WBC: 8.1 10*3/uL (ref 3.9–10.3)

## 2015-10-10 LAB — COMPREHENSIVE METABOLIC PANEL (CC13)
ALBUMIN: 3.5 g/dL (ref 3.5–5.0)
ALK PHOS: 113 U/L (ref 40–150)
ALT: 43 U/L (ref 0–55)
ANION GAP: 10 meq/L (ref 3–11)
AST: 27 U/L (ref 5–34)
BUN: 8.5 mg/dL (ref 7.0–26.0)
CO2: 27 mEq/L (ref 22–29)
CREATININE: 0.7 mg/dL (ref 0.6–1.1)
Calcium: 10.2 mg/dL (ref 8.4–10.4)
Chloride: 104 mEq/L (ref 98–109)
Glucose: 83 mg/dl (ref 70–140)
Potassium: 4.6 mEq/L (ref 3.5–5.1)
Sodium: 140 mEq/L (ref 136–145)
TOTAL PROTEIN: 7.5 g/dL (ref 6.4–8.3)

## 2015-10-10 MED ORDER — INFLUENZA VAC SPLIT QUAD 0.5 ML IM SUSY
0.5000 mL | PREFILLED_SYRINGE | INTRAMUSCULAR | Status: AC
  Administered 2015-10-10: 0.5 mL via INTRAMUSCULAR
  Filled 2015-10-10: qty 0.5

## 2015-10-10 MED ORDER — PROCHLORPERAZINE MALEATE 10 MG PO TABS: 10.0000 mg | ORAL_TABLET | Freq: Four times a day (QID) | ORAL | Status: DC | PRN

## 2015-10-10 MED ORDER — HYDROMORPHONE HCL 2 MG PO TABS: ORAL_TABLET | ORAL | Status: DC

## 2015-10-10 MED ORDER — LORAZEPAM 0.5 MG PO TABS: 0.5000 mg | ORAL_TABLET | Freq: Three times a day (TID) | ORAL | Status: DC | PRN

## 2015-10-10 NOTE — Progress Notes (Signed)
OFFICE PROGRESS NOTE   October 10, 2015   Physicians:Paola Lorre Nick, MD (PCP [facility]) , [name]  INTERVAL HISTORY:  Patient is seen, together with mother, in continuing attention to stage IV high grade serous carcinoma of bilateral ovaries, due cycle 2 adjuvant carboplatin taxol on 10-13-15. Cycle 1 was given at [facility]; this will be first chemotherapy at this office. Wound is still being packed. She had granix x1 dose on 10-05-15 (day 10 cycle 1) for ANC down to 1.3, not thought to be at nadir then.   Patient did have some aches following the single granix injection, but has felt better overall otherwise in past week. She did not have repeat CBC prior to today, ANC in good range now.Mother is dressing the surgical wound 2x daily, stitch at inferior extent of incision pulled out and has been removed by gyn onc NP now. Abdominal binder when up is helpful. Bowels have moved daily and hemorrhoid is better with preparation H. She is starting to lose hair, which has been upsetting. Smells are not as unpleasant tho still elicit nausea at times. She denies SOB or increasing abdominal distension. Hot flashes are severe. She completed lovenox last week. She has been more mobile and able to sleep on her side. She felt well enough to be in a wedding in [place] this past weekend.  No significant peripheral neuropathy   Power PAC placed at [facility] 09-09-15 Flu vaccine given today Genetics counseling scheduled for 10-27-15.  ONCOLOGIC HISTORY Patient initially developed constipation and abdominal pain summer 2016 which she thought was stress related, then worsening abdominal and pelvic pain, some back pain and some fever; she had weight gain ~ 30 lbs in prior 5-6 months. She was seen at [facility] with concern for acute appendicitis. She was evaluated at local ED, I believe [facility], with CT reportedly showing large left and small right pleural effusions, moderate ascites,  complex septated cystic mass in low pelvis into left abdomen 13 x 7 x 7 cm, adenopathy Including large diaphragmatic lymph nodes anterior to heart and numerous mesenteric nodes, and apparent carcinomatosis with implants in omentum up to 3.8 cm. She was seen by Dr [name] and referred to Dr Alycia Rossetti. At Dr Elenora Gamma exam 08-31-15 there was large mass in cul de sac and abdominal fluid wave, decreased BS left lower 1/2 of chest. Lab studies included CA 125 33,145; inhibin B <10; CEA <0.5, AFP 2.2, LDH 1281 and HCG negative. Surgery by Dr Alycia Rossetti at [facility] on 09-06-15 was exploratory laparotomy with supracervical hysterectomy, BSO, omentectomy, resection with primary reanastomosis of rectosigmoid and peritoneal stripping. Intraoperative findings included 3 liters of bloody ascites, 12 cm omental mass adherent to uterus, sigmoid colon and rectum which was resected en bloc, bladder nodule resected. At completion of surgery there was residual tumor along entirety of small bowel mesentery with multiple nodules up to 1.5 cm, miliary disease on diaphragm, and peritoneal nodularity along pelvis and rectum (R2 resection). Pathology [facility] [phone] high grade serous carcinoma of bilateral ovaries involving bilateral tubes, uterine wall anterior and posterior and lower uterine segment, omentum, colon. No nodes submitted.  [facility] discharge summary is not presently available, however she was transfused 2 units PRBCs on each 9-9 and 09-13-15 for hemoglobin as low as 6.8 - 7.1. She had urgent right thoracentesis for 750 cc on 09-08-15 with cytology positive for high grade serous carcinoma ([id number]) and left thoracentesis for 400 cc on 09-09-15. CT angio chest on POD  1 was negative for PE. She tolerated morphine by PCA post operatively. She had mils cellulitis at abdominal incision on day of DC, begun on Keflex then. Psychiatry saw in hospital, begun on zoloft. She was discharged home with 28 days of lovenox. She saw Dr Alycia Rossetti on 09-19-15  with staples removed, however wound opened that evening. She was seen by gyn onc provider on 09-23-15, with wound open 3 x 5 cm, 3 cm deep, no evidence of infection. Wet to dry dressings bid were begun, ongoing. She had follow up wound check on 09-27-15, no findings of concern. GOG protocol at [facility] had delay, so that treatment given off protocol with first cycle of carboplatin taxol at [facility] on 09-26-15, with carboplatin 900 mg total dose, taxol at 175 mg/m2 = 399 mg, decadron 12 mg, Aloxi 250 mg. EMEND 150 mg, benadryl 25 mg and pepcid 40 mg. She did not take oral decadron premedication for the taxol. She had severe, generalized aching beginning night of day 3 thru day 4     Review of systems as above, also: Minimal cough and post nasal drainage. No problems with PAC. Remainder of 10 point Review of Systems negative.  Objective:  Vital signs in last 24 hours:  BP 113/73 mmHg  Pulse 103  Temp(Src) 98.6 F (37 C) (Oral)  Resp 18  Ht 5\' 5"  (1.651 m)  Wt 239 lb 4.8 oz (108.546 kg)  BMI 39.82 kg/m2  SpO2 100% Weight up 6 lbs Alert, oriented and appropriate, overall looks more comfortable today. Ambulatory without assistance, needs help sitting up from exam table. Marland KitchenRespirations not labored RA. Not diaphoretic or clammy.  No alopecia  HEENT:PERRL, sclerae not icteric. Oral mucosa moist without lesions, posterior pharynx clear.  Neck supple. No JVD.  Lymphatics:no cervical,supraclavicular adenopathy Resp: clear to auscultation bilaterally and normal percussion bilaterally Cardio: regular rate and rhythm. No gallop. GI: abdomen soft, nontender, not more distended. A few bowel sounds. Surgical incision closed superiorly, open but not as deep inferiorly with pink granulation tissue and easy small bleeding. No surrounding erythema or tenderness. Musculoskeletal/ Extremities: without pitting edema, cords, tenderness Neuro: no peripheral neuropathy. Otherwise nonfocal. PSYCH appropriate mood and  affect Skin without rash, ecchymosis, petechiae Portacath-without erythema or tenderness  Lab Results:  Results for orders placed or performed in visit on 10/10/15  CBC with Differential  Result Value Ref Range   WBC 8.1 3.9 - 10.3 10e3/uL   NEUT# 5.0 1.5 - 6.5 10e3/uL   HGB 11.2 (L) 11.6 - 15.9 g/dL   HCT 34.4 (L) 34.8 - 46.6 %   Platelets 328 145 - 400 10e3/uL   MCV 82.2 79.5 - 101.0 fL   MCH 26.8 25.1 - 34.0 pg   MCHC 32.6 31.5 - 36.0 g/dL   RBC 4.19 3.70 - 5.45 10e6/uL   RDW 18.9 (H) 11.2 - 14.5 %   lymph# 2.0 0.9 - 3.3 10e3/uL   MONO# 1.0 (H) 0.1 - 0.9 10e3/uL   Eosinophils Absolute 0.1 0.0 - 0.5 10e3/uL   Basophils Absolute 0.1 0.0 - 0.1 10e3/uL   NEUT% 61.1 38.4 - 76.8 %   LYMPH% 24.1 14.0 - 49.7 %   MONO% 12.8 0.0 - 14.0 %   EOS% 1.2 0.0 - 7.0 %   BASO% 0.8 0.0 - 2.0 %   CMET available after visit entirely normal, including K 4.6, creat 0.7, protein 7.5, alb 3.5, LFTs WNL CA 125 available after visit 8521, likely still reflecting some increase from recent surgery but lower  than 08-31-15 value 33,145 and 14,065 on 09-21-15.  Studies/Results:  No results found.  Medications: I have reviewed the patient's current medications. She understands times and instructions for premedication decadron for taxol. Flu vaccine given today. Possible granix 10-20-15 depending on counts. NOTE doses and antiemetics used for cycle 1 at [facility] in Onc History above.   DISCUSSION:  interval history reviewed, all pleased that she contnues to recover from surgery and in agreement with #2 chemotherapy as planned 10-13-15, will recheck CBC and Bmet that day. Gyn onc will recheck wound on 10-13-15.  I will see her again on 10-20 and 10-27 with labs, possible granix 10-20.   Assessment/Plan: I.IVA high grade serous carcinoma of bilateral ovaries with extensive disease in abdomen and pelvis such that optimal debulking could not be accomplished at surgery [facility] 09-06-15. Malignant pleural fluid and ascites  at presentation, not clinically reaccumulating as yet. Plan to continue chemotherapy with carboplatin and taxol, next due on 10-17-15 tho she requests moving day of treatment to Wed or Thurs given timing of aches and hope to return to teaching school so that cycle 2 will be on 10-13 if counts remain good that day. Chemo education class done 10-05-15. Genetics testing anticipated.  2.wound dehiscence: improving, evaluated by gyn onc staff also today 3.surgical menopause: encouraged her to increase po fluids 4.flu vaccine given today 5.power PAC in: will use EMLA 6.recent social stress separating from husband and is selling house this week. 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 She and mother may benefit from [facility] support staff now, message to chaplain and SW - thank you 7.smells unpleasant and nausea after chemo, needs good nutrition to heal surgical wound. [place] dietician to see day of chemo - thank you.  All questions answered and patient is in agreement with recommendations and plans. Chemo orders confirmed. Time spent 30 min including >50% counseling and coordination of care. Cc Drs Alycia Rossetti and [facility].   Nailyn Dearinger P, MD   10/10/2015, 10:16 AM

## 2015-10-11 ENCOUNTER — Other Ambulatory Visit: Payer: Self-pay | Admitting: Oncology

## 2015-10-11 DIAGNOSIS — D701 Agranulocytosis secondary to cancer chemotherapy: Secondary | ICD-10-CM | POA: Insufficient documentation

## 2015-10-11 DIAGNOSIS — T451X5A Adverse effect of antineoplastic and immunosuppressive drugs, initial encounter: Secondary | ICD-10-CM

## 2015-10-11 DIAGNOSIS — C569 Malignant neoplasm of unspecified ovary: Secondary | ICD-10-CM

## 2015-10-11 LAB — CA 125: CA 125: 8521 U/mL — ABNORMAL HIGH (ref <35)

## 2015-10-13 ENCOUNTER — Ambulatory Visit: Payer: BC Managed Care – PPO

## 2015-10-13 ENCOUNTER — Ambulatory Visit (HOSPITAL_BASED_OUTPATIENT_CLINIC_OR_DEPARTMENT_OTHER): Payer: BC Managed Care – PPO

## 2015-10-13 ENCOUNTER — Other Ambulatory Visit (HOSPITAL_BASED_OUTPATIENT_CLINIC_OR_DEPARTMENT_OTHER): Payer: BC Managed Care – PPO

## 2015-10-13 VITALS — BP 119/75 | HR 96 | Temp 98.4°F | Resp 18

## 2015-10-13 DIAGNOSIS — C561 Malignant neoplasm of right ovary: Secondary | ICD-10-CM | POA: Diagnosis not present

## 2015-10-13 DIAGNOSIS — C562 Malignant neoplasm of left ovary: Secondary | ICD-10-CM

## 2015-10-13 DIAGNOSIS — Z5111 Encounter for antineoplastic chemotherapy: Secondary | ICD-10-CM | POA: Diagnosis not present

## 2015-10-13 DIAGNOSIS — C569 Malignant neoplasm of unspecified ovary: Secondary | ICD-10-CM

## 2015-10-13 DIAGNOSIS — Z95828 Presence of other vascular implants and grafts: Secondary | ICD-10-CM

## 2015-10-13 LAB — CBC WITH DIFFERENTIAL/PLATELET
BASO%: 0.1 % (ref 0.0–2.0)
BASOS ABS: 0 10*3/uL (ref 0.0–0.1)
EOS ABS: 0 10*3/uL (ref 0.0–0.5)
EOS%: 0 % (ref 0.0–7.0)
HCT: 35.8 % (ref 34.8–46.6)
HGB: 11.4 g/dL — ABNORMAL LOW (ref 11.6–15.9)
LYMPH%: 8.4 % — AB (ref 14.0–49.7)
MCH: 26.7 pg (ref 25.1–34.0)
MCHC: 31.8 g/dL (ref 31.5–36.0)
MCV: 83.8 fL (ref 79.5–101.0)
MONO#: 0.1 10*3/uL (ref 0.1–0.9)
MONO%: 0.7 % (ref 0.0–14.0)
NEUT#: 11 10*3/uL — ABNORMAL HIGH (ref 1.5–6.5)
NEUT%: 90.8 % — AB (ref 38.4–76.8)
PLATELETS: 359 10*3/uL (ref 145–400)
RBC: 4.27 10*6/uL (ref 3.70–5.45)
RDW: 17 % — ABNORMAL HIGH (ref 11.2–14.5)
WBC: 12.1 10*3/uL — ABNORMAL HIGH (ref 3.9–10.3)
lymph#: 1 10*3/uL (ref 0.9–3.3)

## 2015-10-13 LAB — BASIC METABOLIC PANEL (CC13)
ANION GAP: 13 meq/L — AB (ref 3–11)
BUN: 17.1 mg/dL (ref 7.0–26.0)
CO2: 22 mEq/L (ref 22–29)
CREATININE: 0.7 mg/dL (ref 0.6–1.1)
Calcium: 10.7 mg/dL — ABNORMAL HIGH (ref 8.4–10.4)
Chloride: 103 mEq/L (ref 98–109)
EGFR: >90 mL/min/{1.73_m2} (ref >90)
Glucose: 199 mg/dl — ABNORMAL HIGH (ref 70–140)
Potassium: 4.4 mEq/L (ref 3.5–5.1)
Sodium: 138 mEq/L (ref 136–145)

## 2015-10-13 MED ORDER — HEPARIN SOD (PORK) LOCK FLUSH 100 UNIT/ML IV SOLN
500.0000 [IU] | Freq: Once | INTRAVENOUS | Status: AC | PRN
  Administered 2015-10-13: 500 [IU]
  Filled 2015-10-13: qty 5

## 2015-10-13 MED ORDER — FAMOTIDINE IN NACL 20-0.9 MG/50ML-% IV SOLN
20.0000 mg | Freq: Once | INTRAVENOUS | Status: AC
  Administered 2015-10-13: 20 mg via INTRAVENOUS

## 2015-10-13 MED ORDER — SODIUM CHLORIDE 0.9 % IV SOLN
Freq: Once | INTRAVENOUS | Status: AC
  Administered 2015-10-13: 09:00:00 via INTRAVENOUS
  Filled 2015-10-13: qty 8

## 2015-10-13 MED ORDER — DIPHENHYDRAMINE HCL 50 MG/ML IJ SOLN
INTRAMUSCULAR | Status: AC
  Filled 2015-10-13: qty 1

## 2015-10-13 MED ORDER — DIPHENHYDRAMINE HCL 50 MG/ML IJ SOLN
50.0000 mg | Freq: Once | INTRAMUSCULAR | Status: AC
  Administered 2015-10-13: 50 mg via INTRAVENOUS

## 2015-10-13 MED ORDER — SODIUM CHLORIDE 0.9 % IJ SOLN
10.0000 mL | INTRAMUSCULAR | Status: DC | PRN
  Administered 2015-10-13: 10 mL
  Filled 2015-10-13: qty 10

## 2015-10-13 MED ORDER — FAMOTIDINE IN NACL 20-0.9 MG/50ML-% IV SOLN
INTRAVENOUS | Status: AC
  Filled 2015-10-13: qty 50

## 2015-10-13 MED ORDER — SODIUM CHLORIDE 0.9 % IV SOLN
900.0000 mg | Freq: Once | INTRAVENOUS | Status: AC
  Administered 2015-10-13: 900 mg via INTRAVENOUS
  Filled 2015-10-13: qty 90

## 2015-10-13 MED ORDER — PACLITAXEL CHEMO INJECTION 300 MG/50ML
175.0000 mg/m2 | Freq: Once | INTRAVENOUS | Status: AC
  Administered 2015-10-13: 384 mg via INTRAVENOUS
  Filled 2015-10-13: qty 64

## 2015-10-13 MED ORDER — SODIUM CHLORIDE 0.9 % IV SOLN
Freq: Once | INTRAVENOUS | Status: AC
  Administered 2015-10-13: 10:00:00 via INTRAVENOUS
  Filled 2015-10-13: qty 5

## 2015-10-13 MED ORDER — SODIUM CHLORIDE 0.9 % IV SOLN
Freq: Once | INTRAVENOUS | Status: DC
  Filled 2015-10-13: qty 5

## 2015-10-13 MED ORDER — SODIUM CHLORIDE 0.9 % IV SOLN
Freq: Once | INTRAVENOUS | Status: AC
  Administered 2015-10-13: 09:00:00 via INTRAVENOUS

## 2015-10-13 MED ORDER — SODIUM CHLORIDE 0.9 % IJ SOLN
10.0000 mL | INTRAMUSCULAR | Status: DC | PRN
  Administered 2015-10-13: 10 mL via INTRAVENOUS
  Filled 2015-10-13: qty 10

## 2015-10-13 NOTE — Progress Notes (Signed)
Pt in for flush today. Port placement verification from [facility], located in care everywhere. Copy printed and placed in box to be scanned into media. Also documented in flowsheet for easy access.

## 2015-10-13 NOTE — Patient Instructions (Signed)
[facility] Discharge Instructions for Patients Receiving Chemotherapy  Today you received the following chemotherapy agents Taxol and Carboplatin  To help prevent nausea and vomiting after your treatment, we encourage you to take your nausea medication as directed.   If you develop nausea and vomiting that is not controlled by your nausea medication, call the clinic.   BELOW ARE SYMPTOMS THAT SHOULD BE REPORTED IMMEDIATELY:  *FEVER GREATER THAN 100.5 F  *CHILLS WITH OR WITHOUT FEVER  NAUSEA AND VOMITING THAT IS NOT CONTROLLED WITH YOUR NAUSEA MEDICATION  *UNUSUAL SHORTNESS OF BREATH  *UNUSUAL BRUISING OR BLEEDING  TENDERNESS IN MOUTH AND THROAT WITH OR WITHOUT PRESENCE OF ULCERS  *URINARY PROBLEMS  *BOWEL PROBLEMS  UNUSUAL RASH Items with * indicate a potential emergency and should be followed up as soon as possible.  Feel free to call the clinic you have any questions or concerns. The clinic phone number is [phone].  Please show the CHEMO ALERT CARD at check-in to the Emergency Department and triage nurse.      Paclitaxel injection What is this medicine? PACLITAXEL (PAK li TAX el) is a chemotherapy drug. It targets fast dividing cells, like cancer cells, and causes these cells to die. This medicine is used to treat ovarian cancer, breast cancer, and other cancers. This medicine may be used for other purposes; ask your health care provider or pharmacist if you have questions. What should I tell my health care provider before I take this medicine? They need to know if you have any of these conditions: -blood disorders -irregular heartbeat -infection (especially a virus infection such as chickenpox, cold sores, or herpes) -liver disease -previous or ongoing radiation therapy -an unusual or allergic reaction to paclitaxel, alcohol, polyoxyethylated castor oil, other chemotherapy agents, other medicines, foods, dyes, or preservatives -pregnant or  trying to get pregnant -breast-feeding How should I use this medicine? This drug is given as an infusion into a vein. It is administered in a hospital or clinic by a specially trained health care professional. Talk to your pediatrician regarding the use of this medicine in children. Special care may be needed. Overdosage: If you think you have taken too much of this medicine contact a poison control center or emergency room at once. NOTE: This medicine is only for you. Do not share this medicine with others. What if I miss a dose? It is important not to miss your dose. Call your doctor or health care professional if you are unable to keep an appointment. What may interact with this medicine? Do not take this medicine with any of the following medications: -disulfiram -metronidazole This medicine may also interact with the following medications: -cyclosporine -diazepam -ketoconazole -medicines to increase blood counts like filgrastim, pegfilgrastim, sargramostim -other chemotherapy drugs like cisplatin, doxorubicin, epirubicin, etoposide, teniposide, vincristine -quinidine -testosterone -vaccines -verapamil Talk to your doctor or health care professional before taking any of these medicines: -acetaminophen -aspirin -ibuprofen -ketoprofen -naproxen This list may not describe all possible interactions. Give your health care provider a list of all the medicines, herbs, non-prescription drugs, or dietary supplements you use. Also tell them if you smoke, drink alcohol, or use illegal drugs. Some items may interact with your medicine. What should I watch for while using this medicine? Your condition will be monitored carefully while you are receiving this medicine. You will need important blood work done while you are taking this medicine. This drug may make you feel generally unwell. This is not uncommon, as chemotherapy can affect   healthy cells as well as cancer cells. Report any side  effects. Continue your course of treatment even though you feel ill unless your doctor tells you to stop. This medicine can cause serious allergic reactions. To reduce your risk you will need to take other medicine(s) before treatment with this medicine. In some cases, you may be given additional medicines to help with side effects. Follow all directions for their use. Call your doctor or health care professional for advice if you get a fever, chills or sore throat, or other symptoms of a cold or flu. Do not treat yourself. This drug decreases your body's ability to fight infections. Try to avoid being around people who are sick. This medicine may increase your risk to bruise or bleed. Call your doctor or health care professional if you notice any unusual bleeding. Be careful brushing and flossing your teeth or using a toothpick because you may get an infection or bleed more easily. If you have any dental work done, tell your dentist you are receiving this medicine. Avoid taking products that contain aspirin, acetaminophen, ibuprofen, naproxen, or ketoprofen unless instructed by your doctor. These medicines may hide a fever. Do not become pregnant while taking this medicine. Women should inform their doctor if they wish to become pregnant or think they might be pregnant. There is a potential for serious side effects to an unborn child. Talk to your health care professional or pharmacist for more information. Do not breast-feed an infant while taking this medicine. Men are advised not to father a child while receiving this medicine. This product may contain alcohol. Ask your pharmacist or healthcare provider if this medicine contains alcohol. Be sure to tell all healthcare providers you are taking this medicine. Certain medicines, like metronidazole and disulfiram, can cause an unpleasant reaction when taken with alcohol. The reaction includes flushing, headache, nausea, vomiting, sweating, and increased  thirst. The reaction can last from 30 minutes to several hours. What side effects may I notice from receiving this medicine? Side effects that you should report to your doctor or health care professional as soon as possible: -allergic reactions like skin rash, itching or hives, swelling of the face, lips, or tongue -low blood counts - This drug may decrease the number of white blood cells, red blood cells and platelets. You may be at increased risk for infections and bleeding. -signs of infection - fever or chills, cough, sore throat, pain or difficulty passing urine -signs of decreased platelets or bleeding - bruising, pinpoint red spots on the skin, black, tarry stools, nosebleeds -signs of decreased red blood cells - unusually weak or tired, fainting spells, lightheadedness -breathing problems -chest pain -high or low blood pressure -mouth sores -nausea and vomiting -pain, swelling, redness or irritation at the injection site -pain, tingling, numbness in the hands or feet -slow or irregular heartbeat -swelling of the ankle, feet, hands Side effects that usually do not require medical attention (report to your doctor or health care professional if they continue or are bothersome): -bone pain -complete hair loss including hair on your head, underarms, pubic hair, eyebrows, and eyelashes -changes in the color of fingernails -diarrhea -loosening of the fingernails -loss of appetite -muscle or joint pain -red flush to skin -sweating This list may not describe all possible side effects. Call your doctor for medical advice about side effects. You may report side effects to FDA at 1-800-FDA-1088. Where should I keep my medicine? This drug is given in a hospital or clinic and will   not be stored at home. NOTE: This sheet is a summary. It may not cover all possible information. If you have questions about this medicine, talk to your doctor, pharmacist, or health care provider.    2016,  Elsevier/Gold Standard. (2015-08-04 13:02:56)     Carboplatin injection What is this medicine? CARBOPLATIN (KAR boe pla tin) is a chemotherapy drug. It targets fast dividing cells, like cancer cells, and causes these cells to die. This medicine is used to treat ovarian cancer and many other cancers. This medicine may be used for other purposes; ask your health care provider or pharmacist if you have questions. What should I tell my health care provider before I take this medicine? They need to know if you have any of these conditions: -blood disorders -hearing problems -kidney disease -recent or ongoing radiation therapy -an unusual or allergic reaction to carboplatin, cisplatin, other chemotherapy, other medicines, foods, dyes, or preservatives -pregnant or trying to get pregnant -breast-feeding How should I use this medicine? This drug is usually given as an infusion into a vein. It is administered in a hospital or clinic by a specially trained health care professional. Talk to your pediatrician regarding the use of this medicine in children. Special care may be needed. Overdosage: If you think you have taken too much of this medicine contact a poison control center or emergency room at once. NOTE: This medicine is only for you. Do not share this medicine with others. What if I miss a dose? It is important not to miss a dose. Call your doctor or health care professional if you are unable to keep an appointment. What may interact with this medicine? -medicines for seizures -medicines to increase blood counts like filgrastim, pegfilgrastim, sargramostim -some antibiotics like amikacin, gentamicin, neomycin, streptomycin, tobramycin -vaccines Talk to your doctor or health care professional before taking any of these medicines: -acetaminophen -aspirin -ibuprofen -ketoprofen -naproxen This list may not describe all possible interactions. Give your health care provider a list of all  the medicines, herbs, non-prescription drugs, or dietary supplements you use. Also tell them if you smoke, drink alcohol, or use illegal drugs. Some items may interact with your medicine. What should I watch for while using this medicine? Your condition will be monitored carefully while you are receiving this medicine. You will need important blood work done while you are taking this medicine. This drug may make you feel generally unwell. This is not uncommon, as chemotherapy can affect healthy cells as well as cancer cells. Report any side effects. Continue your course of treatment even though you feel ill unless your doctor tells you to stop. In some cases, you may be given additional medicines to help with side effects. Follow all directions for their use. Call your doctor or health care professional for advice if you get a fever, chills or sore throat, or other symptoms of a cold or flu. Do not treat yourself. This drug decreases your body's ability to fight infections. Try to avoid being around people who are sick. This medicine may increase your risk to bruise or bleed. Call your doctor or health care professional if you notice any unusual bleeding. Be careful brushing and flossing your teeth or using a toothpick because you may get an infection or bleed more easily. If you have any dental work done, tell your dentist you are receiving this medicine. Avoid taking products that contain aspirin, acetaminophen, ibuprofen, naproxen, or ketoprofen unless instructed by your doctor. These medicines may hide a fever. Do   not become pregnant while taking this medicine. Women should inform their doctor if they wish to become pregnant or think they might be pregnant. There is a potential for serious side effects to an unborn child. Talk to your health care professional or pharmacist for more information. Do not breast-feed an infant while taking this medicine. What side effects may I notice from receiving this  medicine? Side effects that you should report to your doctor or health care professional as soon as possible: -allergic reactions like skin rash, itching or hives, swelling of the face, lips, or tongue -signs of infection - fever or chills, cough, sore throat, pain or difficulty passing urine -signs of decreased platelets or bleeding - bruising, pinpoint red spots on the skin, black, tarry stools, nosebleeds -signs of decreased red blood cells - unusually weak or tired, fainting spells, lightheadedness -breathing problems -changes in hearing -changes in vision -chest pain -high blood pressure -low blood counts - This drug may decrease the number of white blood cells, red blood cells and platelets. You may be at increased risk for infections and bleeding. -nausea and vomiting -pain, swelling, redness or irritation at the injection site -pain, tingling, numbness in the hands or feet -problems with balance, talking, walking -trouble passing urine or change in the amount of urine Side effects that usually do not require medical attention (report to your doctor or health care professional if they continue or are bothersome): -hair loss -loss of appetite -metallic taste in the mouth or changes in taste This list may not describe all possible side effects. Call your doctor for medical advice about side effects. You may report side effects to FDA at 1-800-FDA-1088. Where should I keep my medicine? This drug is given in a hospital or clinic and will not be stored at home. NOTE: This sheet is a summary. It may not cover all possible information. If you have questions about this medicine, talk to your doctor, pharmacist, or health care provider.    2016, Elsevier/Gold Standard. (2008-03-23 14:38:05)   

## 2015-10-13 NOTE — Progress Notes (Signed)
0855: HR 101. Dr. Marko Plume aware. Pt states she has anxiety. Per Dr. Marko Plume okay to proceed with treatment, Okay to give pt Ativan IV or PO 0.5 to 1 mg Per Dr. Marko Plume. Pt aware and states she had taken ativan prior to coming to clinic today and does not feel as if she needs anymore at this time. Pt educated to inform staff if she feels the need for any ativan during treatment. Pt verbalizes understanding. Pt and VS stable at time of discharge

## 2015-10-13 NOTE — Patient Instructions (Signed)

## 2015-10-14 ENCOUNTER — Telehealth: Payer: Self-pay

## 2015-10-14 NOTE — Telephone Encounter (Signed)
-----   Message from Egbert Garibaldi, RN sent at 10/13/2015  2:49 PM EDT ----- Regarding: Dr. Marko Plume chemo F/U call  Pt of Dr. Marko Plume. First time Taxol and Carboplatin. Had one treatment at [facility]. Pt tolerated well.

## 2015-10-14 NOTE — Telephone Encounter (Signed)
Sheena Miles is doing well.  She is drinking fluids well.  Has had no problems with bowels. Experienced some nausea this am but forgot to take compazine last night.  No nausea now. She knows to call [phone] if she has any questions or concerns prior to her follow up appointment on 10-20-15 with Dr. Marko Plume.

## 2015-10-19 ENCOUNTER — Telehealth: Payer: Self-pay | Admitting: *Deleted

## 2015-10-19 NOTE — Telephone Encounter (Signed)
LM for Sheena Miles stating that she can discuss the appointment adjustment from 11-3 to 11-7 -16 with Dr. Marko Plume at her appointment tomorrow.

## 2015-10-19 NOTE — Telephone Encounter (Signed)
Patient called stating that she would like to reschedule her next infusion due 11/03/15. No pending infusions scheduled. Left voicemail for patient to return [place] call to make this appointment.

## 2015-10-20 ENCOUNTER — Ambulatory Visit (HOSPITAL_BASED_OUTPATIENT_CLINIC_OR_DEPARTMENT_OTHER): Payer: BC Managed Care – PPO | Admitting: Oncology

## 2015-10-20 ENCOUNTER — Telehealth: Payer: Self-pay | Admitting: Oncology

## 2015-10-20 ENCOUNTER — Encounter: Payer: Self-pay | Admitting: Oncology

## 2015-10-20 ENCOUNTER — Other Ambulatory Visit: Payer: Self-pay | Admitting: Oncology

## 2015-10-20 ENCOUNTER — Other Ambulatory Visit (HOSPITAL_BASED_OUTPATIENT_CLINIC_OR_DEPARTMENT_OTHER): Payer: BC Managed Care – PPO

## 2015-10-20 ENCOUNTER — Ambulatory Visit: Payer: BC Managed Care – PPO | Admitting: Nutrition

## 2015-10-20 VITALS — BP 116/70 | HR 97 | Temp 97.6°F | Resp 18 | Ht 65.0 in | Wt 236.5 lb

## 2015-10-20 DIAGNOSIS — C561 Malignant neoplasm of right ovary: Secondary | ICD-10-CM

## 2015-10-20 DIAGNOSIS — D72819 Decreased white blood cell count, unspecified: Secondary | ICD-10-CM | POA: Diagnosis not present

## 2015-10-20 DIAGNOSIS — C569 Malignant neoplasm of unspecified ovary: Secondary | ICD-10-CM

## 2015-10-20 DIAGNOSIS — R112 Nausea with vomiting, unspecified: Secondary | ICD-10-CM

## 2015-10-20 DIAGNOSIS — Z5189 Encounter for other specified aftercare: Secondary | ICD-10-CM

## 2015-10-20 DIAGNOSIS — Z95828 Presence of other vascular implants and grafts: Secondary | ICD-10-CM

## 2015-10-20 DIAGNOSIS — C562 Malignant neoplasm of left ovary: Secondary | ICD-10-CM

## 2015-10-20 DIAGNOSIS — T8131XD Disruption of external operation (surgical) wound, not elsewhere classified, subsequent encounter: Secondary | ICD-10-CM

## 2015-10-20 DIAGNOSIS — D701 Agranulocytosis secondary to cancer chemotherapy: Secondary | ICD-10-CM

## 2015-10-20 DIAGNOSIS — T451X5A Adverse effect of antineoplastic and immunosuppressive drugs, initial encounter: Secondary | ICD-10-CM

## 2015-10-20 DIAGNOSIS — E8940 Asymptomatic postprocedural ovarian failure: Secondary | ICD-10-CM

## 2015-10-20 DIAGNOSIS — D6481 Anemia due to antineoplastic chemotherapy: Secondary | ICD-10-CM

## 2015-10-20 LAB — CBC WITH DIFFERENTIAL/PLATELET
BASO%: 1.4 % (ref 0.0–2.0)
Basophils Absolute: 0.1 10*3/uL (ref 0.0–0.1)
EOS ABS: 0.1 10*3/uL (ref 0.0–0.5)
EOS%: 2.1 % (ref 0.0–7.0)
HCT: 31.9 % — ABNORMAL LOW (ref 34.8–46.6)
HEMOGLOBIN: 10.3 g/dL — AB (ref 11.6–15.9)
LYMPH#: 1.7 10*3/uL (ref 0.9–3.3)
LYMPH%: 45.9 % (ref 14.0–49.7)
MCH: 26.2 pg (ref 25.1–34.0)
MCHC: 32.3 g/dL (ref 31.5–36.0)
MCV: 81.1 fL (ref 79.5–101.0)
MONO#: 0.2 10*3/uL (ref 0.1–0.9)
MONO%: 5 % (ref 0.0–14.0)
NEUT%: 45.6 % (ref 38.4–76.8)
NEUTROS ABS: 1.7 10*3/uL (ref 1.5–6.5)
Platelets: 282 10*3/uL (ref 145–400)
RBC: 3.94 10*6/uL (ref 3.70–5.45)
RDW: 18.9 % — AB (ref 11.2–14.5)
WBC: 3.7 10*3/uL — AB (ref 3.9–10.3)

## 2015-10-20 LAB — COMPREHENSIVE METABOLIC PANEL (CC13)
ALBUMIN: 3.6 g/dL (ref 3.5–5.0)
ALK PHOS: 91 U/L (ref 40–150)
ALT: 36 U/L (ref 0–55)
AST: 20 U/L (ref 5–34)
Anion Gap: 8 mEq/L (ref 3–11)
BUN: 12.2 mg/dL (ref 7.0–26.0)
CO2: 24 mEq/L (ref 22–29)
CREATININE: 0.6 mg/dL (ref 0.6–1.1)
Calcium: 9.8 mg/dL (ref 8.4–10.4)
Chloride: 110 mEq/L — ABNORMAL HIGH (ref 98–109)
EGFR: >90 mL/min/{1.73_m2} (ref >90)
GLUCOSE: 94 mg/dL (ref 70–140)
Potassium: 3.7 mEq/L (ref 3.5–5.1)
SODIUM: 142 meq/L (ref 136–145)
TOTAL PROTEIN: 7.1 g/dL (ref 6.4–8.3)

## 2015-10-20 MED ORDER — TBO-FILGRASTIM 480 MCG/0.8ML ~~LOC~~ SOSY
480.0000 ug | PREFILLED_SYRINGE | Freq: Once | SUBCUTANEOUS | Status: AC
  Administered 2015-10-20: 480 ug via SUBCUTANEOUS
  Filled 2015-10-20: qty 0.8

## 2015-10-20 NOTE — Progress Notes (Signed)
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 She will have granix today and 10-21-15.   Patient had less severe taxol aches with cycle 2, tho these were still very apparent for 2 days, better with pain medication and heating pad, also again used claritin. She had more nausea, with dry heaves, some improvement with compazine. She did not have aloxi with cycle 2, but did not use zofran in addition to compazine; will add aloxi back to subsequent cycles (so will not use zofran until day 4). Bowels are moving daily. The surgical wound is progressively improving, very little packing needed now. Smells are still difficult, tho appetite better and po fluids good now. She has no peripheral neuropathy.  Power PAC placed at [facility] 09-09-15 Flu vaccine given today Genetics counseling scheduled for 10-27-15.  Patient wants to return to work on limited basis beginning 10-24-15. She will be out of work on weeks of chemo and can leave early afternoons. 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 We have discussed this at length, and she understands that she may have to reconsider this if too much with present chemotherapy. She is to be in a wedding on 11-5, requests next chemo be moved to 11-07-15.   ONCOLOGIC HISTORY Patient initially developed constipation and abdominal pain summer 2016 which she thought was stress related, then worsening abdominal and pelvic pain, some back pain and some fever; she had weight gain ~ 30 lbs in prior 5-6 months. She was seen at [facility] with concern for acute appendicitis. She was evaluated at local ED, I believe [facility], with CT reportedly showing  large left and small right pleural effusions, moderate ascites, complex septated cystic mass in low pelvis into left abdomen 13 x 7 x 7 cm, adenopathy Including large diaphragmatic lymph nodes anterior to heart and numerous mesenteric nodes, and apparent carcinomatosis with implants in omentum up to 3.8 cm. She was seen by Dr [name] and referred to Dr Alycia Rossetti. At Dr Elenora Gamma exam 08-31-15 there was large mass in cul de sac and abdominal fluid wave, decreased BS left lower 1/2 of chest. Lab studies included CA 125 33,145; inhibin B <10; CEA <0.5, AFP 2.2, LDH 1281 and HCG negative. Surgery by Dr Alycia Rossetti at [address] [facility] on 09-06-15 was exploratory laparotomy with supracervical hysterectomy, BSO, omentectomy, resection with primary reanastomosis of rectosigmoid and peritoneal stripping. Intraoperative findings included 3 liters of bloody ascites, 12 cm omental mass adherent to uterus, sigmoid colon and rectum which was resected en bloc, bladder nodule resected. At completion of surgery there was residual tumor along entirety of small bowel mesentery with multiple nodules up to 1.5 cm, miliary disease on diaphragm, and peritoneal nodularity along pelvis and rectum (R2 resection). 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 No nodes submitted.  [facility] discharge summary is not presently available, however she was transfused 2 units PRBCs on each 9-9 and 09-13-15 for hemoglobin as low as 6.8 - 7.1. She had urgent right thoracentesis for 750 cc on 09-08-15 with cytology positive for high grade serous carcinoma ([id number]) and left thoracentesis for 400 cc on 09-09-15.  CT angio chest on POD 1 was negative for PE. She tolerated morphine by PCA post operatively. She had mils cellulitis at abdominal incision on day of DC, begun on Keflex then. Psychiatry saw in hospital, begun on zoloft. She was  discharged home with 28 days of lovenox. She saw Dr Alycia Rossetti on 09-19-15 with staples removed, however wound opened that evening. She was seen by gyn onc provider on 09-23-15, with wound open 3 x 5 cm, 3 cm deep, no evidence of infection. Wet to dry dressings bid were begun, ongoing. She had follow up wound check on 09-27-15, no findings of concern. GOG protocol at [facility] had delay, so that treatment given off protocol with first cycle of carboplatin taxol at [facility] on 09-26-15, with carboplatin 900 mg total dose, taxol at 175 mg/m2 = 399 mg, decadron 12 mg, Aloxi 250 mg. EMEND 150 mg, benadryl 25 mg and pepcid 40 mg. She did not take oral decadron premedication for the taxol. She had severe, generalized aching beginning night of day 3 thru day 4    Review of systems as above, also: No fever or symptoms of infection. Hot flashes a little less severe. No pain at incision. No bleeding. No LE swelling. Bladder ok. More easily mobile. Not more SOB, now able to go up stairs at home. Remainder of 10 point Review of Systems negative.  Objective:  Vital signs in last 24 hours:  BP 116/70 mmHg  Pulse 97  Temp(Src) 97.6 F (36.4 C) (Oral)  Resp 18  Ht 5' 5"  (1.651 m)  Wt 236 lb 8 oz (107.276 kg)  BMI 39.36 kg/m2  SpO2 100% Weight down 3 lbs Alert, oriented and appropriate. Ambulatory without assistance.  Alopecia  HEENT:PERRL, sclerae not icteric. Oral mucosa moist without lesions, posterior pharynx clear.  Neck supple. No JVD.  Lymphatics:no cervical,supraclavicular adenopathy Resp: clear to auscultation bilaterally and normal percussion bilaterally Cardio: regular rate and rhythm. No gallop. GI: soft, nontender, not distended. Some bowel sounds. Surgical incision with good pink granulation tissue, now ~ 1 cm deep, no surrounding erythema or tenderness Musculoskeletal/ Extremities: without pitting edema, cords, tenderness Neuro: no peripheral neuropathy. Otherwise nonfocal. PSYCH appropriate mood and  affect Skin without rash, ecchymosis, petechiae Portacath-without erythema or tenderness  Lab Results:  Results for orders placed or performed in visit on 10/20/15  CBC with Differential  Result Value Ref Range   WBC 3.7 (L) 3.9 - 10.3 10e3/uL   NEUT# 1.7 1.5 - 6.5 10e3/uL   HGB 10.3 (L) 11.6 - 15.9 g/dL   HCT 31.9 (L) 34.8 - 46.6 %   Platelets 282 145 - 400 10e3/uL   MCV 81.1 79.5 - 101.0 fL   MCH 26.2 25.1 - 34.0 pg   MCHC 32.3 31.5 - 36.0 g/dL   RBC 3.94 3.70 - 5.45 10e6/uL   RDW 18.9 (H) 11.2 - 14.5 %   lymph# 1.7 0.9 - 3.3 10e3/uL   MONO# 0.2 0.1 - 0.9 10e3/uL   Eosinophils Absolute 0.1 0.0 - 0.5 10e3/uL   Basophils Absolute 0.1 0.0 - 0.1 10e3/uL   NEUT% 45.6 38.4 - 76.8 %   LYMPH% 45.9 14.0 - 49.7 %   MONO% 5.0 0.0 - 14.0 %   EOS% 2.1 0.0 - 7.0 %   BASO% 1.4 0.0 - 2.0 %  Comprehensive metabolic panel (Cmet) - [facility]  Result Value Ref Range   Sodium 142 136 - 145 mEq/L   Potassium 3.7 3.5 - 5.1 mEq/L   Chloride 110 (H) 98 -  109 mEq/L   CO2 24 22 - 29 mEq/L   Glucose 94 70 - 140 mg/dl   BUN 12.2 7.0 - 26.0 mg/dL   Creatinine 0.6 0.6 - 1.1 mg/dL   Total Bilirubin <0.30 0.20 - 1.20 mg/dL   Alkaline Phosphatase 91 40 - 150 U/L   AST 20 5 - 34 U/L   ALT 36 0 - 55 U/L   Total Protein 7.1 6.4 - 8.3 g/dL   Albumin 3.6 3.5 - 5.0 g/dL   Calcium 9.8 8.4 - 10.4 mg/dL   Anion Gap 8 3 - 11 mEq/L   EGFR >90 >90 ml/min/1.73 m2     Studies/Results:  No results found.  Medications: I have reviewed the patient's current medications. Will add aloxi to chemo premeds starting next cycle, due to increased nausea cycle 2. 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 Antiemetics discussed. Work considerations discussed at length. Scheduling adjusted as she requests.   Assessment/Plan:  I.IVA high grade serous carcinoma of bilateral ovaries with extensive disease in  abdomen and pelvis such that optimal debulking could not be accomplished at surgery [facility] 09-06-15. Malignant pleural fluid and ascites at presentation, not clinically reaccumulating. I will see her with lab 10-27-15, with genetics counseling also that day. Cycle 3 will be 11-07-15, date per her request. Granix as above.  2.wound dehiscence: improving, mother continues to pack bid. [place] dietician to see also for any further suggestions. 3.surgical menopause: encouraged her to increase po fluids 4.flu vaccine 10-10-15 5.power PAC in 6.chemo nausea: add aloxi to [place] with subsequent treatments. 7.recent social stress with divorce   All questions answered. Letter written for her work. Granix orders entered. Aloxi added to chemo and managed care notified. Patient and mother know to call if concerns prior to next scheduled visit. Time spent  25 min including >50% counseling and coordination of care.  Cc Drs Alycia Rossetti, [facility]  Gordy Levan, MD   10/20/2015, 5:14 PM

## 2015-10-20 NOTE — Telephone Encounter (Signed)
Gave patient avs report and appointments for October and November.  °

## 2015-10-20 NOTE — Progress Notes (Signed)
29 year old female diagnosed with ovarian cancer.  She is a patient of Dr. Marko Plume.  Past medical history includes allergies.  Medications include Decadron, Lasix, Ativan, multivitamin, Prilosec, Zofran, probiotic, and Compazine.  Labs include albumin 3.5 on October 10.  Height: 65 inches. Weight: 236 pounds Usual body weight: 230 pounds per patient. BMI: 39.36.  Patient reports she does have nausea times, especially after chemotherapy. She reports some smells create nausea. States her wound is healing. Describes intolerance to red meat Is taking a multivitamin to help with healing.  Nutrition diagnosis: Increased nutrient needs related to ovarian cancer as evidenced by recent wound dehiscence.  Intervention:  Educated patient to consume adequate calories and increased protein to promote healing. Encouraged weight maintenance of lean body mass throughout treatment. Recommended patient continue multivitamin for improved healing. Provided samples of protein powder.  Recommended patient continue premier protein twice a day. Fact sheets were provided.  Questions were answered.  Teach back method was used.  Monitoring, evaluation, goals: Patient will tolerate adequate calories and protein to promote continued healing.  Next visit: To be scheduled as needed.  Patient has my contact information.  **Disclaimer: This note was dictated with voice recognition software. Similar sounding words can inadvertently be transcribed and this note may contain transcription errors which may not have been corrected upon publication of note.**

## 2015-10-21 ENCOUNTER — Ambulatory Visit (HOSPITAL_BASED_OUTPATIENT_CLINIC_OR_DEPARTMENT_OTHER): Payer: BC Managed Care – PPO

## 2015-10-21 ENCOUNTER — Encounter: Payer: Self-pay | Admitting: Oncology

## 2015-10-21 VITALS — BP 137/74 | HR 100 | Temp 98.2°F | Resp 20

## 2015-10-21 DIAGNOSIS — C561 Malignant neoplasm of right ovary: Secondary | ICD-10-CM | POA: Diagnosis not present

## 2015-10-21 DIAGNOSIS — C569 Malignant neoplasm of unspecified ovary: Secondary | ICD-10-CM

## 2015-10-21 DIAGNOSIS — Z5189 Encounter for other specified aftercare: Secondary | ICD-10-CM | POA: Diagnosis not present

## 2015-10-21 DIAGNOSIS — C562 Malignant neoplasm of left ovary: Secondary | ICD-10-CM

## 2015-10-21 MED ORDER — TBO-FILGRASTIM 480 MCG/0.8ML ~~LOC~~ SOSY
480.0000 ug | PREFILLED_SYRINGE | Freq: Once | SUBCUTANEOUS | Status: AC
  Administered 2015-10-21: 480 ug via SUBCUTANEOUS
  Filled 2015-10-21: qty 0.8

## 2015-10-21 MED ORDER — FERROUS FUMARATE 325 (106 FE) MG PO TABS
ORAL_TABLET | ORAL | Status: AC
Start: 2015-10-21 — End: ?

## 2015-10-21 NOTE — Patient Instructions (Signed)

## 2015-10-21 NOTE — Progress Notes (Signed)
Medical Oncology  Aloxi approved per [facility] managed care  L.Marko Plume MD

## 2015-10-26 ENCOUNTER — Other Ambulatory Visit: Payer: Self-pay | Admitting: Oncology

## 2015-10-26 DIAGNOSIS — C569 Malignant neoplasm of unspecified ovary: Secondary | ICD-10-CM

## 2015-10-27 ENCOUNTER — Telehealth: Payer: Self-pay

## 2015-10-27 ENCOUNTER — Ambulatory Visit (HOSPITAL_BASED_OUTPATIENT_CLINIC_OR_DEPARTMENT_OTHER): Payer: BC Managed Care – PPO

## 2015-10-27 ENCOUNTER — Encounter: Payer: Self-pay | Admitting: Genetic Counselor

## 2015-10-27 ENCOUNTER — Telehealth: Payer: Self-pay | Admitting: Oncology

## 2015-10-27 ENCOUNTER — Ambulatory Visit: Payer: BC Managed Care – PPO | Admitting: Genetic Counselor

## 2015-10-27 ENCOUNTER — Other Ambulatory Visit (HOSPITAL_BASED_OUTPATIENT_CLINIC_OR_DEPARTMENT_OTHER): Payer: BC Managed Care – PPO

## 2015-10-27 ENCOUNTER — Ambulatory Visit (HOSPITAL_BASED_OUTPATIENT_CLINIC_OR_DEPARTMENT_OTHER): Payer: BC Managed Care – PPO | Admitting: Oncology

## 2015-10-27 ENCOUNTER — Encounter: Payer: Self-pay | Admitting: Oncology

## 2015-10-27 VITALS — BP 122/72 | HR 114 | Temp 98.0°F | Resp 18 | Ht 65.0 in | Wt 238.7 lb

## 2015-10-27 DIAGNOSIS — E8940 Asymptomatic postprocedural ovarian failure: Secondary | ICD-10-CM

## 2015-10-27 DIAGNOSIS — D6481 Anemia due to antineoplastic chemotherapy: Secondary | ICD-10-CM

## 2015-10-27 DIAGNOSIS — Z452 Encounter for adjustment and management of vascular access device: Secondary | ICD-10-CM

## 2015-10-27 DIAGNOSIS — C569 Malignant neoplasm of unspecified ovary: Secondary | ICD-10-CM

## 2015-10-27 DIAGNOSIS — C561 Malignant neoplasm of right ovary: Secondary | ICD-10-CM

## 2015-10-27 DIAGNOSIS — T8131XD Disruption of external operation (surgical) wound, not elsewhere classified, subsequent encounter: Secondary | ICD-10-CM

## 2015-10-27 DIAGNOSIS — D72818 Other decreased white blood cell count: Secondary | ICD-10-CM

## 2015-10-27 DIAGNOSIS — T451X5A Adverse effect of antineoplastic and immunosuppressive drugs, initial encounter: Secondary | ICD-10-CM

## 2015-10-27 DIAGNOSIS — D701 Agranulocytosis secondary to cancer chemotherapy: Secondary | ICD-10-CM

## 2015-10-27 DIAGNOSIS — C562 Malignant neoplasm of left ovary: Secondary | ICD-10-CM

## 2015-10-27 DIAGNOSIS — Z95828 Presence of other vascular implants and grafts: Secondary | ICD-10-CM

## 2015-10-27 DIAGNOSIS — Z803 Family history of malignant neoplasm of breast: Secondary | ICD-10-CM | POA: Insufficient documentation

## 2015-10-27 DIAGNOSIS — Z808 Family history of malignant neoplasm of other organs or systems: Secondary | ICD-10-CM

## 2015-10-27 LAB — CBC WITH DIFFERENTIAL/PLATELET
BASO%: 0.1 % (ref 0.0–2.0)
Basophils Absolute: 0 10*3/uL (ref 0.0–0.1)
EOS ABS: 0.1 10*3/uL (ref 0.0–0.5)
EOS%: 0.7 % (ref 0.0–7.0)
HCT: 33.4 % — ABNORMAL LOW (ref 34.8–46.6)
HEMOGLOBIN: 10.8 g/dL — AB (ref 11.6–15.9)
LYMPH%: 30.5 % (ref 14.0–49.7)
MCH: 27.1 pg (ref 25.1–34.0)
MCHC: 32.3 g/dL (ref 31.5–36.0)
MCV: 83.9 fL (ref 79.5–101.0)
MONO#: 0.5 10*3/uL (ref 0.1–0.9)
MONO%: 7.3 % (ref 0.0–14.0)
NEUT%: 61.4 % (ref 38.4–76.8)
NEUTROS ABS: 4.5 10*3/uL (ref 1.5–6.5)
Platelets: 253 10*3/uL (ref 145–400)
RBC: 3.98 10*6/uL (ref 3.70–5.45)
RDW: 18.8 % — AB (ref 11.2–14.5)
WBC: 7.3 10*3/uL (ref 3.9–10.3)
lymph#: 2.2 10*3/uL (ref 0.9–3.3)

## 2015-10-27 LAB — COMPREHENSIVE METABOLIC PANEL (CC13)
ALT: 26 U/L (ref 0–55)
ANION GAP: 10 meq/L (ref 3–11)
AST: 20 U/L (ref 5–34)
Albumin: 3.9 g/dL (ref 3.5–5.0)
Alkaline Phosphatase: 99 U/L (ref 40–150)
BUN: 16.5 mg/dL (ref 7.0–26.0)
CHLORIDE: 106 meq/L (ref 98–109)
CO2: 24 meq/L (ref 22–29)
Calcium: 10.4 mg/dL (ref 8.4–10.4)
Creatinine: 0.6 mg/dL (ref 0.6–1.1)
Glucose: 86 mg/dl (ref 70–140)
Potassium: 4.1 mEq/L (ref 3.5–5.1)
Sodium: 140 mEq/L (ref 136–145)
TOTAL PROTEIN: 7.6 g/dL (ref 6.4–8.3)

## 2015-10-27 MED ORDER — HYDROCORTISONE 2.5 % RE CREA: 1.0000 "application " | TOPICAL_CREAM | Freq: Two times a day (BID) | RECTAL | Status: AC | PRN

## 2015-10-27 MED ORDER — HEPARIN SOD (PORK) LOCK FLUSH 100 UNIT/ML IV SOLN
500.0000 [IU] | Freq: Once | INTRAVENOUS | Status: AC
  Administered 2015-10-27: 500 [IU] via INTRAVENOUS
  Filled 2015-10-27: qty 5

## 2015-10-27 MED ORDER — SODIUM CHLORIDE 0.9 % IJ SOLN
10.0000 mL | INTRAMUSCULAR | Status: DC | PRN
  Administered 2015-10-27: 10 mL via INTRAVENOUS
  Filled 2015-10-27: qty 10

## 2015-10-27 MED ORDER — DEXAMETHASONE 4 MG PO TABS: ORAL_TABLET | ORAL | Status: DC

## 2015-10-27 NOTE — Telephone Encounter (Signed)
-----   Message from Gordy Levan, MD sent at 10/27/2015  3:00 PM EDT ----- Anusol HC to hemorrhoids bid prn  Generic fine, QS ~ 2 weeks   Decadron 4 mg  Five tabs with food 12 hrs and 6 hrs prior to chemo. #20 for 2 treatments  1 RF

## 2015-10-27 NOTE — Progress Notes (Signed)
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 She had gCSF 10-20 and 10-21 as counts were dropping and surgical wound is still being packed.  She saw genetics counselors today.  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  She has had less nausea this week, tho some odors still trigger this abruptly. Aloxi has been preauthorized per [facility] financial staff, will be used with subsequent cycles. Compazine and ativan helpful, also can use zofran after first days post Aloxi. Bowels are moving tho this is painful with hemorrhoid, using OTC and cannot yet do sitz baths but will use hand held shower to wash area regularly. She is eating and drinking liquids. She has had no fever. Hot flashes ongoing. No peripheral neuropathy. No problems with PAC   Power PAC placed at [facility] 09-09-15 Flu vaccine given today Genetics counseling 10-27-15.  ONCOLOGIC HISTORY Patient initially developed constipation and abdominal pain summer 2016 which she thought was stress related, then worsening abdominal and pelvic pain, some back pain and some fever; she had weight gain ~ 30 lbs in prior 5-6 months. She was seen at [facility] with concern for acute appendicitis. She was evaluated at local ED, I believe [facility], with CT reportedly showing large left and small right pleural effusions, moderate ascites, complex septated cystic mass in low pelvis into left abdomen 13 x 7 x 7 cm, adenopathy Including large diaphragmatic lymph nodes  anterior to heart and numerous mesenteric nodes, and apparent carcinomatosis with implants in omentum up to 3.8 cm. She was seen by Dr [name] and referred to Dr Alycia Rossetti. At Dr Elenora Gamma exam 08-31-15 there was large mass in cul de sac and abdominal fluid wave, decreased BS left lower 1/2 of chest. Lab studies included CA 125 33,145; inhibin B <10; CEA <0.5, AFP 2.2, LDH 1281 and HCG negative. Surgery by Dr Alycia Rossetti at [facility] on 09-06-15 was exploratory laparotomy with supracervical hysterectomy, BSO, omentectomy, resection with primary reanastomosis of rectosigmoid and peritoneal stripping. Intraoperative findings included 3 liters of bloody ascites, 12 cm omental mass adherent to uterus, sigmoid colon and rectum which was resected en bloc, bladder nodule resected. At completion of surgery there was residual tumor along entirety of small bowel mesentery with multiple nodules up to 1.5 cm, miliary disease on diaphragm, and peritoneal nodularity along pelvis and rectum (R2 resection). 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 No nodes submitted.  [facility] discharge summary is not presently available, however she was transfused 2 units PRBCs on each 9-9 and 09-13-15 for hemoglobin as low as 6.8 - 7.1. She had urgent right thoracentesis for 750 cc on 09-08-15 with cytology positive for high grade serous carcinoma ([id number]) and left thoracentesis for 400 cc on 09-09-15. CT angio chest on POD 1 was negative for PE. She tolerated morphine by PCA post operatively. She had mild cellulitis at abdominal incision on day of DC, begun on Keflex  then. Psychiatry saw in hospital, begun on zoloft. She was discharged home with 28 days of lovenox. She saw Dr Alycia Rossetti on 09-19-15 with staples removed, however wound opened that evening. She was seen by gyn onc provider on 09-23-15, with wound open 3 x 5 cm, 3  cm deep, no evidence of infection. Wet to dry dressings bid were begun, ongoing. She had follow up wound check on 09-27-15, no findings of concern. GOG protocol at [facility] had delay, so that treatment given off protocol with first cycle of carboplatin taxol at [facility] on 09-26-15, with carboplatin 900 mg total dose, taxol at 175 mg/m2 = 399 mg, decadron 12 mg, Aloxi 250 mg. EMEND 150 mg, benadryl 25 mg and pepcid 40 mg. She did not take oral decadron premedication for the taxol. She had severe, generalized aching beginning night of day 3 thru day 4    Review of systems as above, also: Some SOB with exertion, denies at rest. Abdominal wound continues to heal, needs less packing. Bladder ok. Sleeping better since has been back at school. Remainder of 10 point Review of Systems negative.  Objective:  Vital signs in last 24 hours:  BP 122/72 mmHg  Pulse 114  Temp(Src) 98 F (36.7 C) (Oral)  Resp 18  Ht 5' 5" (1.651 m)  Wt 238 lb 11.2 oz (108.274 kg)  BMI 39.72 kg/m2  SpO2 100% Weight up 2 lbs Alert, oriented and appropriate. Ambulatory without assistance. Activity appears with some effort, slightly diaphoretic, respirations somewhat labored with exertion Alopecia / hair shaved.  HEENT:PERRL, sclerae not icteric. Oral mucosa moist without lesions, posterior pharynx clear.  Neck supple. No JVD.  Lymphatics:no cervical,supraclavicular adenopathy Resp: clear to auscultation bilaterally and normal percussion bilaterally Cardio: regular rate and rhythm. No gallop. GI: soft, nontender, not distended.. A few bowel sounds. Surgical incision dressed, not tender. Musculoskeletal/ Extremities: without pitting edema, cords, tenderness Neuro: no peripheral neuropathy. Otherwise nonfocal. PSYCH cheerful and pleasant Skin without rash, ecchymosis, petechiae Portacath-without erythema or tenderness  Lab Results:  Results for orders placed or performed in visit on 10/27/15  CBC with Differential  Result  Value Ref Range   WBC 7.3 3.9 - 10.3 10e3/uL   NEUT# 4.5 1.5 - 6.5 10e3/uL   HGB 10.8 (L) 11.6 - 15.9 g/dL   HCT 33.4 (L) 34.8 - 46.6 %   Platelets 253 145 - 400 10e3/uL   MCV 83.9 79.5 - 101.0 fL   MCH 27.1 25.1 - 34.0 pg   MCHC 32.3 31.5 - 36.0 g/dL   RBC 3.98 3.70 - 5.45 10e6/uL   RDW 18.8 (H) 11.2 - 14.5 %   lymph# 2.2 0.9 - 3.3 10e3/uL   MONO# 0.5 0.1 - 0.9 10e3/uL   Eosinophils Absolute 0.1 0.0 - 0.5 10e3/uL   Basophils Absolute 0.0 0.0 - 0.1 10e3/uL   NEUT% 61.4 38.4 - 76.8 %   LYMPH% 30.5 14.0 - 49.7 %   MONO% 7.3 0.0 - 14.0 %   EOS% 0.7 0.0 - 7.0 %   BASO% 0.1 0.0 - 2.0 %  Comprehensive metabolic panel (Cmet) - [facility]  Result Value Ref Range   Sodium 140 136 - 145 mEq/L   Potassium 4.1 3.5 - 5.1 mEq/L   Chloride 106 98 - 109 mEq/L   CO2 24 22 - 29 mEq/L   Glucose 86 70 - 140 mg/dl   BUN 16.5 7.0 - 26.0 mg/dL   Creatinine 0.6 0.6 - 1.1 mg/dL   Total Bilirubin <0.30 0.20 - 1.20  mg/dL   Alkaline Phosphatase 99 40 - 150 U/L   AST 20 5 - 34 U/L   ALT 26 0 - 55 U/L   Total Protein 7.6 6.4 - 8.3 g/dL   Albumin 3.9 3.5 - 5.0 g/dL   Calcium 10.4 8.4 - 10.4 mg/dL   Anion Gap 10 3 - 11 mEq/L   EGFR >90 >90 ml/min/1.73 m2   CA 125 was 8521 on 10-10-15, will be repeated with lab on 11-07-15.  Studies/Results:  No results found.  Medications: I have reviewed the patient's current medications. Refill decadron for 2 cycles. Anusol HC, continue colace  DISCUSSION: CBC reviewed at time of visit. Interventions for hemorrhoids. Recommended granix x2 week after cycle 3, one of which can coordinate with my visit 11-17.   She prefers appointments after 2 pm as possible due to school schedule. Again have encouraged her to limit work as much as possible for now. Cycle 3 will be given on 11-07-15 (delayed from 11-4 as she is in wedding on 11-5) as long as [place] >=1.5 and plt >=100k.   Assessment/Plan: I.IVA high grade serous carcinoma of bilateral ovaries with extensive disease in abdomen  and pelvis, suboptimal debulking at surgery [facility] 09-06-15. Malignant pleural fluid and ascites at presentation. Cycle 3 will be 11-07-15 with addition of aloxi. Granix beginning week after treatment, timing due to taxol aches. I will see her 11-17-15 with labs.   Genetics counseling today 2.wound dehiscence: improving, mother continues to pack bid. 3.surgical menopause: encouraged her to increase po fluids 4.flu vaccine 10-10-15 5.power PAC in 6.chemo nausea: add aloxi to [place] with subsequent treatments. 7.recent social stress with divorce. She very much wanted to return to work, resumed part time this week 8.hemorrhoid discomfort: try anusol HC and hand held shower. Note rectosigmoid resection with reanastomosis at debulking surgery. 9.mild chemo anemia, iron studies ok 09-30-15. Follow counts. 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 Chemo orders confirmed. Cc Drs Alycia Rossetti and [facility]. TIme spent 25 min including >50% counseling and coordination of care.  Jerrod Damiano P, MD   10/27/2015, 4:19 PM

## 2015-10-27 NOTE — Progress Notes (Signed)
REFERRING PROVIDER: Evlyn Clines, MD  PRIMARY PROVIDER:  Rochel Brome, MD  PRIMARY REASON FOR VISIT:  1. Epithelial ovarian cancer, FIGO stage IVA ([place])   2. Family history of breast cancer   3. Family history of skin cancer      HISTORY OF PRESENT ILLNESS:   Sheena Miles, a 29 y.o. female, was seen for a [place] cancer genetics consultation at the request of Dr. Marko Plume due to a personal history of ovarian cancer at 19 and family history of breast cancer cancer.  Sheena Miles presents to clinic today with a friend to discuss the possibility of a hereditary predisposition to cancer, genetic testing, and to further clarify her future cancer risks, as well as potential cancer risks for family members.   In 2016, at the age of 71, Sheena Miles was diagnosed with serous carcinoma of the ovary. This was treated with TAH-BSO, and chemotherapy.    CANCER HISTORY:    Ovarian ca ([place])   08/31/2015 Initial Diagnosis Ovarian ca [facility])   09/16/2015 Surgery    09/26/2015 -  Chemotherapy paclitaxel and carboplatin     HORMONAL RISK FACTORS:  Menarche was at age 10.  First live birth - no children.  OCP use for approximately 8 years.  Ovaries intact: no. (surgery on September 06, 2015 at [facility]) Hysterectomy: yes.  Menopausal status: surgical menopause.  HRT use: 0 years. Colonoscopy: no; not examined. Mammogram within the last year: no. Number of breast biopsies: 0. Up to date with pelvic exams:  yes. Any excessive radiation exposure in the past:  No, but reports some secondhand smoke exposure as a kid (parents were smokers)  Past Medical History  Diagnosis Date  . Allergy   . Asthma     exercise induced    Past Surgical History  Procedure Laterality Date  . Wisdom tooth extraction  2013    Social History   Social History  . Marital Status: Legally Separated    Spouse Name: N/A  . Number of Children: N/A  . Years of Education: N/A   Social History Main Topics  . Smoking  status: Never Smoker   . Smokeless tobacco: Never Used     Comment: some hookah in college  . Alcohol Use: Yes     Comment: 1-2 nights week socially  . Drug Use: No  . Sexual Activity: No   Other Topics Concern  . Not on file   Social History Narrative     FAMILY HISTORY:  We obtained a detailed, 4-generation family history.  Significant diagnoses are listed below: Family History  Problem Relation Age of Onset  . Diabetes Father   . Congestive Heart Failure Father   . Atrial fibrillation Father   . Crohn's disease Maternal Grandmother   . Breast cancer Paternal Grandmother     dx. 47s  . Skin cancer Paternal Uncle     unspecified type; dx. 63 or younger  . Heart attack Paternal Grandfather     Sheena Miles has one full sister who is currently 47 and has never had cancer.  She has one paternal half-sister who is 80 and one paternal half-brother who is 40--neither have had cancer.  She currently has one nephew who is 68.  Sheena Miles mother is currently 61 and has never been diagnosed with cancer.  Sheena Miles father is 64 and has a history of congestive heart failure, atrial fibrillation, and diabetes mellitus type 2, but who has also never had cancer.  Sheena Miles father had three full brothers and one full sister--all are still living and are between the ages of 52 and 72.  One of Sheena Miles's paternal uncles was diagnosed with an unspecified type of skin cancer at 35 or younger.  Sheena Miles paternal grandmother died at 73; she was diagnosed with breast cancer in her 1s.  This grandmother had one full sister who never had cancer and passed away later in life.  Sheena Miles paternal grandfather died of a heart attack and liver failure (he was a drinker) at the age of 53.  This grandfather had one sister and one brother--neither of whom had cancer.    Sheena Miles mother had three full sisters and four full brothers.  One sister died in her 27s, but did not have cancer.  One brother  died in his 31s from alcohol abuse.  The remaining aunts and uncles are between the ages of 49-64 and have not had cancer.  Sheena Miles reports no cancer for any of her maternal first cousins.  Sheena Miles maternal grandmother died at 17, although she never had cancer.  Sheena Miles maternal grandfather died in his 68s of alcohol abuse.  She is unaware of any additional cancer diagnoses in the family.  Patient's maternal ancestors are of Western European/French descent, and paternal ancestors are of [place] European/Welsh descent. There is no reported Ashkenazi Jewish ancestry. There is no known consanguinity.  GENETIC COUNSELING ASSESSMENT: Sanyia Dini is a 29 y.o. female with a personal and family history of cancer which is somewhat suggestive of hereditary breast and ovarian cancer syndrome and predisposition to cancer. We, therefore, discussed and recommended the following at today's visit.   DISCUSSION: We reviewed the characteristics, features and inheritance patterns of hereditary cancer syndromes, particularly those caused by mutations within the BRCA1/2 and Lynch syndrome genes. We also discussed genetic testing, including the appropriate family members to test, the process of testing, insurance coverage and turn-around-time for results. We discussed the implications of a negative, positive and/or variant of uncertain significant result. We recommended [name] pursue genetic testing for the 24-gene OvaNext Panel through [facility] ([place], [place]).  The OvaNext Panel offered by [facility] includes sequencing and deletion/duplication analysis of the following 23 genes: ATM, BARD1, BRCA1, BRCA2, BRIP1, CDH1, CHEK2, MLH1, MRE11A, MSH2, MSH6, MUTYH, NBN, NF1, PALB2, PMS2, PTEN, RAD50, RAD51C, RAD51D, SMARCA4, and TP53.  This panel also includes deletion/duplication analysis (without sequencing) for one gene, EPCAM.   Based on [name]'s personal history of cancer, she meets medical criteria  for genetic testing. Despite that she meets criteria, she may still have an out of pocket cost. We discussed that if her out of pocket cost for testing is over $100, the laboratory will call and confirm whether she wants to proceed with testing.  If the out of pocket cost of testing is less than $100 she will be billed by the genetic testing laboratory.   Based on the patient's personal and family history, a statistical model (Tyrer-Cuzick risk model)  and literature data were used to estimate her risk of developing breast cancer. This estimates her lifetime risk of developing breast cancer to be approximately 12.1%. This estimation does not take into account any genetic testing results.  The patient's lifetime breast cancer risk is a preliminary estimate based on available information using one of several models endorsed by the [place] (ACS). The ACS recommends consideration of breast MRI screening as an adjunct to mammography for  patients at high risk (defined as 20% or greater lifetime risk). A more detailed breast cancer risk assessment can be considered, if clinically indicated.   PLAN: After considering the risks, benefits, and limitations, Ms. Korpela  provided informed consent to pursue genetic testing and the blood sample was sent to [facility] for analysis of the 24-gene OvaNext Panel test. Results should be available within approximately 3-4 weeks' time, at which point they will be disclosed by telephone to Ms. Weygandt, as will any additional recommendations warranted by these results. Ms. Krogh will receive a summary of her genetic counseling visit and a copy of her results once available. This information will also be available in Epic. We encouraged Ms. Lope to remain in contact with cancer genetics annually so that we can continuously update the family history and inform her of any changes in cancer genetics and testing that may be of benefit for her family. Ms.  Morissette questions were answered to her satisfaction today. Our contact information was provided should additional questions or concerns arise.   Thank you for the referral and allowing [place] to share in the care of your patient.   Jeanine Luz, MS Genetic Counselor [email or web address] Phone: [phone]  The patient was seen for a total of 45 minutes in face-to-face genetic counseling.  This patient was discussed with Drs. Magrinat, Lindi Adie and/or Burr Medico who agrees with the above.    _______________________________________________________________________ For Office Staff:  Number of people involved in session: 2 Was an Intern/ student involved with case: no

## 2015-10-27 NOTE — Patient Instructions (Signed)

## 2015-10-27 NOTE — Telephone Encounter (Signed)
Called and left a message with added appointments °

## 2015-10-31 ENCOUNTER — Telehealth: Payer: Self-pay | Admitting: *Deleted

## 2015-10-31 NOTE — Telephone Encounter (Signed)
Left message on voicemail with appointmentt details. Pt is scheduled to see Dr Alycia Rossetti on 11/30/15 @ 3:30.

## 2015-11-04 ENCOUNTER — Other Ambulatory Visit: Payer: Self-pay

## 2015-11-04 ENCOUNTER — Telehealth: Payer: Self-pay | Admitting: Genetic Counselor

## 2015-11-04 DIAGNOSIS — Z23 Encounter for immunization: Secondary | ICD-10-CM

## 2015-11-04 DIAGNOSIS — C569 Malignant neoplasm of unspecified ovary: Secondary | ICD-10-CM

## 2015-11-04 MED ORDER — LORAZEPAM 0.5 MG PO TABS: 0.5000 mg | ORAL_TABLET | Freq: Three times a day (TID) | ORAL | Status: AC | PRN

## 2015-11-04 NOTE — Telephone Encounter (Signed)
Let Sheena Miles know that [facility] Shelling says her OOP is calculated correctly, that it is still estimate at over $2000, but that filling out the financial form could get the cost down.  So we are disregarding the form, I have asked [facility] to send her sample to [facility] instead.  I also called [facility] to ensure this was okay and to make sure I was submitting all the paperwork that I needed to.  Ms. Lindor has granted me her consent to proceed with this process and with sending her paperwork to [facility].  We discussed that there is a similar testing panel to that of [facility]'s 24-gene OvaNext Panel.  It is called the [facility] Breast and Gyn Cancers Panel and includes analysis of 23 genes.  However, they also offer an additional 12 "preliminary evidence genes" for no additional cost.  We discussed what preliminary evidence means and whether she would like to add these.  Ms. Flemings would like to add these, so we will order the 35-gene [facility] Breast and Gyn Cancers Panel.  I will update her on this process next week, and she is welcome to call me with any questions.

## 2015-11-04 NOTE — Telephone Encounter (Signed)
Ms. Susi called to let me know that Althia Forts is estimating her OOP to be over $2000.  She has a financial assistance form that she can fill out, but got the impression that it would not really get that cost down very far.  I told her I would call [facility] and see what I could find out.  Called [facility] and spoke with insurance/billing.  They think that Ms. Diegel should still fill out the form and potentially that could get her cost down.  Ms. Vohra has had infusion treatments since the beginning of October, I think it could be possible that her remaining deductible should be much lower than this cost of testing is suggesting.  I emailed our [employer] rep to find out if this is the case.  She will get back to me.  I called Ms. Souder and gave her these updates.  Told her not to fill out the financial form because we will either find out that her OOP has been incorrectly estimated, or, if not and her OOP is still expensive, we will have [facility] send sample to a different lab.  I will keep her in the loop, as I find out more.

## 2015-11-04 NOTE — Telephone Encounter (Signed)
Patient's mother notified that the Ativan prescription was sent to the pharmacy.

## 2015-11-07 ENCOUNTER — Ambulatory Visit (HOSPITAL_BASED_OUTPATIENT_CLINIC_OR_DEPARTMENT_OTHER): Payer: BC Managed Care – PPO

## 2015-11-07 ENCOUNTER — Telehealth: Payer: Self-pay | Admitting: Genetic Counselor

## 2015-11-07 ENCOUNTER — Other Ambulatory Visit (HOSPITAL_BASED_OUTPATIENT_CLINIC_OR_DEPARTMENT_OTHER): Payer: BC Managed Care – PPO

## 2015-11-07 VITALS — BP 141/85 | HR 99 | Temp 98.1°F | Resp 18

## 2015-11-07 DIAGNOSIS — C569 Malignant neoplasm of unspecified ovary: Secondary | ICD-10-CM | POA: Diagnosis not present

## 2015-11-07 DIAGNOSIS — Z5111 Encounter for antineoplastic chemotherapy: Secondary | ICD-10-CM | POA: Diagnosis not present

## 2015-11-07 LAB — COMPREHENSIVE METABOLIC PANEL (CC13)
ALBUMIN: 3.7 g/dL (ref 3.5–5.0)
ALK PHOS: 92 U/L (ref 40–150)
ALT: 23 U/L (ref 0–55)
ANION GAP: 12 meq/L — AB (ref 3–11)
AST: 19 U/L (ref 5–34)
BUN: 13.4 mg/dL (ref 7.0–26.0)
CALCIUM: 10.1 mg/dL (ref 8.4–10.4)
CO2: 21 mEq/L — ABNORMAL LOW (ref 22–29)
Chloride: 108 mEq/L (ref 98–109)
Creatinine: 0.7 mg/dL (ref 0.6–1.1)
Glucose: 127 mg/dl (ref 70–140)
POTASSIUM: 3.9 meq/L (ref 3.5–5.1)
SODIUM: 141 meq/L (ref 136–145)
TOTAL PROTEIN: 7.3 g/dL (ref 6.4–8.3)

## 2015-11-07 LAB — CBC WITH DIFFERENTIAL/PLATELET
BASO%: 0.3 % (ref 0.0–2.0)
Basophils Absolute: 0 10*3/uL (ref 0.0–0.1)
EOS%: 0.1 % (ref 0.0–7.0)
Eosinophils Absolute: 0 10*3/uL (ref 0.0–0.5)
HEMATOCRIT: 35 % (ref 34.8–46.6)
HEMOGLOBIN: 11.3 g/dL — AB (ref 11.6–15.9)
LYMPH#: 0.5 10*3/uL — AB (ref 0.9–3.3)
LYMPH%: 8.9 % — ABNORMAL LOW (ref 14.0–49.7)
MCH: 27.5 pg (ref 25.1–34.0)
MCHC: 32.2 g/dL (ref 31.5–36.0)
MCV: 85.3 fL (ref 79.5–101.0)
MONO#: 0.1 10*3/uL (ref 0.1–0.9)
MONO%: 1 % (ref 0.0–14.0)
NEUT#: 4.6 10*3/uL (ref 1.5–6.5)
NEUT%: 89.7 % — AB (ref 38.4–76.8)
PLATELETS: 506 10*3/uL — AB (ref 145–400)
RBC: 4.11 10*6/uL (ref 3.70–5.45)
RDW: 22.5 % — AB (ref 11.2–14.5)
WBC: 5.1 10*3/uL (ref 3.9–10.3)

## 2015-11-07 MED ORDER — PALONOSETRON HCL INJECTION 0.25 MG/5ML
INTRAVENOUS | Status: AC
  Filled 2015-11-07: qty 5

## 2015-11-07 MED ORDER — FAMOTIDINE IN NACL 20-0.9 MG/50ML-% IV SOLN
INTRAVENOUS | Status: AC
  Filled 2015-11-07: qty 50

## 2015-11-07 MED ORDER — SODIUM CHLORIDE 0.9 % IV SOLN
Freq: Once | INTRAVENOUS | Status: AC
  Administered 2015-11-07: 12:00:00 via INTRAVENOUS

## 2015-11-07 MED ORDER — DIPHENHYDRAMINE HCL 50 MG/ML IJ SOLN
INTRAMUSCULAR | Status: AC
  Filled 2015-11-07: qty 1

## 2015-11-07 MED ORDER — FAMOTIDINE IN NACL 20-0.9 MG/50ML-% IV SOLN
20.0000 mg | Freq: Once | INTRAVENOUS | Status: AC
  Administered 2015-11-07: 20 mg via INTRAVENOUS

## 2015-11-07 MED ORDER — SODIUM CHLORIDE 0.9 % IV SOLN: Freq: Once | INTRAVENOUS | Status: DC

## 2015-11-07 MED ORDER — HEPARIN SOD (PORK) LOCK FLUSH 100 UNIT/ML IV SOLN
500.0000 [IU] | Freq: Once | INTRAVENOUS | Status: AC | PRN
  Administered 2015-11-07: 500 [IU]
  Filled 2015-11-07: qty 5

## 2015-11-07 MED ORDER — SODIUM CHLORIDE 0.9 % IJ SOLN
10.0000 mL | INTRAMUSCULAR | Status: DC | PRN
  Administered 2015-11-07: 10 mL
  Filled 2015-11-07: qty 10

## 2015-11-07 MED ORDER — PALONOSETRON HCL INJECTION 0.25 MG/5ML
0.2500 mg | Freq: Once | INTRAVENOUS | Status: AC
  Administered 2015-11-07: 0.25 mg via INTRAVENOUS

## 2015-11-07 MED ORDER — DIPHENHYDRAMINE HCL 50 MG/ML IJ SOLN
50.0000 mg | Freq: Once | INTRAMUSCULAR | Status: AC
  Administered 2015-11-07: 50 mg via INTRAVENOUS

## 2015-11-07 MED ORDER — FOSAPREPITANT DIMEGLUMINE INJECTION 150 MG
Freq: Once | INTRAVENOUS | Status: AC
  Administered 2015-11-07: 13:00:00 via INTRAVENOUS
  Filled 2015-11-07: qty 5

## 2015-11-07 MED ORDER — SODIUM CHLORIDE 0.9 % IV SOLN
900.0000 mg | Freq: Once | INTRAVENOUS | Status: AC
  Administered 2015-11-07: 900 mg via INTRAVENOUS
  Filled 2015-11-07: qty 90

## 2015-11-07 MED ORDER — PACLITAXEL CHEMO INJECTION 300 MG/50ML
175.0000 mg/m2 | Freq: Once | INTRAVENOUS | Status: AC
  Administered 2015-11-07: 384 mg via INTRAVENOUS
  Filled 2015-11-07: qty 64

## 2015-11-07 NOTE — Telephone Encounter (Signed)
Discussed with Sheena Miles that the lab representative at [facility] called me and stated he felt very confident that filling out the financial form could bring the OOP cost down exceedingly for her.  I let him know that I would give her this information and see how she would like to proceed.  Sheena Miles reviewed the form, said that she would complete it and send it back to [facility].  We are both interested in seeing Sheena Miles's response as far as her new OOP cost.  We will absolutely send that sample to [facility] in the event this cost is still unreasonable.  Sheena Miles knows she can contact me with any further questions and the lab will be in touch with her regarding her new OOP, as soon as they re-evaluate.

## 2015-11-07 NOTE — Patient Instructions (Signed)
Cancer Center Discharge Instructions for Patients Receiving Chemotherapy  Today you received the following chemotherapy agents Taxol and Carboplatin. To help prevent nausea and vomiting after your treatment, we encourage you to take your nausea medication as directed.  If you develop nausea and vomiting that is not controlled by your nausea medication, call the clinic.   BELOW ARE SYMPTOMS THAT SHOULD BE REPORTED IMMEDIATELY:  *FEVER GREATER THAN 100.5 F  *CHILLS WITH OR WITHOUT FEVER  NAUSEA AND VOMITING THAT IS NOT CONTROLLED WITH YOUR NAUSEA MEDICATION  *UNUSUAL SHORTNESS OF BREATH  *UNUSUAL BRUISING OR BLEEDING  TENDERNESS IN MOUTH AND THROAT WITH OR WITHOUT PRESENCE OF ULCERS  *URINARY PROBLEMS  *BOWEL PROBLEMS  UNUSUAL RASH Items with * indicate a potential emergency and should be followed up as soon as possible.  Feel free to call the clinic you have any questions or concerns. The clinic phone number is [phone].  Please show the CHEMO ALERT CARD at check-in to the Emergency Department and triage nurse.    

## 2015-11-08 ENCOUNTER — Telehealth: Payer: Self-pay | Admitting: *Deleted

## 2015-11-08 LAB — CA 125: CA 125: 662 U/mL — ABNORMAL HIGH (ref <35)

## 2015-11-08 NOTE — Telephone Encounter (Signed)
TC from patient @ 8:42 am requesting results of yesterday's  Labs CA 125. Results given to patient. Much improved results. Pt voiced understanding.  No other issues identified.

## 2015-11-11 ENCOUNTER — Telehealth: Payer: Self-pay | Admitting: Genetic Counselor

## 2015-11-14 ENCOUNTER — Ambulatory Visit (HOSPITAL_BASED_OUTPATIENT_CLINIC_OR_DEPARTMENT_OTHER): Payer: BC Managed Care – PPO

## 2015-11-14 VITALS — BP 123/73 | HR 101 | Temp 98.5°F

## 2015-11-14 DIAGNOSIS — Z5189 Encounter for other specified aftercare: Secondary | ICD-10-CM | POA: Diagnosis not present

## 2015-11-14 DIAGNOSIS — C569 Malignant neoplasm of unspecified ovary: Secondary | ICD-10-CM

## 2015-11-14 MED ORDER — TBO-FILGRASTIM 480 MCG/0.8ML ~~LOC~~ SOSY
480.0000 ug | PREFILLED_SYRINGE | Freq: Once | SUBCUTANEOUS | Status: AC
  Administered 2015-11-14: 480 ug via SUBCUTANEOUS
  Filled 2015-11-14: qty 0.8

## 2015-11-16 ENCOUNTER — Other Ambulatory Visit: Payer: Self-pay | Admitting: Oncology

## 2015-11-16 DIAGNOSIS — C569 Malignant neoplasm of unspecified ovary: Secondary | ICD-10-CM

## 2015-11-17 ENCOUNTER — Encounter: Payer: Self-pay | Admitting: Oncology

## 2015-11-17 ENCOUNTER — Telehealth: Payer: Self-pay | Admitting: Oncology

## 2015-11-17 ENCOUNTER — Ambulatory Visit (HOSPITAL_BASED_OUTPATIENT_CLINIC_OR_DEPARTMENT_OTHER): Payer: BC Managed Care – PPO | Admitting: Oncology

## 2015-11-17 ENCOUNTER — Other Ambulatory Visit (HOSPITAL_BASED_OUTPATIENT_CLINIC_OR_DEPARTMENT_OTHER): Payer: BC Managed Care – PPO

## 2015-11-17 ENCOUNTER — Ambulatory Visit (HOSPITAL_BASED_OUTPATIENT_CLINIC_OR_DEPARTMENT_OTHER): Payer: BC Managed Care – PPO

## 2015-11-17 VITALS — BP 109/70 | HR 98 | Temp 98.1°F | Resp 19 | Ht 65.0 in | Wt 244.8 lb

## 2015-11-17 DIAGNOSIS — C569 Malignant neoplasm of unspecified ovary: Secondary | ICD-10-CM

## 2015-11-17 DIAGNOSIS — D701 Agranulocytosis secondary to cancer chemotherapy: Secondary | ICD-10-CM

## 2015-11-17 DIAGNOSIS — J0140 Acute pansinusitis, unspecified: Secondary | ICD-10-CM

## 2015-11-17 DIAGNOSIS — R112 Nausea with vomiting, unspecified: Secondary | ICD-10-CM | POA: Diagnosis not present

## 2015-11-17 DIAGNOSIS — H6692 Otitis media, unspecified, left ear: Secondary | ICD-10-CM

## 2015-11-17 DIAGNOSIS — J0120 Acute ethmoidal sinusitis, unspecified: Secondary | ICD-10-CM

## 2015-11-17 DIAGNOSIS — T451X5A Adverse effect of antineoplastic and immunosuppressive drugs, initial encounter: Principal | ICD-10-CM

## 2015-11-17 LAB — CBC WITH DIFFERENTIAL/PLATELET
BASO%: 0.3 % (ref 0.0–2.0)
BASOS ABS: 0 10*3/uL (ref 0.0–0.1)
EOS%: 2.5 % (ref 0.0–7.0)
Eosinophils Absolute: 0.1 10*3/uL (ref 0.0–0.5)
HCT: 31.6 % — ABNORMAL LOW (ref 34.8–46.6)
HEMOGLOBIN: 10.4 g/dL — AB (ref 11.6–15.9)
LYMPH#: 2.1 10*3/uL (ref 0.9–3.3)
LYMPH%: 58.6 % — ABNORMAL HIGH (ref 14.0–49.7)
MCH: 28.5 pg (ref 25.1–34.0)
MCHC: 32.9 g/dL (ref 31.5–36.0)
MCV: 86.6 fL (ref 79.5–101.0)
MONO#: 1 10*3/uL — ABNORMAL HIGH (ref 0.1–0.9)
MONO%: 27.3 % — AB (ref 0.0–14.0)
NEUT#: 0.4 10*3/uL — CL (ref 1.5–6.5)
NEUT%: 11.3 % — ABNORMAL LOW (ref 38.4–76.8)
Platelets: 296 10*3/uL (ref 145–400)
RBC: 3.65 10*6/uL — ABNORMAL LOW (ref 3.70–5.45)
RDW: 19.8 % — AB (ref 11.2–14.5)
WBC: 3.6 10*3/uL — ABNORMAL LOW (ref 3.9–10.3)

## 2015-11-17 MED ORDER — AMOXICILLIN 500 MG PO TABS: 500.0000 mg | ORAL_TABLET | Freq: Three times a day (TID) | ORAL | Status: DC

## 2015-11-17 MED ORDER — TBO-FILGRASTIM 480 MCG/0.8ML ~~LOC~~ SOSY
480.0000 ug | PREFILLED_SYRINGE | Freq: Once | SUBCUTANEOUS | Status: AC
  Administered 2015-11-17: 480 ug via SUBCUTANEOUS
  Filled 2015-11-17: qty 0.8

## 2015-11-17 NOTE — Patient Instructions (Signed)
Need to stay out of school on 11-18 due to low blood counts.  Call if temperature >=100.5, purulent nasal drainage, chills or other symptoms of infection while blood counts are low

## 2015-11-17 NOTE — Progress Notes (Signed)
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 She had cycle 3 chemotherapy on 11-07-15 with granix x1 on 11-14-15, but is neutropenic today. She has had sinus congestion with clear drainage, no fever.   Nausea was better controlled on days 2 and 3 with aloxi and emend given this cycle; she had some nausea subsequently but was able to eat and drink fluids.  Taxol aches were worst on days 4 and 5, resolving by day 6; she used dilaudid for the aches, which did help. Bowels have been moving. The wound is healed. She has had clear rhinorrhea x 1 week, occasional cough, not SOB, right ear discomfort once, no fever, using nasocort and claritin.  No bleeding. No peripheral neuropathy. No other symptoms of infection. Fatigue has been manageable. She has worked this week, including today. Remainder of 10 point Review of Systems negative.  Power PAC placed at [facility] 09-09-15 Flu vaccine 10-27-15 Genetics counseling 10-27-15.  ONCOLOGIC HISTORY Patient initially developed constipation and abdominal pain summer 2016 which she thought was stress related, then worsening abdominal and pelvic pain, some back pain and some fever; she had weight gain ~ 30 lbs in prior 5-6 months. She was seen at [facility] with concern for acute appendicitis. She was evaluated at local ED, I believe [facility], with CT reportedly showing large left and small right pleural effusions, moderate ascites, complex septated cystic mass in low pelvis into left abdomen 13 x 7 x 7 cm, adenopathy Including large diaphragmatic lymph nodes anterior to heart and numerous mesenteric nodes, and apparent carcinomatosis with implants in omentum up to 3.8 cm. She was seen by Dr [name] and  referred to Dr Alycia Rossetti. At Dr Elenora Gamma exam 08-31-15 there was large mass in cul de sac and abdominal fluid wave, decreased BS left lower 1/2 of chest. Lab studies included CA 125 33,145; inhibin B <10; CEA <0.5, AFP 2.2, LDH 1281 and HCG negative. Surgery by Dr Alycia Rossetti at [facility] on 09-06-15 was exploratory laparotomy with supracervical hysterectomy, BSO, omentectomy, resection with primary reanastomosis of rectosigmoid and peritoneal stripping. Intraoperative findings included 3 liters of bloody ascites, 12 cm omental mass adherent to uterus, sigmoid colon and rectum which was resected en bloc, bladder nodule resected. At completion of surgery there was residual tumor along entirety of small bowel mesentery with multiple nodules up to 1.5 cm, miliary disease on diaphragm, and peritoneal nodularity along pelvis and rectum (R2 resection). Pathology [facility] [id number] high grade serous carcinoma of bilateral ovaries involving bilateral tubes, uterine wall anterior and posterior and lower uterine segment, omentum, colon. No nodes submitted.  [facility] discharge summary is not presently available, however she was transfused 2 units PRBCs on each 9-9 and 09-13-15 for hemoglobin as low as 6.8 - 7.1. She had urgent right thoracentesis for 750 cc on 09-08-15 with cytology positive for high grade serous carcinoma [id number]) and left thoracentesis for 400 cc on 09-09-15. CT angio chest on POD 1 was negative for PE. She tolerated morphine by PCA post operatively. She had mild cellulitis at abdominal incision on day of DC, begun on Keflex then. Psychiatry saw in hospital, begun on zoloft. She was discharged home with 28 days of lovenox. She saw Dr Alycia Rossetti on 09-19-15 with staples removed, however wound opened  that evening. She was seen by gyn onc provider on 09-23-15, with wound open 3 x 5 cm, 3 cm deep, no evidence of infection. Wet to dry dressings bid were begun, ongoing. She had follow up wound check on 09-27-15, no findings of concern. GOG  protocol at [facility] had delay, so that treatment given off protocol with first cycle of carboplatin taxol at [facility] on 09-26-15, with carboplatin 900 mg total dose, taxol at 175 mg/m2 = 399 mg, decadron 12 mg, Aloxi 250 mg. EMEND 150 mg, benadryl 25 mg and pepcid 40 mg. She did not take oral decadron premedication for the taxol. She had severe, generalized aching beginning night of day 3 thru day 4      Objective:  Vital signs in last 24 hours:  BP 109/70 mmHg  Pulse 98  Temp(Src) 98.1 F (36.7 C) (Oral)  Resp 19  Ht 5\' 5"  (1.651 m)  Wt 244 lb 12.8 oz (111.041 kg)  BMI 40.74 kg/m2  SpO2 100% Weight is up 6 lbs Alert, oriented and appropriate. Ambulatory without difficulty. Sounds slightly nasally congested, minimal cough x1 during exam, respirations not labored RA. Does not look acutely ill. Alopecia  HEENT:PERRL, sclerae not icteric. Oral mucosa moist without lesions, posterior pharynx with dull erythema no exudate. Nasal turbinates some erythema and congestion, no purulent drainage. Left TM dull and slight erythema, right TM clear.  Neck supple. No JVD.  Lymphatics:no cervical,supraclavicular adenopathy Resp: clear to auscultation bilaterally and normal percussion bilaterally Cardio: regular rate and rhythm. No gallop. GI: abdomen obese, soft, nontender, not distended. Normally active bowel sounds. Surgical incision closed other than 0.5 cm minimally not re-epithelialized no tenderness, no drainage, no surrounding erythema.  Musculoskeletal/ Extremities: without pitting edema, cords, tenderness Neuro: no peripheral neuropathy. Otherwise nonfocal. PSYCH appropriate mood and affect Skin without rash, ecchymosis, petechiae Portacath-without erythema or tenderness  Lab Results:  Results for orders placed or performed in visit on 11/17/15  CBC with Differential  Result Value Ref Range   WBC 3.6 (L) 3.9 - 10.3 10e3/uL   NEUT# 0.4 (LL) 1.5 - 6.5 10e3/uL   HGB 10.4 (L) 11.6 - 15.9 g/dL    HCT 31.6 (L) 34.8 - 46.6 %   Platelets 296 145 - 400 10e3/uL   MCV 86.6 79.5 - 101.0 fL   MCH 28.5 25.1 - 34.0 pg   MCHC 32.9 31.5 - 36.0 g/dL   RBC 3.65 (L) 3.70 - 5.45 10e6/uL   RDW 19.8 (H) 11.2 - 14.5 %   lymph# 2.1 0.9 - 3.3 10e3/uL   MONO# 1.0 (H) 0.1 - 0.9 10e3/uL   Eosinophils Absolute 0.1 0.0 - 0.5 10e3/uL   Basophils Absolute 0.0 0.0 - 0.1 10e3/uL   NEUT% 11.3 (L) 38.4 - 76.8 %   LYMPH% 58.6 (H) 14.0 - 49.7 %   MONO% 27.3 (H) 0.0 - 14.0 %   EOS% 2.5 0.0 - 7.0 %   BASO% 0.3 0.0 - 2.0 %     Studies/Results:  No results found.  Medications: I have reviewed the patient's current medications. Amoxicillin 500 mg q 8 hrs x 7 days for sinusitis and left otits, in setting of chemo neutropenia. Granix 480 given today and will be repeated 11-19, then also 11-21 if ANC < 1.2  DISCUSSION: Neutropenia and neutropenic precautions discussed, URI symptoms discussed, which may have begun as allergies but concern for sinusitis and otitis in setting of neutropenia now and will use antibiotics. Have discussed using neulasta instead of granix, which would be  easier with travel from [place] and work schedule, and may be more beneficial for chemo neutropenia; she understands that she may have more aches with neulasta, but is also willing to have injection on day 6, when taxol aches have begun to lessen.  Will need to put in neulasta order and get preauth for next cycle as soon as present granix is completed based on counts.   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 Malignant pleural fluid and ascites at presentation. Neutropenic today post cycle 3 11-07-15, granix and neutropenic precautions. Will change gCSF to neulasta on day 6 of next cycle.   Genetics testing in process. 2.URI symptoms: likely allergic initially, but neutropenic and concern for sinusitis and early left  otitis now. Amoxicillin in addition to gCSF, understands that she needs to call and would need IV antibiotics if febrile or worsening symptoms of infection. 3.surgical menopause  4.flu vaccine 10-10-15 5.power PAC in 6.chemo nausea: better with both aloxi and emend this cycle, continue 7.recent social stress with divorce. 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  8.hemorrhoid discomfort: not as uncomfortable. Note rectosigmoid resection with reanastomosis at debulking surgery. 9.mild chemo anemia, iron studies ok 09-30-15. Follow counts. 10. Surgical wound now healed following initial dehiscence.  All questions answered. Granix orders placed. See above re neulasta orders and preauth. Time spent 30 min including >50% counseling and coordination of care. Cc Drs Alycia Rossetti and [facility] to update.     Jacarius Handel P, MD   11/17/2015, 9:52 PM

## 2015-11-18 ENCOUNTER — Ambulatory Visit (HOSPITAL_BASED_OUTPATIENT_CLINIC_OR_DEPARTMENT_OTHER): Payer: BC Managed Care – PPO

## 2015-11-18 VITALS — BP 116/96 | HR 97 | Temp 98.2°F

## 2015-11-18 DIAGNOSIS — C561 Malignant neoplasm of right ovary: Secondary | ICD-10-CM

## 2015-11-18 DIAGNOSIS — T451X5A Adverse effect of antineoplastic and immunosuppressive drugs, initial encounter: Principal | ICD-10-CM

## 2015-11-18 DIAGNOSIS — D701 Agranulocytosis secondary to cancer chemotherapy: Secondary | ICD-10-CM | POA: Insufficient documentation

## 2015-11-18 DIAGNOSIS — C569 Malignant neoplasm of unspecified ovary: Secondary | ICD-10-CM

## 2015-11-18 MED ORDER — TBO-FILGRASTIM 480 MCG/0.8ML ~~LOC~~ SOSY
480.0000 ug | PREFILLED_SYRINGE | Freq: Once | SUBCUTANEOUS | Status: AC
  Administered 2015-11-18: 480 ug via SUBCUTANEOUS
  Filled 2015-11-18: qty 0.8

## 2015-11-19 ENCOUNTER — Ambulatory Visit (HOSPITAL_BASED_OUTPATIENT_CLINIC_OR_DEPARTMENT_OTHER): Payer: BC Managed Care – PPO

## 2015-11-19 VITALS — BP 122/68 | HR 103 | Temp 98.6°F

## 2015-11-19 DIAGNOSIS — D701 Agranulocytosis secondary to cancer chemotherapy: Secondary | ICD-10-CM

## 2015-11-19 DIAGNOSIS — C569 Malignant neoplasm of unspecified ovary: Secondary | ICD-10-CM

## 2015-11-19 MED ORDER — TBO-FILGRASTIM 480 MCG/0.8ML ~~LOC~~ SOSY
480.0000 ug | PREFILLED_SYRINGE | Freq: Once | SUBCUTANEOUS | Status: AC
  Administered 2015-11-19: 480 ug via SUBCUTANEOUS

## 2015-11-19 NOTE — Patient Instructions (Signed)

## 2015-11-21 ENCOUNTER — Other Ambulatory Visit: Payer: Self-pay | Admitting: Oncology

## 2015-11-21 ENCOUNTER — Other Ambulatory Visit (HOSPITAL_BASED_OUTPATIENT_CLINIC_OR_DEPARTMENT_OTHER): Payer: BC Managed Care – PPO

## 2015-11-21 ENCOUNTER — Ambulatory Visit: Payer: BC Managed Care – PPO

## 2015-11-21 DIAGNOSIS — C569 Malignant neoplasm of unspecified ovary: Secondary | ICD-10-CM

## 2015-11-21 LAB — CBC WITH DIFFERENTIAL/PLATELET
BASO%: 0.9 % (ref 0.0–2.0)
BASOS ABS: 0.1 10*3/uL (ref 0.0–0.1)
EOS ABS: 0.1 10*3/uL (ref 0.0–0.5)
EOS%: 0.6 % (ref 0.0–7.0)
HEMATOCRIT: 38 % (ref 34.8–46.6)
HGB: 12.3 g/dL (ref 11.6–15.9)
LYMPH%: 28.8 % (ref 14.0–49.7)
MCH: 28 pg (ref 25.1–34.0)
MCHC: 32.3 g/dL (ref 31.5–36.0)
MCV: 86.6 fL (ref 79.5–101.0)
MONO#: 1 10*3/uL — ABNORMAL HIGH (ref 0.1–0.9)
MONO%: 9.8 % (ref 0.0–14.0)
NEUT#: 6 10*3/uL (ref 1.5–6.5)
NEUT%: 59.9 % (ref 38.4–76.8)
PLATELETS: 289 10*3/uL (ref 145–400)
RBC: 4.38 10*6/uL (ref 3.70–5.45)
RDW: 22.6 % — ABNORMAL HIGH (ref 11.2–14.5)
WBC: 10 10*3/uL (ref 3.9–10.3)
lymph#: 2.9 10*3/uL (ref 0.9–3.3)

## 2015-11-21 NOTE — Progress Notes (Signed)
Shaida here foe lab and possible Granix injection.  ANC 6.0 today   Per Dr Curlene Dolphin she doesn;t need any granix today.  Complete antibiotics and resume regular activities.  Return as scheduled.

## 2015-11-24 ENCOUNTER — Other Ambulatory Visit: Payer: Self-pay

## 2015-11-24 DIAGNOSIS — C569 Malignant neoplasm of unspecified ovary: Secondary | ICD-10-CM

## 2015-11-25 ENCOUNTER — Other Ambulatory Visit: Payer: Self-pay | Admitting: Oncology

## 2015-11-25 DIAGNOSIS — C569 Malignant neoplasm of unspecified ovary: Secondary | ICD-10-CM

## 2015-11-28 ENCOUNTER — Ambulatory Visit (HOSPITAL_BASED_OUTPATIENT_CLINIC_OR_DEPARTMENT_OTHER): Payer: BC Managed Care – PPO

## 2015-11-28 ENCOUNTER — Other Ambulatory Visit (HOSPITAL_BASED_OUTPATIENT_CLINIC_OR_DEPARTMENT_OTHER): Payer: BC Managed Care – PPO

## 2015-11-28 ENCOUNTER — Ambulatory Visit: Payer: BC Managed Care – PPO

## 2015-11-28 VITALS — BP 143/82 | HR 107 | Temp 98.4°F

## 2015-11-28 DIAGNOSIS — Z5111 Encounter for antineoplastic chemotherapy: Secondary | ICD-10-CM

## 2015-11-28 DIAGNOSIS — Z95828 Presence of other vascular implants and grafts: Secondary | ICD-10-CM

## 2015-11-28 DIAGNOSIS — C569 Malignant neoplasm of unspecified ovary: Secondary | ICD-10-CM

## 2015-11-28 LAB — CBC WITH DIFFERENTIAL/PLATELET
BASO%: 0 % (ref 0.0–2.0)
Basophils Absolute: 0 10*3/uL (ref 0.0–0.1)
EOS%: 0 % (ref 0.0–7.0)
Eosinophils Absolute: 0 10*3/uL (ref 0.0–0.5)
HEMATOCRIT: 34.9 % (ref 34.8–46.6)
HEMOGLOBIN: 11.6 g/dL (ref 11.6–15.9)
LYMPH%: 5.2 % — ABNORMAL LOW (ref 14.0–49.7)
MCH: 28.6 pg (ref 25.1–34.0)
MCHC: 33.2 g/dL (ref 31.5–36.0)
MCV: 86 fL (ref 79.5–101.0)
MONO#: 0 10*3/uL — ABNORMAL LOW (ref 0.1–0.9)
MONO%: 0.1 % (ref 0.0–14.0)
NEUT%: 94.7 % — ABNORMAL HIGH (ref 38.4–76.8)
NEUTROS ABS: 9.8 10*3/uL — AB (ref 1.5–6.5)
Platelets: 339 10*3/uL (ref 145–400)
RBC: 4.06 10*6/uL (ref 3.70–5.45)
RDW: 19.1 % — ABNORMAL HIGH (ref 11.2–14.5)
WBC: 10.3 10*3/uL (ref 3.9–10.3)
lymph#: 0.5 10*3/uL — ABNORMAL LOW (ref 0.9–3.3)

## 2015-11-28 LAB — COMPREHENSIVE METABOLIC PANEL (CC13)
ALBUMIN: 3.9 g/dL (ref 3.5–5.0)
ALK PHOS: 103 U/L (ref 40–150)
ALT: 18 U/L (ref 0–55)
AST: 14 U/L (ref 5–34)
Anion Gap: 13 mEq/L — ABNORMAL HIGH (ref 3–11)
BILIRUBIN TOTAL: 0.3 mg/dL (ref 0.20–1.20)
BUN: 16.7 mg/dL (ref 7.0–26.0)
CALCIUM: 10.7 mg/dL — AB (ref 8.4–10.4)
CO2: 22 mEq/L (ref 22–29)
Chloride: 105 mEq/L (ref 98–109)
Creatinine: 0.7 mg/dL (ref 0.6–1.1)
GLUCOSE: 123 mg/dL (ref 70–140)
Potassium: 4.2 mEq/L (ref 3.5–5.1)
SODIUM: 140 meq/L (ref 136–145)
TOTAL PROTEIN: 8.2 g/dL (ref 6.4–8.3)

## 2015-11-28 MED ORDER — CARBOPLATIN CHEMO INJECTION 600 MG/60ML
900.0000 mg | Freq: Once | INTRAVENOUS | Status: AC
  Administered 2015-11-28: 900 mg via INTRAVENOUS
  Filled 2015-11-28: qty 90

## 2015-11-28 MED ORDER — PACLITAXEL CHEMO INJECTION 300 MG/50ML
175.0000 mg/m2 | Freq: Once | INTRAVENOUS | Status: AC
  Administered 2015-11-28: 384 mg via INTRAVENOUS
  Filled 2015-11-28: qty 64

## 2015-11-28 MED ORDER — FAMOTIDINE IN NACL 20-0.9 MG/50ML-% IV SOLN
INTRAVENOUS | Status: AC
  Filled 2015-11-28: qty 50

## 2015-11-28 MED ORDER — HEPARIN SOD (PORK) LOCK FLUSH 100 UNIT/ML IV SOLN
500.0000 [IU] | Freq: Once | INTRAVENOUS | Status: AC | PRN
  Administered 2015-11-28: 500 [IU]
  Filled 2015-11-28: qty 5

## 2015-11-28 MED ORDER — FAMOTIDINE IN NACL 20-0.9 MG/50ML-% IV SOLN
20.0000 mg | Freq: Once | INTRAVENOUS | Status: AC
  Administered 2015-11-28: 20 mg via INTRAVENOUS

## 2015-11-28 MED ORDER — SODIUM CHLORIDE 0.9 % IJ SOLN
10.0000 mL | INTRAMUSCULAR | Status: DC | PRN
  Administered 2015-11-28: 10 mL via INTRAVENOUS
  Filled 2015-11-28: qty 10

## 2015-11-28 MED ORDER — DIPHENHYDRAMINE HCL 50 MG/ML IJ SOLN
INTRAMUSCULAR | Status: AC
  Filled 2015-11-28: qty 1

## 2015-11-28 MED ORDER — SODIUM CHLORIDE 0.9 % IJ SOLN
10.0000 mL | INTRAMUSCULAR | Status: DC | PRN
  Administered 2015-11-28: 10 mL
  Filled 2015-11-28: qty 10

## 2015-11-28 MED ORDER — PALONOSETRON HCL INJECTION 0.25 MG/5ML
INTRAVENOUS | Status: AC
  Filled 2015-11-28: qty 5

## 2015-11-28 MED ORDER — SODIUM CHLORIDE 0.9 % IV SOLN
Freq: Once | INTRAVENOUS | Status: AC
  Administered 2015-11-28: 13:00:00 via INTRAVENOUS
  Filled 2015-11-28: qty 5

## 2015-11-28 MED ORDER — PALONOSETRON HCL INJECTION 0.25 MG/5ML
0.2500 mg | Freq: Once | INTRAVENOUS | Status: AC
  Administered 2015-11-28: 0.25 mg via INTRAVENOUS

## 2015-11-28 MED ORDER — DIPHENHYDRAMINE HCL 50 MG/ML IJ SOLN
50.0000 mg | Freq: Once | INTRAMUSCULAR | Status: AC
  Administered 2015-11-28: 50 mg via INTRAVENOUS

## 2015-11-28 MED ORDER — SODIUM CHLORIDE 0.9 % IV SOLN
Freq: Once | INTRAVENOUS | Status: AC
  Administered 2015-11-28: 13:00:00 via INTRAVENOUS

## 2015-11-28 NOTE — Patient Instructions (Signed)

## 2015-11-28 NOTE — Patient Instructions (Signed)
[facility] Discharge Instructions for Patients Receiving Chemotherapy  Today you received the following chemotherapy agents Taxol/Carboplatin To help prevent nausea and vomiting after your treatment, we encourage you to take your nausea medication as prescribed.   If you develop nausea and vomiting that is not controlled by your nausea medication, call the clinic.   BELOW ARE SYMPTOMS THAT SHOULD BE REPORTED IMMEDIATELY:  *FEVER GREATER THAN 100.5 F  *CHILLS WITH OR WITHOUT FEVER  NAUSEA AND VOMITING THAT IS NOT CONTROLLED WITH YOUR NAUSEA MEDICATION  *UNUSUAL SHORTNESS OF BREATH  *UNUSUAL BRUISING OR BLEEDING  TENDERNESS IN MOUTH AND THROAT WITH OR WITHOUT PRESENCE OF ULCERS  *URINARY PROBLEMS  *BOWEL PROBLEMS  UNUSUAL RASH Items with * indicate a potential emergency and should be followed up as soon as possible.  Feel free to call the clinic you have any questions or concerns. The clinic phone number is [phone].  Please show the CHEMO ALERT CARD at check-in to the Emergency Department and triage nurse.   

## 2015-11-29 ENCOUNTER — Other Ambulatory Visit: Payer: Self-pay | Admitting: Oncology

## 2015-11-29 ENCOUNTER — Telehealth: Payer: Self-pay | Admitting: *Deleted

## 2015-11-29 LAB — CA 125: CA 125: 244 U/mL — ABNORMAL HIGH (ref <35)

## 2015-11-29 NOTE — Telephone Encounter (Signed)
Per Dr. Marko Plume, patient notified of CA125 results from yesterday.  While on the phone, confirmed that patient did not get Neulasta OnPro placed yesterday afternoon prior to leaving [place] chemo infusion. Patient states she did not. Patient is coming to [facility] tomorrow for appt with Dr. Alycia Rossetti and this RN will give patient injection at this appt - patient agreeable to this.

## 2015-11-30 ENCOUNTER — Encounter: Payer: Self-pay | Admitting: Gynecologic Oncology

## 2015-11-30 ENCOUNTER — Ambulatory Visit: Payer: BC Managed Care – PPO | Attending: Gynecologic Oncology | Admitting: Gynecologic Oncology

## 2015-11-30 ENCOUNTER — Telehealth: Payer: Self-pay | Admitting: Genetic Counselor

## 2015-11-30 ENCOUNTER — Ambulatory Visit (HOSPITAL_BASED_OUTPATIENT_CLINIC_OR_DEPARTMENT_OTHER): Payer: BC Managed Care – PPO

## 2015-11-30 VITALS — HR 80 | Temp 98.6°F | Resp 18 | Ht 65.0 in | Wt 247.6 lb

## 2015-11-30 DIAGNOSIS — C569 Malignant neoplasm of unspecified ovary: Secondary | ICD-10-CM

## 2015-11-30 DIAGNOSIS — C561 Malignant neoplasm of right ovary: Secondary | ICD-10-CM | POA: Diagnosis not present

## 2015-11-30 DIAGNOSIS — D701 Agranulocytosis secondary to cancer chemotherapy: Secondary | ICD-10-CM | POA: Diagnosis not present

## 2015-11-30 MED ORDER — PEGFILGRASTIM INJECTION 6 MG/0.6ML ~~LOC~~
6.0000 mg | PREFILLED_SYRINGE | Freq: Once | SUBCUTANEOUS | Status: AC
  Administered 2015-11-30: 6 mg via SUBCUTANEOUS
  Filled 2015-11-30: qty 0.6

## 2015-11-30 NOTE — Telephone Encounter (Signed)
Discussed with patient and decided to proceed with genetic testing for the 35 [employer] Breast and Gyn Cancers Panel (with preliminary evidence genes) through [employer].  Lab will call if OOP is over $100 for Sheena Miles.  Results will be available in 2-3 weeks.  She is welcome to call with any questions.

## 2015-11-30 NOTE — Telephone Encounter (Signed)
Discussed with Ms. Sheena Miles that her genetic test results were negative for mutations within any of 35 genes that would cause her to be at an increased risk for breast, ovarian, uterine, or other related cancers.  Additionally, no uncertain changes were found.  Discussed that, while this does not totally rule out a genetic cause for the personal and family history of cancer (since we cannot be sure we are testing all the right genes at this point in time), that this is likely a reassuring result for Sheena Miles and that most likely her cancer just happened by chance.  Many of Sheena Miles's family members have not had cancer, including her parents, and most cancer happens by chance.  Discussed that this result does not recommend any changes to her future cancer screening; she should continue to follow the cancer screening guidelines provided by her oncology and primary care providers.  Women in the family are still considered to be at a somewhat increased risk for breast and ovarian cancer based on the family history.  Ms. Sheena Miles sisters should make their doctors aware of the family history, so that they can receive the most appropriate cancer screening in the future.  Encouraged Ms. Sheena Miles to call and update the family history in the future, so that we can consider any additional genetic testing options for her, if they are relevant at that time.  She is welcome to call or email me with any questions.  She would like a copy of her results emailed to her, so I will do that via secure email.

## 2015-11-30 NOTE — Patient Instructions (Signed)
Pegfilgrastim injection What is this medicine? PEGFILGRASTIM (PEG fil gra stim) is a long-acting granulocyte colony-stimulating factor that stimulates the growth of neutrophils, a type of white blood cell important in the body's fight against infection. It is used to reduce the incidence of fever and infection in patients with certain types of cancer who are receiving chemotherapy that affects the bone marrow, and to increase survival after being exposed to high doses of radiation. This medicine may be used for other purposes; ask your health care provider or pharmacist if you have questions. What should I tell my health care provider before I take this medicine? They need to know if you have any of these conditions: -kidney disease -latex allergy -ongoing radiation therapy -sickle cell disease -skin reactions to acrylic adhesives (On-Body Injector only) -an unusual or allergic reaction to pegfilgrastim, filgrastim, other medicines, foods, dyes, or preservatives -pregnant or trying to get pregnant -breast-feeding How should I use this medicine? This medicine is for injection under the skin. If you get this medicine at home, you will be taught how to prepare and give the pre-filled syringe or how to use the On-body Injector. Refer to the patient Instructions for Use for detailed instructions. Use exactly as directed. Take your medicine at regular intervals. Do not take your medicine more often than directed. It is important that you put your used needles and syringes in a special sharps container. Do not put them in a trash can. If you do not have a sharps container, call your pharmacist or healthcare provider to get one. Talk to your pediatrician regarding the use of this medicine in children. While this drug may be prescribed for selected conditions, precautions do apply. Overdosage: If you think you have taken too much of this medicine contact a poison control center or emergency room at  once. NOTE: This medicine is only for you. Do not share this medicine with others. What if I miss a dose? It is important not to miss your dose. Call your doctor or health care professional if you miss your dose. If you miss a dose due to an On-body Injector failure or leakage, a new dose should be administered as soon as possible using a single prefilled syringe for manual use. What may interact with this medicine? Interactions have not been studied. Give your health care provider a list of all the medicines, herbs, non-prescription drugs, or dietary supplements you use. Also tell them if you smoke, drink alcohol, or use illegal drugs. Some items may interact with your medicine. This list may not describe all possible interactions. Give your health care provider a list of all the medicines, herbs, non-prescription drugs, or dietary supplements you use. Also tell them if you smoke, drink alcohol, or use illegal drugs. Some items may interact with your medicine. What should I watch for while using this medicine? You may need blood work done while you are taking this medicine. If you are going to need a MRI, CT scan, or other procedure, tell your doctor that you are using this medicine (On-Body Injector only). What side effects may I notice from receiving this medicine? Side effects that you should report to your doctor or health care professional as soon as possible: -allergic reactions like skin rash, itching or hives, swelling of the face, lips, or tongue -dizziness -fever -pain, redness, or irritation at site where injected -pinpoint red spots on the skin -red or dark-brown urine -shortness of breath or breathing problems -stomach or side pain, or pain   at the shoulder -swelling -tiredness -trouble passing urine or change in the amount of urine Side effects that usually do not require medical attention (report to your doctor or health care professional if they continue or are  bothersome): -bone pain -muscle pain This list may not describe all possible side effects. Call your doctor for medical advice about side effects. You may report side effects to FDA at 1-800-FDA-1088. Where should I keep my medicine? Keep out of the reach of children. Store pre-filled syringes in a refrigerator between 2 and 8 degrees C (36 and 46 degrees F). Do not freeze. Keep in carton to protect from light. Throw away this medicine if it is left out of the refrigerator for more than 48 hours. Throw away any unused medicine after the expiration date. NOTE: This sheet is a summary. It may not cover all possible information. If you have questions about this medicine, talk to your doctor, pharmacist, or health care provider.    2016, Elsevier/Gold Standard. (2015-01-06 14:30:14)  

## 2015-11-30 NOTE — Progress Notes (Signed)
Follow Up Note: Gyn-Onc  Sheena Miles 29 y.o. female  CC:  Chief Complaint  Patient presents with  . Ovarian Cancer    F/U    HPI:  Sheena Miles is a 29 year old female, gravida 0, initially referred by Dr. [name] for a newly diagnosed ovarian mass.  She had irregular cycles for many years and went through menarche at the age of 63.  She was experiencing a lot of stress as she had a difficult relationship with her husband and she left him in June of this year. She began experiencing some pain in her shoulder, abdomen, leg and began having some irregular bowel movements which she felt related to stress.  She sought care at an Urgent Care for her moderate anxiety.  At that time, an exam was performed that revealed some tenderness in the right lower quadrant. She also had a low-grade temperature and was referred to the emergency room to rule out appendicitis or cholecystitis. She reports her abdomen was getting "hard" in early August with intermittent back pain. She also began experiencing pelvic pain over the past 2 weeks with intermittent nausea/vomiting that started in June. She's gained approximately 30 pounds in the last 5-6 months and is been having increasing fatigue.  In the emergency room, her CT scan revealed: She had a small right and large left sided pleural effusion with mild underlying atelectasis bilaterally. The gallbladder appeared normal. There was some ascites with a fluid collection in the area of the falciform ligament measuring 4 x 3 cm. There was another lentiform collection with a similar attenuation. Overall she had moderate volume ascites throughout the abdomen. The pancreas, spleen, adrenals, kidneys, stomach, bowel were unremarkable. There are large diaphragmatic lymph nodes anterior to the heart measuring up to 1.3 cm. There are numerous small mesenteric lymph nodes present. The uterus and ovaries were not discretely identified. There is a multi-cystic septated  complex mass arising out of the low pelvis and extending into the left abdomen. It measured 13 x 7 x 7 cm. A prominently had cystic component along the cranial edge along the caudal edge of the abnormality. There is infiltration and probable carcinomatosis of the omentum. There were numerous omental implants with the largest measuring 3.8 cm.   She was initially seen on 08/31/15.  Lab work resulted: CA 125 33145, LDH 663, Inhibin B <10, CEA <0.5, AFP 2.2. On 09/06/15, she underwent an exploratory laparotomy with supracervical hysterectomy, bilateral salpingoophorectomy, omentectomy, peritoneal stripping, resection of bladder nodule, rectosigmoid resection with mobilization of the splenic flexure and primary end-to-end anastamosis (R2 resection). Operative findings included: 1. 3L of bloody ascites on abdominal entry  2. 12cm omental mass adherent to the colon, pelvic mass, anterior abdominal wall - resected and frozen with serous cancer  3. Bilateral adnexal masses densely adherent to the uterus, sigmoid colon, and rectum - resected en bloc  4. Pelvic peritoneal nodularity  5. 3cm Bladder nodule - resected  6. Residual tumor along the entirely of the small bowel mesentery with multiple nodules up to 1.5cm in size, miliary disease of the diaphragm, and peritoneal nodularity along the pelvis and rectum (R2)  7. Normal appearing gallbladder, liver, spleen, and stomach  8. Negative bubble test following primary anastamosis of the recto-sigmoid with two donuts removed following anastamosis  Final pathology revealed: Final Diagnosis  FSA, A: Omentum, partial omentectomy  - Extensive metastatic serous carcinoma  B: Uterus, cervix, bilateral tubes and ovaries and colon, hysterectomy, bilateral salpingo-oophorectomy and partial colectomy  -  High grade serous carcinoma, see synoptic report below  C: Colon, anastomotic rings, excision  - Colonic mucosa and wall negative for malignancy   Port a cath [facility].       Interval History:  She is overall doing very well. She is completed 3 cycles of chemotherapy. Her most recent CA-125 from earlier this week was down to 244. She will he is tolerating chemotherapy very well. She is working part-time. She'll have some aching now through Friday she'll use a heating pad. She'll have some nausea for a few days but has no neuropathy or other symptoms. Her outlook is very good. She did undergo genetic testing results which are still pending.   Current Meds:  Outpatient Encounter Prescriptions as of 11/30/2015  Medication Sig  . dexamethasone (DECADRON) 4 MG tablet Take 5 tablets with food 12 hours and 6 hours prior to Taxol chemotherapy  . ferrous fumarate (HEMOCYTE - 106 MG FE) 325 (106 FE) MG TABS tablet Take 1 tablet daily on an empty stomach with OJ.  Marland Kitchen HYDROmorphone (DILAUDID) 2 MG tablet 1 tablet every 4-6 hours as needed for pain  . ibuprofen (ADVIL,MOTRIN) 600 MG tablet Take 600 mg by mouth as needed.   . lidocaine-prilocaine (EMLA) cream Apply 1 application topically as needed.  Marland Kitchen LORazepam (ATIVAN) 0.5 MG tablet Take 1 tablet (0.5 mg total) by mouth every 8 (eight) hours as needed for anxiety or sleep.  . Multiple Vitamins-Minerals (MULTI-VITAMIN GUMMIES) CHEW Chew by mouth daily.  Marland Kitchen omeprazole (PRILOSEC) 10 MG capsule Take 10 mg by mouth daily.   . prochlorperazine (COMPAZINE) 10 MG tablet Take 1 tablet (10 mg total) by mouth every 6 (six) hours as needed.  . triamcinolone (NASACORT ALLERGY 24HR) 55 MCG/ACT AERO nasal inhaler Place 2 sprays into the nose daily.  . hydrocortisone (ANUSOL-HC) 2.5 % rectal cream Place 1 application rectally 2 (two) times daily as needed for hemorrhoids or itching. (Patient not taking: Reported on 11/17/2015)  . simethicone (MYLICON) 80 MG chewable tablet Chew 80 mg by mouth as needed.   . [DISCONTINUED] amoxicillin (AMOXIL) 500 MG capsule   . [DISCONTINUED] amoxicillin (AMOXIL) 500 MG tablet  Take 1 tablet (500 mg total) by mouth every 8 (eight) hours.  . [DISCONTINUED] fexofenadine (ALLEGRA) 60 MG tablet Take 60 mg by mouth daily.   No facility-administered encounter medications on file as of 11/30/2015.    Allergy:  Allergies  Allergen Reactions  . Codeine Palpitations    Described by patient as "horrible panic attack feeling and shortness of breath"    Social Hx:   Social History   Social History  . Marital Status: Legally Separated    Spouse Name: N/A  . Number of Children: N/A  . Years of Education: N/A   Occupational History  . Not on file.   Social History Main Topics  . Smoking status: Never Smoker   . Smokeless tobacco: Never Used     Comment: some hookah in college  . Alcohol Use: Yes     Comment: 1-2 nights week socially  . Drug Use: No  . Sexual Activity: No   Other Topics Concern  . Not on file   Social History Narrative    Past Surgical Hx:  Past Surgical History  Procedure Laterality Date  . Wisdom tooth extraction  2013    Past Medical Hx:  Past Medical History  Diagnosis Date  . Allergy   . Asthma     exercise  induced    Family Hx:  Family History  Problem Relation Age of Onset  . Diabetes Father   . Congestive Heart Failure Father   . Atrial fibrillation Father   . Crohn's disease Maternal Grandmother   . Breast cancer Paternal Grandmother     dx. 33s  . Skin cancer Paternal Uncle     unspecified type; dx. 84 or younger  . Heart attack Paternal Grandfather     Vitals:  Pulse 80, temperature 98.6 F (37 C), temperature source Oral, resp. rate 18, height 5\' 5"  (1.651 m), weight 247 lb 9.6 oz (112.311 kg), SpO2 99 %.  Physical Exam:  General: Well developed, well nourished female in no acute distress. Alert and oriented x 3.   Abdomen: Abdomen soft, non-tender and obese. Active bowel sounds in all quadrants. Well-healed vertical midline incision.   Assessment/Plan:  29 year old s/p exploratory laparotomy with  supracervical hysterectomy, bilateral salpingoophorectomy, omentectomy, peritoneal stripping, resection of bladder nodule, rectosigmoid resection with mobilization of the splenic flexure and primary end-to-end anastamosis (R2 resection)  on 08/31/15 for high grade serous carcinoma of the ovary.  She is doing well post-operatively.  Dressing changes to the open incision twice daily.  First cycle of chemotherapy on 09/26/15.  Her CA-125 is coming down very nicely. After 3 cycles of chemotherapy is 244. We will await the results of her genetics testing to see if she might be a candidate for a PARP inhibitor for consolidation.  Sheena Miles A., MD 11/30/2015, 3:53 PM

## 2015-12-02 ENCOUNTER — Ambulatory Visit: Payer: Self-pay | Admitting: Genetic Counselor

## 2015-12-02 DIAGNOSIS — Z1379 Encounter for other screening for genetic and chromosomal anomalies: Secondary | ICD-10-CM | POA: Insufficient documentation

## 2015-12-02 NOTE — Progress Notes (Signed)
GENETIC TEST RESULT  HPI: Sheena Miles was previously seen in the [facility] due to a personal history of ovarian cancer, family history of breast cancer, and concerns regarding a hereditary predisposition to cancer. Please refer to our prior cancer genetics clinic note from October 27, 2015 for more information regarding Sheena Miles's medical, social and family histories, and our assessment and recommendations, at the time. Sheena Miles recent genetic test results were disclosed to her, as were recommendations warranted by these results. These results and recommendations are discussed in more detail below.  GENETIC TEST RESULTS: At the time of Sheena Miles's visit on 10/26/15, we recommended she pursue genetic testing.  We ultimately ordered genetic testing through [facility] in [place], [place].  Testing ordered was that of the 35-gene [facility] Breast and Gyn Cancer Panel (with add-on preliminary-evidence genes).  The 35-gene [facility] Breast and Gyn Cancers Panel (with preliminary-evidence genes) included sequencing and/or deletion/duplication analysis of the following genes: AKT1, ATM, BARD1, BRCA1, BRCA2, BRIP1, CDC73, CDH1, CHEK2, DICER1, EPCAM, FAM175A, FANCC, MLH1, MRE11A, MSH2, MSH6, MUTYH, NBN, NF1, PALB2, PIK3CA, PMS2, POLD1, PTEN, RAD50, RAD51C, RAD51D, RINT1, SDHB, SDHD, SMARCA4, STK11, TP53, and XRCC2.   Those results are now back, the report date for which is November 29, 2015.  Genetic testing was normal, and did not reveal a deleterious mutation in these genes.  Additionally, no variants of uncertain significance (VUSes) were found.  The test report will be scanned into EPIC and will be located under the Results Review tab in the Pathology>Molecular Pathology section.   We discussed with Sheena Miles that since the current genetic testing is not perfect, it is possible there may be a gene mutation in one of these genes that current testing cannot detect, but that chance  is small. We also discussed, that it is possible that another gene that has not yet been discovered, or that we have not yet tested, is responsible for the cancer diagnoses in the family, and it is, therefore, important to remain in touch with cancer genetics in the future so that we can continue to offer Sheena Miles the most up to date genetic testing.   CANCER SCREENING RECOMMENDATIONS: While we still do not have an explanation for why Sheena Miles was diagnosed with ovarian cancer at a young age, this result is reassuring and indicates that Sheena Miles likely does not have an increased risk for a future cancer due to a mutation in one of these genes. This normal test also suggests that Sheena Miles cancer was most likely not due to an inherited predisposition associated with one of these genes.  Additionally, many of Sheena Miles family members have lived to later ages in life and have not had cancer.  Most cancers happen by chance and this negative test suggests that her cancer falls into this category.  We, therefore, recommended she continue to follow the cancer management and screening guidelines provided by her oncology and primary healthcare providers.   RECOMMENDATIONS FOR FAMILY MEMBERS: Women in this family might be at some increased risk of developing cancer, over the general population risk, simply due to the family history of cancer. We recommended women in this family have a yearly mammogram beginning at age 18, or 66 years younger than the earliest onset of cancer, an annual clinical breast exam, and perform monthly breast self-exams. This includes Sheena Miles sisters, who would also be considered to be at a somewhat increased risk for ovarian cancer. Sheena Miles sisters  should make their primary care providers aware of the family history of breast and ovarian cancer, so that they may receive the most appropriate cancer screening in the future.  Women in this family should also have a gynecological  exam as recommended by their primary provider. All family members should have a colonoscopy by age 15.  FOLLOW-UP: Lastly, we discussed with Sheena Miles that cancer genetics is a rapidly advancing field and it is possible that new genetic tests will be appropriate for her and/or her family members in the future. We encouraged her to remain in contact with cancer genetics on an annual basis so we can update her personal and family histories and let her know of advances in cancer genetics that may benefit this family.   Our contact number was provided. Sheena Miles questions were answered to her satisfaction, and she knows she is welcome to call us at anytime with additional questions or concerns.   Sheena Luz, MS Genetic Counselor [email or web address] Phone: [phone]

## 2015-12-06 ENCOUNTER — Encounter (HOSPITAL_COMMUNITY): Payer: Self-pay

## 2015-12-07 ENCOUNTER — Other Ambulatory Visit: Payer: Self-pay

## 2015-12-07 ENCOUNTER — Other Ambulatory Visit: Payer: Self-pay | Admitting: Oncology

## 2015-12-07 DIAGNOSIS — C569 Malignant neoplasm of unspecified ovary: Secondary | ICD-10-CM

## 2015-12-08 ENCOUNTER — Other Ambulatory Visit (HOSPITAL_BASED_OUTPATIENT_CLINIC_OR_DEPARTMENT_OTHER): Payer: BC Managed Care – PPO

## 2015-12-08 ENCOUNTER — Encounter: Payer: Self-pay | Admitting: Oncology

## 2015-12-08 ENCOUNTER — Ambulatory Visit (HOSPITAL_BASED_OUTPATIENT_CLINIC_OR_DEPARTMENT_OTHER): Payer: BC Managed Care – PPO | Admitting: Oncology

## 2015-12-08 VITALS — BP 100/86 | HR 126 | Temp 98.1°F | Resp 18 | Ht 65.0 in | Wt 247.1 lb

## 2015-12-08 DIAGNOSIS — Z95828 Presence of other vascular implants and grafts: Secondary | ICD-10-CM

## 2015-12-08 DIAGNOSIS — C562 Malignant neoplasm of left ovary: Secondary | ICD-10-CM | POA: Diagnosis not present

## 2015-12-08 DIAGNOSIS — C561 Malignant neoplasm of right ovary: Secondary | ICD-10-CM | POA: Diagnosis not present

## 2015-12-08 DIAGNOSIS — C7989 Secondary malignant neoplasm of other specified sites: Secondary | ICD-10-CM

## 2015-12-08 DIAGNOSIS — D701 Agranulocytosis secondary to cancer chemotherapy: Secondary | ICD-10-CM

## 2015-12-08 DIAGNOSIS — T451X5A Adverse effect of antineoplastic and immunosuppressive drugs, initial encounter: Secondary | ICD-10-CM

## 2015-12-08 DIAGNOSIS — E8940 Asymptomatic postprocedural ovarian failure: Secondary | ICD-10-CM

## 2015-12-08 DIAGNOSIS — R110 Nausea: Secondary | ICD-10-CM

## 2015-12-08 DIAGNOSIS — J910 Malignant pleural effusion: Secondary | ICD-10-CM

## 2015-12-08 DIAGNOSIS — C569 Malignant neoplasm of unspecified ovary: Secondary | ICD-10-CM

## 2015-12-08 DIAGNOSIS — D6481 Anemia due to antineoplastic chemotherapy: Secondary | ICD-10-CM

## 2015-12-08 DIAGNOSIS — Z23 Encounter for immunization: Secondary | ICD-10-CM

## 2015-12-08 LAB — CBC WITH DIFFERENTIAL/PLATELET
BASO%: 0.6 % (ref 0.0–2.0)
Basophils Absolute: 0.1 10*3/uL (ref 0.0–0.1)
EOS%: 0.3 % (ref 0.0–7.0)
Eosinophils Absolute: 0 10*3/uL (ref 0.0–0.5)
HEMATOCRIT: 35 % (ref 34.8–46.6)
HEMOGLOBIN: 11.3 g/dL — AB (ref 11.6–15.9)
LYMPH#: 2.9 10*3/uL (ref 0.9–3.3)
LYMPH%: 18.5 % (ref 14.0–49.7)
MCH: 28.7 pg (ref 25.1–34.0)
MCHC: 32.4 g/dL (ref 31.5–36.0)
MCV: 88.6 fL (ref 79.5–101.0)
MONO#: 1.2 10*3/uL — AB (ref 0.1–0.9)
MONO%: 7.5 % (ref 0.0–14.0)
NEUT#: 11.7 10*3/uL — ABNORMAL HIGH (ref 1.5–6.5)
NEUT%: 73.1 % (ref 38.4–76.8)
PLATELETS: 285 10*3/uL (ref 145–400)
RBC: 3.95 10*6/uL (ref 3.70–5.45)
RDW: 21.6 % — ABNORMAL HIGH (ref 11.2–14.5)
WBC: 16 10*3/uL — ABNORMAL HIGH (ref 3.9–10.3)

## 2015-12-08 LAB — COMPREHENSIVE METABOLIC PANEL
ALBUMIN: 4.1 g/dL (ref 3.5–5.0)
ALK PHOS: 124 U/L (ref 40–150)
ALT: 22 U/L (ref 0–55)
ANION GAP: 16 meq/L — AB (ref 3–11)
AST: 20 U/L (ref 5–34)
BUN: 12.2 mg/dL (ref 7.0–26.0)
CALCIUM: 10.2 mg/dL (ref 8.4–10.4)
CO2: 19 mEq/L — ABNORMAL LOW (ref 22–29)
Chloride: 106 mEq/L (ref 98–109)
Creatinine: 0.8 mg/dL (ref 0.6–1.1)
Glucose: 81 mg/dl (ref 70–140)
POTASSIUM: 4 meq/L (ref 3.5–5.1)
Sodium: 141 mEq/L (ref 136–145)
Total Bilirubin: <0.3 mg/dL (ref 0.20–1.20)
Total Protein: 7.5 g/dL (ref 6.4–8.3)

## 2015-12-08 MED ORDER — LORAZEPAM 1 MG PO TABS: 1.0000 mg | ORAL_TABLET | Freq: Three times a day (TID) | ORAL | Status: DC

## 2015-12-08 MED ORDER — HYDROMORPHONE HCL 2 MG PO TABS: ORAL_TABLET | ORAL | Status: DC

## 2015-12-08 MED ORDER — ONDANSETRON HCL 8 MG PO TABS: 8.0000 mg | ORAL_TABLET | Freq: Three times a day (TID) | ORAL | Status: DC | PRN

## 2015-12-08 NOTE — Progress Notes (Signed)
OFFICE PROGRESS NOTE   December 08, 2015   Physicians:Paola Lorre Nick, MD (PCP [facility]) , [name]  INTERVAL HISTORY:  Patient is seen, alone for visit, in continuing attention to chemotherapy ongoing for IVA high grade serous carcinoma of bilateral ovaries. She had cycle 4 carbo taxol on 11-28-15 (listed in this EMR as cycle 3, however first cycle given at [facility]) with neulasta on 11-30-15 (On Pro not placed as ordered). She saw Dr Alycia Rossetti on 11-30-15, has healed well from surgery. Expect repeat scans and follow up with gyn oncology after completes 6 cycles of chemo.   URI present at nadir of counts cycle 3 resolved entirely. Patient tolerated most recent chemotherapy a little better than previously. Nausea was still significant for first 2 days after treatment, then better than previously, and smells not as bothersome. She had initial constipation, then diarrhea when constipation cleared, and now bowels moving with formed stool daily. She has been able to work same reduced schedule this week. She denies abdominal or pelvic pain, bleeding, increased SOB or other respiratory symptoms, any fever or other symptoms of infection. She sleeps quite a bit with antiemetics week after chemo, then has difficulty sleeping. No problems with PAC. Wound is healed. Bladder ok. No swelling LE. Minimal peripheral neuropathy unchanged and not bothersome. Hot flashes ongoing. No problems with PAC. Remainder of 10 point Review of Systems negative/ unchanged.   Power PAC placed at [facility] 09-09-15 Flu vaccine 10-27-15 Genetics testing 10-27-15 normal ([facility] Breast [facility] panel). She has not had [facility] tumor testing.  School winter break begins 02-20-15  ONCOLOGIC HISTORY Patient initially developed constipation and abdominal pain summer 2016 which she thought was stress related, then worsening abdominal and pelvic pain, some back pain and some fever; she had weight gain ~ 30 lbs in prior 5-6  months. She was seen at [facility] with concern for acute appendicitis. She was evaluated at local ED, I believe [facility], with CT reportedly showing large left and small right pleural effusions, moderate ascites, complex septated cystic mass in low pelvis into left abdomen 13 x 7 x 7 cm, adenopathy Including large diaphragmatic lymph nodes anterior to heart and numerous mesenteric nodes, and apparent carcinomatosis with implants in omentum up to 3.8 cm. She was seen by Dr [name] and referred to Dr Alycia Rossetti. At Dr Elenora Gamma exam 08-31-15 there was large mass in cul de sac and abdominal fluid wave, decreased BS left lower 1/2 of chest. Lab studies included CA 125 33,145; inhibin B <10; CEA <0.5, AFP 2.2, LDH 1281 and HCG negative. Surgery by Dr Alycia Rossetti at [facility] on 09-06-15 was exploratory laparotomy with supracervical hysterectomy, BSO, omentectomy, resection with primary reanastomosis of rectosigmoid and peritoneal stripping. Intraoperative findings included 3 liters of bloody ascites, 12 cm omental mass adherent to uterus, sigmoid colon and rectum which was resected en bloc, bladder nodule resected. At completion of surgery there was residual tumor along entirety of small bowel mesentery with multiple nodules up to 1.5 cm, miliary disease on diaphragm, and peritoneal nodularity along pelvis and rectum (R2 resection). Pathology [facility] [phone] high grade serous carcinoma of bilateral ovaries involving bilateral tubes, uterine wall anterior and posterior and lower uterine segment, omentum, colon. No nodes submitted.  [facility] discharge summary is not presently available, however she was transfused 2 units PRBCs on each 9-9 and 09-13-15 for hemoglobin as low as 6.8 - 7.1. She had urgent right thoracentesis for 750 cc on 09-08-15 with cytology positive  for high grade serous carcinoma ([id number]) and left thoracentesis for 400 cc on 09-09-15. CT angio chest on POD 1 was negative for PE. She tolerated  morphine by PCA post operatively. She had mild cellulitis at abdominal incision on day of DC, begun on Keflex then. Psychiatry saw in hospital, begun on zoloft. She was discharged home with 28 days of lovenox. She saw Dr Alycia Rossetti on 09-19-15 with staples removed, however wound opened that evening. She was seen by gyn onc provider on 09-23-15, with wound open 3 x 5 cm, 3 cm deep, no evidence of infection. Wet to dry dressings bid were begun, ongoing. She had follow up wound check on 09-27-15, no findings of concern. GOG protocol at [facility] had delay, so that treatment given off protocol with first cycle of carboplatin taxol at [facility] on 09-26-15, with carboplatin 900 mg total dose, taxol at 175 mg/m2 = 399 mg, decadron 12 mg, Aloxi 250 mg. EMEND 150 mg, benadryl 25 mg and pepcid 40 mg. She did not take oral decadron premedication for the taxol. She had severe, generalized aching beginning night of day 3 thru day 4   Objective:  Vital signs in last 24 hours:  BP 100/86 mmHg  Pulse 126  Temp(Src) 98.1 F (36.7 C) (Oral)  Resp 18  Ht 5' 5"  (1.651 m)  Wt 247 lb 1.6 oz (112.084 kg)  BMI 41.12 kg/m2  SpO2 95% Weight is stable. Alert, oriented and appropriate. Ambulatory without difficulty. Respirations not labored RA. Alopecia  HEENT:PERRL, sclerae not icteric. Oral mucosa moist without lesions, posterior pharynx clear.   No JVD.  Lymphatics:no cervical,supraclavicular, axillary or inguinal adenopathy Resp: clear to auscultation bilaterally and normal percussion bilaterally Cardio: regular rate and rhythm. No gallop. GI: abdomen soft, nontender, not distended, no mass or organomegaly. Some bowel sounds.  Musculoskeletal/ Extremities: without pitting edema, cords, tenderness Neuro: no increase peripheral neuropathy. Otherwise nonfocal. Psych appropriate mood and affect Skin without rash, ecchymosis, petechiae Portacath-without erythema or tenderness  Lab Results:  Results for orders placed or performed  in visit on 12/08/15  CBC with Differential  Result Value Ref Range   WBC 16.0 (H) 3.9 - 10.3 10e3/uL   NEUT# 11.7 (H) 1.5 - 6.5 10e3/uL   HGB 11.3 (L) 11.6 - 15.9 g/dL   HCT 35.0 34.8 - 46.6 %   Platelets 285 145 - 400 10e3/uL   MCV 88.6 79.5 - 101.0 fL   MCH 28.7 25.1 - 34.0 pg   MCHC 32.4 31.5 - 36.0 g/dL   RBC 3.95 3.70 - 5.45 10e6/uL   RDW 21.6 (H) 11.2 - 14.5 %   lymph# 2.9 0.9 - 3.3 10e3/uL   MONO# 1.2 (H) 0.1 - 0.9 10e3/uL   Eosinophils Absolute 0.0 0.0 - 0.5 10e3/uL   Basophils Absolute 0.1 0.0 - 0.1 10e3/uL   NEUT% 73.1 38.4 - 76.8 %   LYMPH% 18.5 14.0 - 49.7 %   MONO% 7.5 0.0 - 14.0 %   EOS% 0.3 0.0 - 7.0 %   BASO% 0.6 0.0 - 2.0 %  Comprehensive metabolic panel  Result Value Ref Range   Sodium 141 136 - 145 mEq/L   Potassium 4.0 3.5 - 5.1 mEq/L   Chloride 106 98 - 109 mEq/L   CO2 19 (L) 22 - 29 mEq/L   Glucose 81 70 - 140 mg/dl   BUN 12.2 7.0 - 26.0 mg/dL   Creatinine 0.8 0.6 - 1.1 mg/dL   Total Bilirubin <0.30 0.20 - 1.20 mg/dL   Alkaline  Phosphatase 124 40 - 150 U/L   AST 20 5 - 34 U/L   ALT 22 0 - 55 U/L   Total Protein 7.5 6.4 - 8.3 g/dL   Albumin 4.1 3.5 - 5.0 g/dL   Calcium 10.2 8.4 - 10.4 mg/dL   Anion Gap 16 (H) 3 - 11 mEq/L   EGFR >90 >90 ml/min/1.73 m2   CA 125 on 11-28-15 down to 244, this having been 662 on 11-07-15 and 8521 on 10-10-15.  Studies/Results:  No results found.  Medications: I have reviewed the patient's current medications. Should use On Pro Neulasta with subsequent chemo cycles Carbo dosing is AUC = 6 using actual weight, for carbo dose of 900 mg as was used cycle 1 at [facility].  DISCUSSION : patient is tolerating treatment adequately with all of present support and is in agreement with continuing as planned.   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 Malignant pleural fluid and ascites at presentation.  Clinically responding to treatment and additionally healing from surgery, with progressive improvement in CA 125 tho this is not yet normalized. Will treat with cycle 5 on 12-19-15 as long as ANC >=1.5 and plt >=100k, with neulasta as On Pro. I will see her with labs 12-29 and she will be due cycle 6 on 01-09-16 with same parameters. NOTE she is being treated with carbo at AUC =6 using actual weight. Genetics testing negative; has not had somatic testing of tumor. I will let Dr Alycia Rossetti know. All path outside of [facility] system. 2.surgical wound now healed following initial dehiscence. 3.surgical menopause with ongoing hot flashes. 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 8.hemorrhoid discomfort improved. Note rectosigmoid resection with reanastomosis at debulking surgery. 9.mild chemo anemia, iron studies ok 09-30-15. On supplemental ferrous fumarate.Follow counts. 10.chemo induced neutropenia documented with previous cycle so using neulasta with each chemotherapy treatment now   all questions answered. Chemo and neulasta orders confirmed. Patient knows to call if any concerns prior to next scheduled appointments. Time spent 30 min including >50% counseling and coordination of care. CC Drs Alycia Rossetti , [facility]   Gordy Levan, MD   12/08/2015, 2:56 PM

## 2015-12-19 ENCOUNTER — Other Ambulatory Visit (HOSPITAL_BASED_OUTPATIENT_CLINIC_OR_DEPARTMENT_OTHER): Payer: BC Managed Care – PPO

## 2015-12-19 ENCOUNTER — Ambulatory Visit (HOSPITAL_BASED_OUTPATIENT_CLINIC_OR_DEPARTMENT_OTHER): Payer: BC Managed Care – PPO

## 2015-12-19 VITALS — BP 115/69 | HR 92 | Temp 98.4°F | Resp 18

## 2015-12-19 DIAGNOSIS — C569 Malignant neoplasm of unspecified ovary: Secondary | ICD-10-CM

## 2015-12-19 DIAGNOSIS — Z5111 Encounter for antineoplastic chemotherapy: Secondary | ICD-10-CM

## 2015-12-19 DIAGNOSIS — C561 Malignant neoplasm of right ovary: Secondary | ICD-10-CM

## 2015-12-19 DIAGNOSIS — C562 Malignant neoplasm of left ovary: Secondary | ICD-10-CM

## 2015-12-19 DIAGNOSIS — Z5189 Encounter for other specified aftercare: Secondary | ICD-10-CM

## 2015-12-19 LAB — CBC WITH DIFFERENTIAL/PLATELET
BASO%: 0.3 % (ref 0.0–2.0)
BASOS ABS: 0 10*3/uL (ref 0.0–0.1)
EOS%: 0.2 % (ref 0.0–7.0)
Eosinophils Absolute: 0 10*3/uL (ref 0.0–0.5)
HEMATOCRIT: 36 % (ref 34.8–46.6)
HEMOGLOBIN: 11.8 g/dL (ref 11.6–15.9)
LYMPH#: 0.7 10*3/uL — AB (ref 0.9–3.3)
LYMPH%: 7 % — ABNORMAL LOW (ref 14.0–49.7)
MCH: 29.2 pg (ref 25.1–34.0)
MCHC: 32.8 g/dL (ref 31.5–36.0)
MCV: 89.1 fL (ref 79.5–101.0)
MONO#: 0.1 10*3/uL (ref 0.1–0.9)
MONO%: 1.1 % (ref 0.0–14.0)
NEUT#: 8.7 10*3/uL — ABNORMAL HIGH (ref 1.5–6.5)
NEUT%: 91.4 % — ABNORMAL HIGH (ref 38.4–76.8)
Platelets: 338 10*3/uL (ref 145–400)
RBC: 4.04 10*6/uL (ref 3.70–5.45)
RDW: 20.2 % — AB (ref 11.2–14.5)
WBC: 9.6 10*3/uL (ref 3.9–10.3)

## 2015-12-19 LAB — COMPREHENSIVE METABOLIC PANEL WITH GFR
ALT: 16 U/L (ref 0–55)
AST: 15 U/L (ref 5–34)
Albumin: 3.9 g/dL (ref 3.5–5.0)
Alkaline Phosphatase: 93 U/L (ref 40–150)
Anion Gap: 10 meq/L (ref 3–11)
BUN: 11.5 mg/dL (ref 7.0–26.0)
CO2: 24 meq/L (ref 22–29)
Calcium: 9.9 mg/dL (ref 8.4–10.4)
Chloride: 105 meq/L (ref 98–109)
Creatinine: 0.7 mg/dL (ref 0.6–1.1)
EGFR: >90 ml/min/1.73 m2
Glucose: 94 mg/dL (ref 70–140)
Potassium: 4.2 meq/L (ref 3.5–5.1)
Sodium: 138 meq/L (ref 136–145)
Total Bilirubin: <0.3 mg/dL (ref 0.20–1.20)
Total Protein: 7.3 g/dL (ref 6.4–8.3)

## 2015-12-19 MED ORDER — SODIUM CHLORIDE 0.9 % IV SOLN
Freq: Once | INTRAVENOUS | Status: AC
  Administered 2015-12-19: 12:00:00 via INTRAVENOUS

## 2015-12-19 MED ORDER — DIPHENHYDRAMINE HCL 50 MG/ML IJ SOLN
50.0000 mg | Freq: Once | INTRAMUSCULAR | Status: AC
  Administered 2015-12-19: 50 mg via INTRAVENOUS

## 2015-12-19 MED ORDER — PEGFILGRASTIM 6 MG/0.6ML ~~LOC~~ PSKT
6.0000 mg | PREFILLED_SYRINGE | Freq: Once | SUBCUTANEOUS | Status: AC
  Administered 2015-12-19: 6 mg via SUBCUTANEOUS
  Filled 2015-12-19: qty 0.6

## 2015-12-19 MED ORDER — PALONOSETRON HCL INJECTION 0.25 MG/5ML
INTRAVENOUS | Status: AC
Start: 2015-12-19 — End: 2015-12-19
  Filled 2015-12-19: qty 5

## 2015-12-19 MED ORDER — DIPHENHYDRAMINE HCL 50 MG/ML IJ SOLN
INTRAMUSCULAR | Status: AC
  Filled 2015-12-19: qty 1

## 2015-12-19 MED ORDER — FAMOTIDINE IN NACL 20-0.9 MG/50ML-% IV SOLN
INTRAVENOUS | Status: AC
Start: 2015-12-19 — End: 2015-12-19
  Filled 2015-12-19: qty 50

## 2015-12-19 MED ORDER — FAMOTIDINE IN NACL 20-0.9 MG/50ML-% IV SOLN
20.0000 mg | Freq: Once | INTRAVENOUS | Status: AC
  Administered 2015-12-19: 20 mg via INTRAVENOUS

## 2015-12-19 MED ORDER — SODIUM CHLORIDE 0.9 % IV SOLN
900.0000 mg | Freq: Once | INTRAVENOUS | Status: AC
  Administered 2015-12-19: 900 mg via INTRAVENOUS
  Filled 2015-12-19: qty 90

## 2015-12-19 MED ORDER — SODIUM CHLORIDE 0.9 % IJ SOLN
10.0000 mL | INTRAMUSCULAR | Status: DC | PRN
  Administered 2015-12-19: 10 mL
  Filled 2015-12-19: qty 10

## 2015-12-19 MED ORDER — SODIUM CHLORIDE 0.9 % IV SOLN
Freq: Once | INTRAVENOUS | Status: AC
  Administered 2015-12-19: 12:00:00 via INTRAVENOUS
  Filled 2015-12-19: qty 5

## 2015-12-19 MED ORDER — PACLITAXEL CHEMO INJECTION 300 MG/50ML
175.0000 mg/m2 | Freq: Once | INTRAVENOUS | Status: AC
  Administered 2015-12-19: 384 mg via INTRAVENOUS
  Filled 2015-12-19: qty 64

## 2015-12-19 MED ORDER — PALONOSETRON HCL INJECTION 0.25 MG/5ML
0.2500 mg | Freq: Once | INTRAVENOUS | Status: AC
  Administered 2015-12-19: 0.25 mg via INTRAVENOUS

## 2015-12-19 MED ORDER — HEPARIN SOD (PORK) LOCK FLUSH 100 UNIT/ML IV SOLN
500.0000 [IU] | Freq: Once | INTRAVENOUS | Status: AC | PRN
  Administered 2015-12-19: 500 [IU]
  Filled 2015-12-19: qty 5

## 2015-12-19 NOTE — Patient Instructions (Signed)
[facility] Discharge Instructions for Patients Receiving Chemotherapy  Today you received the following chemotherapy agents Taxol/Carboplatin  To help prevent nausea and vomiting after your treatment, we encourage you to take your nausea medication    If you develop nausea and vomiting that is not controlled by your nausea medication, call the clinic.   BELOW ARE SYMPTOMS THAT SHOULD BE REPORTED IMMEDIATELY:  *FEVER GREATER THAN 100.5 F  *CHILLS WITH OR WITHOUT FEVER  NAUSEA AND VOMITING THAT IS NOT CONTROLLED WITH YOUR NAUSEA MEDICATION  *UNUSUAL SHORTNESS OF BREATH  *UNUSUAL BRUISING OR BLEEDING  TENDERNESS IN MOUTH AND THROAT WITH OR WITHOUT PRESENCE OF ULCERS  *URINARY PROBLEMS  *BOWEL PROBLEMS  UNUSUAL RASH Items with * indicate a potential emergency and should be followed up as soon as possible.  Feel free to call the clinic you have any questions or concerns. The clinic phone number is [phone].  Please show the CHEMO ALERT CARD at check-in to the Emergency Department and triage nurse.   

## 2015-12-20 ENCOUNTER — Telehealth: Payer: Self-pay

## 2015-12-20 LAB — CA 125: CA 125: 144 U/mL — ABNORMAL HIGH (ref <35)

## 2015-12-20 NOTE — Telephone Encounter (Signed)
Pt lvm requesting CA 125,  Returned call and LVM with results

## 2015-12-22 ENCOUNTER — Telehealth: Payer: Self-pay | Admitting: *Deleted

## 2015-12-22 NOTE — Telephone Encounter (Signed)
-----   Message from Baruch Merl, RN sent at 12/22/2015  4:51 PM EST -----   ----- Message -----    From: Gordy Levan, MD    Sent: 12/20/2015   8:32 PM      To: Baruch Merl, RN  Labs seen and need follow up: please let her know marker result.

## 2015-12-22 NOTE — Telephone Encounter (Signed)
Called pt to  inform her of lab results concerning CA 125. No answer but left a VM to phone to let her know I will call her back with results.

## 2015-12-23 NOTE — Telephone Encounter (Signed)
Called pt this morning and gave her the results of labs -CA 125. Pt was glad to hear her numbers are trending down. I also reminded her of appts on 12/29/15. No further concerns.

## 2015-12-28 ENCOUNTER — Other Ambulatory Visit: Payer: Self-pay | Admitting: Oncology

## 2015-12-28 ENCOUNTER — Other Ambulatory Visit: Payer: Self-pay

## 2015-12-28 DIAGNOSIS — C569 Malignant neoplasm of unspecified ovary: Secondary | ICD-10-CM

## 2015-12-29 ENCOUNTER — Ambulatory Visit (HOSPITAL_BASED_OUTPATIENT_CLINIC_OR_DEPARTMENT_OTHER): Payer: BC Managed Care – PPO | Admitting: Oncology

## 2015-12-29 ENCOUNTER — Other Ambulatory Visit (HOSPITAL_BASED_OUTPATIENT_CLINIC_OR_DEPARTMENT_OTHER): Payer: BC Managed Care – PPO

## 2015-12-29 ENCOUNTER — Encounter: Payer: Self-pay | Admitting: Oncology

## 2015-12-29 ENCOUNTER — Telehealth: Payer: Self-pay | Admitting: Oncology

## 2015-12-29 VITALS — BP 122/61 | HR 93 | Temp 98.3°F | Resp 18 | Ht 65.0 in | Wt 250.4 lb

## 2015-12-29 DIAGNOSIS — Z95828 Presence of other vascular implants and grafts: Secondary | ICD-10-CM

## 2015-12-29 DIAGNOSIS — T451X5A Adverse effect of antineoplastic and immunosuppressive drugs, initial encounter: Secondary | ICD-10-CM

## 2015-12-29 DIAGNOSIS — C7989 Secondary malignant neoplasm of other specified sites: Secondary | ICD-10-CM | POA: Diagnosis not present

## 2015-12-29 DIAGNOSIS — D6481 Anemia due to antineoplastic chemotherapy: Secondary | ICD-10-CM | POA: Diagnosis not present

## 2015-12-29 DIAGNOSIS — C561 Malignant neoplasm of right ovary: Secondary | ICD-10-CM

## 2015-12-29 DIAGNOSIS — C569 Malignant neoplasm of unspecified ovary: Secondary | ICD-10-CM

## 2015-12-29 DIAGNOSIS — C562 Malignant neoplasm of left ovary: Secondary | ICD-10-CM | POA: Diagnosis not present

## 2015-12-29 DIAGNOSIS — R112 Nausea with vomiting, unspecified: Secondary | ICD-10-CM

## 2015-12-29 DIAGNOSIS — D701 Agranulocytosis secondary to cancer chemotherapy: Secondary | ICD-10-CM

## 2015-12-29 LAB — CBC WITH DIFFERENTIAL/PLATELET
BASO%: 0.2 % (ref 0.0–2.0)
Basophils Absolute: 0 10*3/uL (ref 0.0–0.1)
EOS%: 0.8 % (ref 0.0–7.0)
Eosinophils Absolute: 0.1 10*3/uL (ref 0.0–0.5)
HEMATOCRIT: 36.6 % (ref 34.8–46.6)
HEMOGLOBIN: 12.2 g/dL (ref 11.6–15.9)
LYMPH%: 20.4 % (ref 14.0–49.7)
MCH: 30.3 pg (ref 25.1–34.0)
MCHC: 33.3 g/dL (ref 31.5–36.0)
MCV: 91 fL (ref 79.5–101.0)
MONO#: 1.2 10*3/uL — ABNORMAL HIGH (ref 0.1–0.9)
MONO%: 10.1 % (ref 0.0–14.0)
NEUT#: 8.1 10*3/uL — ABNORMAL HIGH (ref 1.5–6.5)
NEUT%: 68.5 % (ref 38.4–76.8)
Platelets: 227 10*3/uL (ref 145–400)
RBC: 4.02 10*6/uL (ref 3.70–5.45)
RDW: 17.1 % — ABNORMAL HIGH (ref 11.2–14.5)
WBC: 11.8 10*3/uL — AB (ref 3.9–10.3)
lymph#: 2.4 10*3/uL (ref 0.9–3.3)

## 2015-12-29 LAB — COMPREHENSIVE METABOLIC PANEL
ALBUMIN: 3.9 g/dL (ref 3.5–5.0)
ALK PHOS: 128 U/L (ref 40–150)
ALT: 22 U/L (ref 0–55)
AST: 15 U/L (ref 5–34)
Anion Gap: 10 mEq/L (ref 3–11)
BUN: 9.7 mg/dL (ref 7.0–26.0)
CALCIUM: 9.9 mg/dL (ref 8.4–10.4)
CO2: 27 mEq/L (ref 22–29)
Chloride: 105 mEq/L (ref 98–109)
Creatinine: 0.7 mg/dL (ref 0.6–1.1)
Glucose: 90 mg/dl (ref 70–140)
POTASSIUM: 3.8 meq/L (ref 3.5–5.1)
Sodium: 142 mEq/L (ref 136–145)
Total Bilirubin: <0.3 mg/dL (ref 0.20–1.20)
Total Protein: 7.3 g/dL (ref 6.4–8.3)

## 2015-12-29 NOTE — Progress Notes (Signed)
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 She had cycle 5 carbo taxol on 12-19-15 (cycle 1 at [facility] so numbering not correct in this EMR synopsis) with on pro neulasta. Plan is for CT CAP after cycle 6, shortly prior to seeing Dr Alycia Rossetti on 01-24-15.  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 Fatigue lasted longer with most recent treatment, as did nausea, tho this was manageable. She was constipated for a few days after treatment, not using anything for bowels and I have suggested adding miralax after next treatment, as constipation likely not helping nausea. She has no peripheral neuropathy. Hot flashes are most bothersome at hs. She denies abdominal or pelvic pain, any bleeding, LE swelling, SOB or other respiratory symptoms, fever or symptoms of infection. The on pro neulasta injector worked well. PAC is slightly tilted but access still easily accomplished. Remainder of 10 point Review of Systems negative.    Power PAC placed at [facility] 09-09-15 Flu vaccine 10-27-15 Genetics testing 10-27-15 normal ([facility] panel). She has not had Foundation One tumor testing.  ONCOLOGIC HISTORY   Ovarian ca ([place])   08/31/2015 Initial Diagnosis Ovarian ca ([place])   09/16/2015 Surgery    09/26/2015 -  Chemotherapy paclitaxel and carboplatin  Patient initially developed constipation and abdominal pain summer 2016 which she thought was stress related, then worsening abdominal and pelvic pain, some back pain and some fever; she had weight gain ~ 30 lbs in prior 5-6 months. She was seen at [facility] with concern for acute appendicitis. She was evaluated at local ED, I believe [facility], with  CT reportedly showing large left and small right pleural effusions, moderate ascites, complex septated cystic mass in low pelvis into left abdomen 13 x 7 x 7 cm, adenopathy Including large diaphragmatic lymph nodes anterior to heart and numerous mesenteric nodes, and apparent carcinomatosis with implants in omentum up to 3.8 cm. She was seen by Dr [name] and referred to Dr Alycia Rossetti. At Dr Elenora Gamma exam 08-31-15 there was large mass in cul de sac and abdominal fluid wave, decreased BS left lower 1/2 of chest. Lab studies included CA 125 33,145; inhibin B <10; CEA <0.5, AFP 2.2, LDH 1281 and HCG negative. Surgery by Dr Alycia Rossetti at [facility] on 09-06-15 was exploratory laparotomy with supracervical hysterectomy, BSO, omentectomy, resection with primary reanastomosis of rectosigmoid and peritoneal stripping. Intraoperative findings included 3 liters of bloody ascites, 12 cm omental mass adherent to uterus, sigmoid colon and rectum which was resected en bloc, bladder nodule resected. At completion of surgery there was residual tumor along entirety of small bowel mesentery with multiple nodules up to 1.5 cm, miliary disease on diaphragm, and peritoneal nodularity along pelvis and rectum (R2 resection). Pathology [facility] [phone] high grade serous carcinoma of bilateral ovaries involving bilateral tubes, uterine wall anterior and posterior and lower uterine segment, omentum, colon. No nodes submitted.  [facility] discharge summary is not presently available, however she was transfused 2 units PRBCs on each 9-9 and 09-13-15 for hemoglobin as low as 6.8 - 7.1. She had urgent right thoracentesis for 750 cc on 09-08-15 with cytology positive for high grade serous carcinoma ([id number]) and left thoracentesis for 400 cc on 09-09-15. CT angio chest on  POD 1 was negative for PE. She tolerated morphine by PCA post operatively. She had mild cellulitis at abdominal incision on day of DC, begun on Keflex then. Psychiatry saw in hospital, begun on  zoloft. She was discharged home with 28 days of lovenox. She saw Dr Alycia Rossetti on 09-19-15 with staples removed, however wound opened that evening. She was seen by gyn onc provider on 09-23-15, with wound open 3 x 5 cm, 3 cm deep, no evidence of infection. Wet to dry dressings bid were begun, ongoing. She had follow up wound check on 09-27-15, no findings of concern. GOG protocol at [facility] had delay, so that treatment given off protocol with first cycle of carboplatin taxol at [facility] on 09-26-15, with carboplatin 900 mg total dose, taxol at 175 mg/m2 = 399 mg, decadron 12 mg, Aloxi 250 mg. EMEND 150 mg, benadryl 25 mg and pepcid 40 mg. She did not take oral decadron premedication for the taxol. She had severe, generalized aching beginning night of day 3 thru day 4   Objective:  Vital signs in last 24 hours:  BP 122/61 mmHg  Pulse 93  Temp(Src) 98.3 F (36.8 C) (Oral)  Resp 18  Ht 5' 5"  (1.651 m)  Wt 250 lb 6.4 oz (113.581 kg)  BMI 41.67 kg/m2  SpO2 99% Weight up 3 lbs Alert, oriented and appropriate. Ambulatory without difficulty, able to change positions easily on exam table. Complete alopecia  HEENT:PERRL, sclerae not icteric. Oral mucosa moist without lesions, posterior pharynx clear.  Neck supple. No JVD.  Lymphatics:no cervical,supraclavicular or inguinal adenopathy Resp: clear to auscultation bilaterally and normal percussion bilaterally Cardio: regular rate and rhythm. No gallop. GI: abdomen obese, soft, nontender, not distended, no appreciable mass or organomegaly. Normally active bowel sounds. Surgical incision appears well healed. Musculoskeletal/ Extremities: without pitting edema, cords, tenderness Neuro: no peripheral neuropathy. Otherwise nonfocal Skin without rash, ecchymosis, petechiae Portacath-without erythema or tenderness  Lab Results:  Results for orders placed or performed in visit on 12/29/15  CBC with Differential  Result Value Ref Range   WBC 11.8 (H) 3.9 - 10.3  10e3/uL   NEUT# 8.1 (H) 1.5 - 6.5 10e3/uL   HGB 12.2 11.6 - 15.9 g/dL   HCT 36.6 34.8 - 46.6 %   Platelets 227 145 - 400 10e3/uL   MCV 91.0 79.5 - 101.0 fL   MCH 30.3 25.1 - 34.0 pg   MCHC 33.3 31.5 - 36.0 g/dL   RBC 4.02 3.70 - 5.45 10e6/uL   RDW 17.1 (H) 11.2 - 14.5 %   lymph# 2.4 0.9 - 3.3 10e3/uL   MONO# 1.2 (H) 0.1 - 0.9 10e3/uL   Eosinophils Absolute 0.1 0.0 - 0.5 10e3/uL   Basophils Absolute 0.0 0.0 - 0.1 10e3/uL   NEUT% 68.5 38.4 - 76.8 %   LYMPH% 20.4 14.0 - 49.7 %   MONO% 10.1 0.0 - 14.0 %   EOS% 0.8 0.0 - 7.0 %   BASO% 0.2 0.0 - 2.0 %  Comprehensive metabolic panel  Result Value Ref Range   Sodium 142 136 - 145 mEq/L   Potassium 3.8 3.5 - 5.1 mEq/L   Chloride 105 98 - 109 mEq/L   CO2 27 22 - 29 mEq/L   Glucose 90 70 - 140 mg/dl   BUN 9.7 7.0 - 26.0 mg/dL   Creatinine 0.7 0.6 - 1.1 mg/dL   Total Bilirubin <0.30 0.20 - 1.20 mg/dL   Alkaline Phosphatase 128 40 - 150 U/L   AST 15 5 - 34  U/L   ALT 22 0 - 55 U/L   Total Protein 7.3 6.4 - 8.3 g/dL   Albumin 3.9 3.5 - 5.0 g/dL   Calcium 9.9 8.4 - 10.4 mg/dL   Anion Gap 10 3 - 11 mEq/L   EGFR >90 >90 ml/min/1.73 m2    CA 125 on 12-19-15 was 144, this having been 244 on 11-28. Will recheck day of #6 chemo   Studies/Results:  No results found.  Medications: I have reviewed the patient's current medications. Add miralax around next chemotherapy treatment.  DISCUSSION Due to transportation/ work schedules for family members around [facility] day, will delay cycle 6 x 1 week to 01-15-15, again with on pro neulasta support.  Discussed oral contrast for CT, as intial CT at [facility] was done without oral contrast due to symptoms of acute abdomen, and CT just post op at [facility] also without oral contrast. She will likely do best with water based contrast at radiology, as upcoming scan will be done shortly after cycle 6 treatment.  Discussed possibility that she may need additional chemotherapy, including possibly additional  cycles of same carbo taxol. She understands and is glad to do additional treatment if that is recommended. I have told her that [facility] pathology is doing additional testing on surgical path (Foundation One) at Dr Elenora Gamma request. I have not seen results from that as yet.  Note carbo dosing is AUC = 6 using actual weight, as was used for cycle 1 at [facility]. Renal function has been fine with this dosing.    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 Malignant pleural fluid and ascites at presentation. Improvement in CA 125 tho this has not normalized. Will treat with cycle 6 on 01-16-16 as long as ANC >=1.5 and plt >=100k, with neulasta as On Pro (cycle 6 delayed x 1 week due to transportation at patient's request).  NOTE she is being treated with carbo at AUC =6 using actual weight. For CT CAP 01-20-16 prior to seeing Dr Alycia Rossetti in [place] on 01-24-15. Genetics testing negative; Foundation One sent from [facility] on surgical path. 2.surgical wound healed following initial dehiscence. 3.surgical menopause with ongoing hot flashes. 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 8.hemorrhoid discomfort improved. Note rectosigmoid resection with reanastomosis at debulking surgery. 9.mild chemo anemia, iron studies ok 09-30-15. Improved on supplemental ferrous fumarate, continue 10.chemo induced neutropenia documented with previous cycle, using neulasta with each chemotherapy treatment. ANC good today 11.obesity: BMI 41   All questions answered. Chemo and neulasta orders moved and confirmed. Time spent 25 min including >50% counseling and coordination of care. CC Dr Tobie Poet and Dr Cammie Mcgee, MD   12/29/2015, 4:57 PM

## 2015-12-29 NOTE — Telephone Encounter (Signed)
Appointments made and avs printed for patient °

## 2016-01-16 ENCOUNTER — Other Ambulatory Visit (HOSPITAL_BASED_OUTPATIENT_CLINIC_OR_DEPARTMENT_OTHER): Payer: BC Managed Care – PPO

## 2016-01-16 ENCOUNTER — Ambulatory Visit (HOSPITAL_BASED_OUTPATIENT_CLINIC_OR_DEPARTMENT_OTHER): Payer: BC Managed Care – PPO

## 2016-01-16 VITALS — BP 121/86 | HR 96 | Temp 98.1°F | Resp 16

## 2016-01-16 DIAGNOSIS — C561 Malignant neoplasm of right ovary: Secondary | ICD-10-CM | POA: Diagnosis not present

## 2016-01-16 DIAGNOSIS — C569 Malignant neoplasm of unspecified ovary: Secondary | ICD-10-CM

## 2016-01-16 DIAGNOSIS — Z5111 Encounter for antineoplastic chemotherapy: Secondary | ICD-10-CM | POA: Diagnosis not present

## 2016-01-16 DIAGNOSIS — Z5189 Encounter for other specified aftercare: Secondary | ICD-10-CM | POA: Diagnosis not present

## 2016-01-16 LAB — CBC WITH DIFFERENTIAL/PLATELET
BASO%: 0.1 % (ref 0.0–2.0)
Basophils Absolute: 0 10*3/uL (ref 0.0–0.1)
EOS%: 0 % (ref 0.0–7.0)
Eosinophils Absolute: 0 10*3/uL (ref 0.0–0.5)
HCT: 37.4 % (ref 34.8–46.6)
HEMOGLOBIN: 12.6 g/dL (ref 11.6–15.9)
LYMPH%: 7.6 % — AB (ref 14.0–49.7)
MCH: 30.9 pg (ref 25.1–34.0)
MCHC: 33.8 g/dL (ref 31.5–36.0)
MCV: 91.2 fL (ref 79.5–101.0)
MONO#: 0 10*3/uL — ABNORMAL LOW (ref 0.1–0.9)
MONO%: 0.4 % (ref 0.0–14.0)
NEUT%: 91.9 % — ABNORMAL HIGH (ref 38.4–76.8)
NEUTROS ABS: 5.7 10*3/uL (ref 1.5–6.5)
PLATELETS: 360 10*3/uL (ref 145–400)
RBC: 4.1 10*6/uL (ref 3.70–5.45)
RDW: 16.6 % — ABNORMAL HIGH (ref 11.2–14.5)
WBC: 6.2 10*3/uL (ref 3.9–10.3)
lymph#: 0.5 10*3/uL — ABNORMAL LOW (ref 0.9–3.3)

## 2016-01-16 LAB — COMPREHENSIVE METABOLIC PANEL
ALBUMIN: 4.1 g/dL (ref 3.5–5.0)
ALK PHOS: 85 U/L (ref 40–150)
ALT: 26 U/L (ref 0–55)
ANION GAP: 12 meq/L — AB (ref 3–11)
AST: 21 U/L (ref 5–34)
BUN: 11.1 mg/dL (ref 7.0–26.0)
CO2: 20 meq/L — AB (ref 22–29)
CREATININE: 0.7 mg/dL (ref 0.6–1.1)
Calcium: 10.3 mg/dL (ref 8.4–10.4)
Chloride: 107 mEq/L (ref 98–109)
EGFR: >90 mL/min/{1.73_m2} (ref >90)
GLUCOSE: 138 mg/dL (ref 70–140)
Potassium: 4.2 mEq/L (ref 3.5–5.1)
SODIUM: 139 meq/L (ref 136–145)
TOTAL PROTEIN: 7.5 g/dL (ref 6.4–8.3)

## 2016-01-16 MED ORDER — PALONOSETRON HCL INJECTION 0.25 MG/5ML
INTRAVENOUS | Status: AC
  Filled 2016-01-16: qty 5

## 2016-01-16 MED ORDER — DIPHENHYDRAMINE HCL 50 MG/ML IJ SOLN
INTRAMUSCULAR | Status: AC
  Filled 2016-01-16: qty 1

## 2016-01-16 MED ORDER — DIPHENHYDRAMINE HCL 50 MG/ML IJ SOLN
50.0000 mg | Freq: Once | INTRAMUSCULAR | Status: AC
  Administered 2016-01-16: 50 mg via INTRAVENOUS

## 2016-01-16 MED ORDER — FOSAPREPITANT DIMEGLUMINE INJECTION 150 MG
Freq: Once | INTRAVENOUS | Status: AC
  Administered 2016-01-16: 09:00:00 via INTRAVENOUS
  Filled 2016-01-16: qty 5

## 2016-01-16 MED ORDER — HEPARIN SOD (PORK) LOCK FLUSH 100 UNIT/ML IV SOLN
500.0000 [IU] | Freq: Once | INTRAVENOUS | Status: AC | PRN
  Administered 2016-01-16: 500 [IU]
  Filled 2016-01-16: qty 5

## 2016-01-16 MED ORDER — PACLITAXEL CHEMO INJECTION 300 MG/50ML
175.0000 mg/m2 | Freq: Once | INTRAVENOUS | Status: AC
  Administered 2016-01-16: 384 mg via INTRAVENOUS
  Filled 2016-01-16: qty 64

## 2016-01-16 MED ORDER — SODIUM CHLORIDE 0.9 % IV SOLN
Freq: Once | INTRAVENOUS | Status: AC
  Administered 2016-01-16: 09:00:00 via INTRAVENOUS

## 2016-01-16 MED ORDER — PEGFILGRASTIM 6 MG/0.6ML ~~LOC~~ PSKT
6.0000 mg | PREFILLED_SYRINGE | Freq: Once | SUBCUTANEOUS | Status: AC
  Administered 2016-01-16: 6 mg via SUBCUTANEOUS
  Filled 2016-01-16: qty 0.6

## 2016-01-16 MED ORDER — PALONOSETRON HCL INJECTION 0.25 MG/5ML
0.2500 mg | Freq: Once | INTRAVENOUS | Status: AC
  Administered 2016-01-16: 0.25 mg via INTRAVENOUS

## 2016-01-16 MED ORDER — SODIUM CHLORIDE 0.9 % IJ SOLN
10.0000 mL | INTRAMUSCULAR | Status: DC | PRN
  Administered 2016-01-16: 10 mL
  Filled 2016-01-16: qty 10

## 2016-01-16 MED ORDER — SODIUM CHLORIDE 0.9 % IV SOLN
900.0000 mg | Freq: Once | INTRAVENOUS | Status: AC
  Administered 2016-01-16: 900 mg via INTRAVENOUS
  Filled 2016-01-16: qty 90

## 2016-01-16 MED ORDER — FAMOTIDINE IN NACL 20-0.9 MG/50ML-% IV SOLN
INTRAVENOUS | Status: AC
  Filled 2016-01-16: qty 50

## 2016-01-16 MED ORDER — FAMOTIDINE IN NACL 20-0.9 MG/50ML-% IV SOLN
20.0000 mg | Freq: Once | INTRAVENOUS | Status: AC
  Administered 2016-01-16: 20 mg via INTRAVENOUS

## 2016-01-17 ENCOUNTER — Telehealth: Payer: Self-pay

## 2016-01-17 DIAGNOSIS — C569 Malignant neoplasm of unspecified ovary: Secondary | ICD-10-CM

## 2016-01-17 LAB — CANCER ANTIGEN 125 (PARALLEL TESTING): CA 125: 79 U/mL — ABNORMAL HIGH (ref <35)

## 2016-01-17 LAB — CA 125: Cancer Antigen (CA) 125: 83.8 U/mL — ABNORMAL HIGH (ref 0.0–38.1)

## 2016-01-17 MED ORDER — LORAZEPAM 1 MG PO TABS: 1.0000 mg | ORAL_TABLET | Freq: Three times a day (TID) | ORAL | Status: DC

## 2016-01-17 NOTE — Telephone Encounter (Signed)
L/M that a refill for her ativan was sent to her pharmacy as se requested.  Stated that her CA-125 level was down to 79 yesterday from 144 four weeks ago. She can call back to the office if she has any questions or concerns.

## 2016-01-18 ENCOUNTER — Encounter (HOSPITAL_COMMUNITY): Payer: Self-pay

## 2016-01-20 ENCOUNTER — Ambulatory Visit (HOSPITAL_COMMUNITY)
Admission: RE | Admit: 2016-01-20 | Discharge: 2016-01-20 | Disposition: A | Discharge status: Home / Self Care | Payer: BC Managed Care – PPO | Source: Ambulatory Visit | Attending: Oncology | Admitting: Oncology

## 2016-01-20 ENCOUNTER — Encounter (HOSPITAL_COMMUNITY): Payer: Self-pay

## 2016-01-20 DIAGNOSIS — C569 Malignant neoplasm of unspecified ovary: Secondary | ICD-10-CM | POA: Insufficient documentation

## 2016-01-20 DIAGNOSIS — R188 Other ascites: Secondary | ICD-10-CM | POA: Insufficient documentation

## 2016-01-20 DIAGNOSIS — K5641 Fecal impaction: Secondary | ICD-10-CM | POA: Diagnosis not present

## 2016-01-20 DIAGNOSIS — J90 Pleural effusion, not elsewhere classified: Secondary | ICD-10-CM | POA: Diagnosis not present

## 2016-01-20 DIAGNOSIS — R935 Abnormal findings on diagnostic imaging of other abdominal regions, including retroperitoneum: Secondary | ICD-10-CM | POA: Insufficient documentation

## 2016-01-20 MED ORDER — IOHEXOL 300 MG/ML  SOLN
50.0000 mL | Freq: Once | INTRAMUSCULAR | Status: AC | PRN
  Administered 2016-01-20: 50 mL via ORAL

## 2016-01-20 MED ORDER — IOHEXOL 300 MG/ML  SOLN
100.0000 mL | Freq: Once | INTRAMUSCULAR | Status: AC | PRN
  Administered 2016-01-20: 100 mL via INTRAVENOUS

## 2016-01-25 ENCOUNTER — Ambulatory Visit: Payer: BC Managed Care – PPO | Attending: Gynecologic Oncology | Admitting: Gynecologic Oncology

## 2016-01-25 ENCOUNTER — Other Ambulatory Visit: Payer: Self-pay | Admitting: Oncology

## 2016-01-25 ENCOUNTER — Encounter: Payer: Self-pay | Admitting: Gynecologic Oncology

## 2016-01-25 VITALS — BP 117/80 | HR 94 | Temp 98.2°F | Resp 18 | Ht 65.0 in | Wt 252.5 lb

## 2016-01-25 DIAGNOSIS — C569 Malignant neoplasm of unspecified ovary: Secondary | ICD-10-CM | POA: Diagnosis not present

## 2016-01-25 DIAGNOSIS — G620 Drug-induced polyneuropathy: Secondary | ICD-10-CM | POA: Diagnosis not present

## 2016-01-25 DIAGNOSIS — T451X5A Adverse effect of antineoplastic and immunosuppressive drugs, initial encounter: Secondary | ICD-10-CM | POA: Diagnosis not present

## 2016-01-25 DIAGNOSIS — Z1371 Encounter for nonprocreative screening for genetic disease carrier status: Secondary | ICD-10-CM | POA: Diagnosis not present

## 2016-01-25 MED ORDER — GABAPENTIN 100 MG PO CAPS: 100.0000 mg | ORAL_CAPSULE | Freq: Every day | ORAL | Status: AC

## 2016-01-25 NOTE — Progress Notes (Signed)
Follow Up Note: Gyn-Onc  Sheena Miles 30 y.o. female  CC:  Chief Complaint  Patient presents with  . Follow-up    Ovarian cancer    HPI:  Sheena Miles is a 30 year old female, gravida 0, initially referred by Dr. [name] for a newly diagnosed ovarian mass.  She had irregular cycles for many years and went through menarche at the age of 22.  She was experiencing a lot of stress as she had a difficult relationship with her husband and she left him in June of this year. She began experiencing some pain in her shoulder, abdomen, leg and began having some irregular bowel movements which she felt related to stress.  She sought care at an Urgent Care for her moderate anxiety.  At that time, an exam was performed that revealed some tenderness in the right lower quadrant. She also had a low-grade temperature and was referred to the emergency room to rule out appendicitis or cholecystitis. She reports her abdomen was getting "hard" in early August with intermittent back pain. She also began experiencing pelvic pain over the past 2 weeks with intermittent nausea/vomiting that started in June. She's gained approximately 30 pounds in the last 5-6 months and is been having increasing fatigue.  In the emergency room, her CT scan revealed: She had a small right and large left sided pleural effusion with mild underlying atelectasis bilaterally. The gallbladder appeared normal. There was some ascites with a fluid collection in the area of the falciform ligament measuring 4 x 3 cm. There was another lentiform collection with a similar attenuation. Overall she had moderate volume ascites throughout the abdomen. The pancreas, spleen, adrenals, kidneys, stomach, bowel were unremarkable. There are large diaphragmatic lymph nodes anterior to the heart measuring up to 1.3 cm. There are numerous small mesenteric lymph nodes present. The uterus and ovaries were not discretely identified. There is a multi-cystic  septated complex mass arising out of the low pelvis and extending into the left abdomen. It measured 13 x 7 x 7 cm. A prominently had cystic component along the cranial edge along the caudal edge of the abnormality. There is infiltration and probable carcinomatosis of the omentum. There were numerous omental implants with the largest measuring 3.8 cm.   She was initially seen on 08/31/15.  Lab work resulted: CA 125 33145, LDH 663, Inhibin B <10, CEA <0.5, AFP 2.2. On 09/06/15, she underwent an exploratory laparotomy with supracervical hysterectomy, bilateral salpingoophorectomy, omentectomy, peritoneal stripping, resection of bladder nodule, rectosigmoid resection with mobilization of the splenic flexure and primary end-to-end anastamosis (R2 resection). Operative findings included: 1. 3L of bloody ascites on abdominal entry  2. 12cm omental mass adherent to the colon, pelvic mass, anterior abdominal wall - resected and frozen with serous cancer  3. Bilateral adnexal masses densely adherent to the uterus, sigmoid colon, and rectum - resected en bloc  4. Pelvic peritoneal nodularity  5. 3cm Bladder nodule - resected  6. Residual tumor along the entirely of the small bowel mesentery with multiple nodules up to 1.5cm in size, miliary disease of the diaphragm, and peritoneal nodularity along the pelvis and rectum (R2)  7. Normal appearing gallbladder, liver, spleen, and stomach  8. Negative bubble test following primary anastamosis of the recto-sigmoid with two donuts removed following anastamosis  Final pathology revealed: Final Diagnosis  FSA, A: Omentum, partial omentectomy  - Extensive metastatic serous carcinoma  B: Uterus, cervix, bilateral tubes and ovaries and colon, hysterectomy, bilateral salpingo-oophorectomy and partial colectomy  -  High grade serous carcinoma, see synoptic report below  C: Colon, anastomotic rings, excision  - Colonic mucosa and wall negative for malignancy   Port a  cath was placed at [facility] on 09/09/15.       Interval History:  She is overall doing very well.  She has completed 6 cycles of paclitaxel and carboplatin. She'll receive this last cycle on January 16. Her most recent CA-125 remains elevated at around 88. She had a CT scan post cycle number 6. It revealed: IMPRESSION: 1. Although the recent oncology notes indicate that at the time of completion of surgery the patient had residual tumor along the small bowel mesentery and diaphragmatic and peritoneal nodularity, this tumor deposition seems much less notable on today's exam. There is some trace edema along the paracolic gutters and a small nodule in the adipose tissue above the transverse colon, but the diffuse peritoneal disease described at the time of completion of surgery is no longer present indicating a good response to therapy. 2. Small right pleural effusion, nonspecific. 3. Abnormally thickened appendix at 1.2 cm. This could be from low grade inflammation, tumor deposition along the appendix, or a small appendiceal mass. I doubt that this is a mucocele given the lack of low-density. 4. Trace ascites along Morrison's pouch. Trace ascites below the spleen. 5. Prominent stool throughout the colon favors constipation.   I reviewed these results with her and her mother. We also reviewed the results  Of her foundation One testing. 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  There are currently several trials ongoing in women with ovarian cancer with regards to pten and p53.  So while she's not eligible for these trials at this time it may serve as an option for future treatment. She was excited to know that there might be other options.  She states that after cycle #5 of chemotherapy she began having some increasing neuropathy in her left hand left thigh and has some numbness. She is not changed her gait. Occasionally her foot is painful on the left side. There is fewer issues her symptoms  on her right. 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 She is course is somewhat disappointed that there we need to continue with chemotherapy but she understands the rationale and his acceptance of this. We discussed changing to Taxotere secondary to her increase in neuropathy. She also had concerns that she has a friend who developed a platinum allergy. We discussed that this can happen with increasing cycles of carboplatin but if she experiences it that it can be treated adequately and we can often proceed with desensitization protocol did allow [place] to give patient's continued carboplatin.  Current Meds:  Outpatient Encounter Prescriptions as of 01/25/2016  Medication Sig  . dexamethasone (DECADRON) 4 MG tablet Take 5 tablets with food 12 hours and 6 hours prior to Taxol chemotherapy  . ferrous fumarate (HEMOCYTE - 106 MG FE) 325 (106 FE) MG TABS tablet Take 1 tablet daily on an empty stomach with OJ.  . hydrocortisone (ANUSOL-HC) 2.5 % rectal cream Place 1 application rectally 2 (two) times daily as needed for hemorrhoids or itching.  Marland Kitchen HYDROmorphone (DILAUDID) 2 MG tablet 1 tablet every 4-6 hours as needed for pain  . lidocaine-prilocaine (EMLA) cream Apply 1 application topically as needed.  Marland Kitchen LORazepam (ATIVAN) 1 MG tablet Take 1 tablet (1 mg total) by mouth every 8 (eight) hours. for nausea.  . Multiple Vitamins-Minerals (MULTI-VITAMIN GUMMIES)  CHEW Chew by mouth daily.  . ondansetron (ZOFRAN) 8 MG tablet Take 1 tablet (8 mg total) by mouth every 8 (eight) hours as needed for nausea or vomiting.  . prochlorperazine (COMPAZINE) 10 MG tablet Take 1 tablet (10 mg total) by mouth every 6 (six) hours as needed.  . gabapentin (NEURONTIN) 100 MG capsule Take 1 capsule (100 mg total) by mouth at bedtime.  Marland Kitchen omeprazole (PRILOSEC) 10 MG capsule Take 10 mg by mouth daily.    No facility-administered encounter medications on file as of 01/25/2016.    Allergy:  Allergies  Allergen  Reactions  . Codeine Palpitations    Described by patient as "horrible panic attack feeling and shortness of breath"    Social Hx:   Social History   Social History  . Marital Status: Legally Separated    Spouse Name: N/A  . Number of Children: N/A  . Years of Education: N/A   Occupational History  . Not on file.   Social History Main Topics  . Smoking status: Never Smoker   . Smokeless tobacco: Never Used     Comment: some hookah in college  . Alcohol Use: Yes     Comment: 1-2 nights week socially  . Drug Use: No  . Sexual Activity: No   Other Topics Concern  . Not on file   Social History Narrative    Past Surgical Hx:  Past Surgical History  Procedure Laterality Date  . Wisdom tooth extraction  2013    Past Medical Hx:  Past Medical History  Diagnosis Date  . Allergy   . Asthma     exercise induced    Family Hx:  Family History  Problem Relation Age of Onset  . Diabetes Father   . Congestive Heart Failure Father   . Atrial fibrillation Father   . Crohn's disease Maternal Grandmother   . Breast cancer Paternal Grandmother     dx. 4s  . Skin cancer Paternal Uncle     unspecified type; dx. 25 or younger  . Heart attack Paternal Grandfather     Vitals:  Blood pressure 117/80, pulse 94, temperature 98.2 F (36.8 C), temperature source Oral, resp. rate 18, height _0  (1.651 m), weight 252 lb 8 oz (114.533 kg), SpO2 100 %.  Physical Exam:  General: Well developed, well nourished female in no acute distress. Alert and oriented x 3.   Neck: Supple, no lymphadenopathy, no thyromegaly.  Lungs: Clear to auscultation bilaterally.  Cardiovascular: Regular rate and rhythm  Abdomen: Abdomen soft, non-tender and obese. Active bowel sounds in all quadrants. Well-healed vertical midline incision.   Assessment/Plan:  30 year old s/p exploratory laparotomy with supracervical hysterectomy, bilateral salpingoophorectomy, omentectomy, peritoneal stripping,  resection of bladder nodule, rectosigmoid resection with mobilization of the splenic flexure and primary end-to-end anastamosis (R2 resection)  on 08/31/15 for high grade serous carcinoma of the ovary.    First cycle of chemotherapy on 09/26/15.  Her CA-125 is coming down very nicely. After 6 cycles of chemotherapy is 88.  BRCA negative.  She has persistent disease after 6 cycles of paclitaxel and carboplatin with small amount of ascites on imaging as well as slightly elevated CO2 5. Secondary to her neurologic complaints of believe this should switch to Taxotere. The differences between Taxol and Taxotere were discussed with the patient. We'll also start her on Neurontin 100 mg daily at bedtime. She will start taking vitamin B 6. Her questions were elicited in answer to her satisfaction. She  has follow-up scheduled with Dr. Marko Plume.  Nancy Marus A., MD 01/25/2016, 3:41 PM

## 2016-01-25 NOTE — Patient Instructions (Signed)
Begin taking Vitamin B6 200 mg once daily along with gabapentin 100 mg at bedtime.  We can increase the dose of the gabapentin if needed.  Plan to continue with chemotherapy until the CA 125 normalizes.  Plan to change from Taxol to Taxotere due to neuropathy symptoms.  Gabapentin capsules or tablets What is this medicine? GABAPENTIN (GA ba pen tin) is used to control partial seizures in adults with epilepsy. It is also used to treat certain types of nerve pain. This medicine may be used for other purposes; ask your health care provider or pharmacist if you have questions. What should I tell my health care provider before I take this medicine? They need to know if you have any of these conditions: -kidney disease -suicidal thoughts, plans, or attempt; a previous suicide attempt by you or a family member -an unusual or allergic reaction to gabapentin, other medicines, foods, dyes, or preservatives -pregnant or trying to get pregnant -breast-feeding How should I use this medicine? Take this medicine by mouth with a glass of water. Follow the directions on the prescription label. You can take it with or without food. If it upsets your stomach, take it with food.Take your medicine at regular intervals. Do not take it more often than directed. Do not stop taking except on your doctor's advice. If you are directed to break the 600 or 800 mg tablets in half as part of your dose, the extra half tablet should be used for the next dose. If you have not used the extra half tablet within 28 days, it should be thrown away. A special MedGuide will be given to you by the pharmacist with each prescription and refill. Be sure to read this information carefully each time. Talk to your pediatrician regarding the use of this medicine in children. Special care may be needed. Overdosage: If you think you have taken too much of this medicine contact a poison control center or emergency room at once. NOTE: This medicine  is only for you. Do not share this medicine with others. What if I miss a dose? If you miss a dose, take it as soon as you can. If it is almost time for your next dose, take only that dose. Do not take double or extra doses. What may interact with this medicine? Do not take this medicine with any of the following medications: -other gabapentin products This medicine may also interact with the following medications: -alcohol -antacids -antihistamines for allergy, cough and cold -certain medicines for anxiety or sleep -certain medicines for depression or psychotic disturbances -homatropine; hydrocodone -naproxen -narcotic medicines (opiates) for pain -phenothiazines like chlorpromazine, mesoridazine, prochlorperazine, thioridazine This list may not describe all possible interactions. Give your health care provider a list of all the medicines, herbs, non-prescription drugs, or dietary supplements you use. Also tell them if you smoke, drink alcohol, or use illegal drugs. Some items may interact with your medicine. What should I watch for while using this medicine? Visit your doctor or health care professional for regular checks on your progress. You may want to keep a record at home of how you feel your condition is responding to treatment. You may want to share this information with your doctor or health care professional at each visit. You should contact your doctor or health care professional if your seizures get worse or if you have any new types of seizures. Do not stop taking this medicine or any of your seizure medicines unless instructed by your doctor or health  care professional. Stopping your medicine suddenly can increase your seizures or their severity. Wear a medical identification bracelet or chain if you are taking this medicine for seizures, and carry a card that lists all your medications. You may get drowsy, dizzy, or have blurred vision. Do not drive, use machinery, or do anything  that needs mental alertness until you know how this medicine affects you. To reduce dizzy or fainting spells, do not sit or stand up quickly, especially if you are an older patient. Alcohol can increase drowsiness and dizziness. Avoid alcoholic drinks. Your mouth may get dry. Chewing sugarless gum or sucking hard candy, and drinking plenty of water will help. The use of this medicine may increase the chance of suicidal thoughts or actions. Pay special attention to how you are responding while on this medicine. Any worsening of mood, or thoughts of suicide or dying should be reported to your health care professional right away. Women who become pregnant while using this medicine may enroll in the [place] Pregnancy Registry by calling [phone]. This registry collects information about the safety of antiepileptic drug use during pregnancy. What side effects may I notice from receiving this medicine? Side effects that you should report to your doctor or health care professional as soon as possible: -allergic reactions like skin rash, itching or hives, swelling of the face, lips, or tongue -worsening of mood, thoughts or actions of suicide or dying Side effects that usually do not require medical attention (report to your doctor or health care professional if they continue or are bothersome): -constipation -difficulty walking or controlling muscle movements -dizziness -nausea -slurred speech -tiredness -tremors -weight gain This list may not describe all possible side effects. Call your doctor for medical advice about side effects. You may report side effects to FDA at 1-800-FDA-1088. Where should I keep my medicine? Keep out of reach of children. This medicine may cause accidental overdose and death if it taken by other adults, children, or pets. Mix any unused medicine with a substance like cat litter or coffee grounds. Then throw the medicine away in a sealed  container like a sealed bag or a coffee can with a lid. Do not use the medicine after the expiration date. Store at room temperature between 15 and 30 degrees C (59 and 86 degrees F). NOTE: This sheet is a summary. It may not cover all possible information. If you have questions about this medicine, talk to your doctor, pharmacist, or health care provider.    2016, Elsevier/Gold Standard. (2014-02-12 15:26:50)  Docetaxel injection What is this medicine? DOCETAXEL (doe se TAX el) is a chemotherapy drug. It targets fast dividing cells, like cancer cells, and causes these cells to die. This medicine is used to treat many types of cancers like breast cancer, certain stomach cancers, head and neck cancer, lung cancer, and prostate cancer. This medicine may be used for other purposes; ask your health care provider or pharmacist if you have questions. What should I tell my health care provider before I take this medicine? They need to know if you have any of these conditions: -infection (especially a virus infection such as chickenpox, cold sores, or herpes) -liver disease -low blood counts, like low white cell, platelet, or red cell counts -an unusual or allergic reaction to docetaxel, polysorbate 80, other chemotherapy agents, other medicines, foods, dyes, or preservatives -pregnant or trying to get pregnant -breast-feeding How should I use this medicine? This drug is given as an infusion into a  vein. It is administered in a hospital or clinic by a specially trained health care professional. Talk to your pediatrician regarding the use of this medicine in children. Special care may be needed. Overdosage: If you think you have taken too much of this medicine contact a poison control center or emergency room at once. NOTE: This medicine is only for you. Do not share this medicine with others. What if I miss a dose? It is important not to miss your dose. Call your doctor or health care professional  if you are unable to keep an appointment. What may interact with this medicine? -cyclosporine -erythromycin -ketoconazole -medicines to increase blood counts like filgrastim, pegfilgrastim, sargramostim -vaccines Talk to your doctor or health care professional before taking any of these medicines: -acetaminophen -aspirin -ibuprofen -ketoprofen -naproxen This list may not describe all possible interactions. Give your health care provider a list of all the medicines, herbs, non-prescription drugs, or dietary supplements you use. Also tell them if you smoke, drink alcohol, or use illegal drugs. Some items may interact with your medicine. What should I watch for while using this medicine? Your condition will be monitored carefully while you are receiving this medicine. You will need important blood work done while you are taking this medicine. This drug may make you feel generally unwell. This is not uncommon, as chemotherapy can affect healthy cells as well as cancer cells. Report any side effects. Continue your course of treatment even though you feel ill unless your doctor tells you to stop. In some cases, you may be given additional medicines to help with side effects. Follow all directions for their use. Call your doctor or health care professional for advice if you get a fever, chills or sore throat, or other symptoms of a cold or flu. Do not treat yourself. This drug decreases your body's ability to fight infections. Try to avoid being around people who are sick. This medicine may increase your risk to bruise or bleed. Call your doctor or health care professional if you notice any unusual bleeding. This medicine may contain alcohol in the product. You may get drowsy or dizzy. Do not drive, use machinery, or do anything that needs mental alertness until you know how this medicine affects you. Do not stand or sit up quickly, especially if you are an older patient. This reduces the risk of dizzy  or fainting spells. Avoid alcoholic drinks. Do not become pregnant while taking this medicine. Women should inform their doctor if they wish to become pregnant or think they might be pregnant. There is a potential for serious side effects to an unborn child. Talk to your health care professional or pharmacist for more information. Do not breast-feed an infant while taking this medicine. What side effects may I notice from receiving this medicine? Side effects that you should report to your doctor or health care professional as soon as possible: -allergic reactions like skin rash, itching or hives, swelling of the face, lips, or tongue -low blood counts - This drug may decrease the number of white blood cells, red blood cells and platelets. You may be at increased risk for infections and bleeding. -signs of infection - fever or chills, cough, sore throat, pain or difficulty passing urine -signs of decreased platelets or bleeding - bruising, pinpoint red spots on the skin, black, tarry stools, nosebleeds -signs of decreased red blood cells - unusually weak or tired, fainting spells, lightheadedness -breathing problems -fast or irregular heartbeat -low blood pressure -mouth sores -nausea  and vomiting -pain, swelling, redness or irritation at the injection site -pain, tingling, numbness in the hands or feet -swelling of the ankle, feet, hands -weight gain Side effects that usually do not require medical attention (report to your prescriber or health care professional if they continue or are bothersome): -bone pain -complete hair loss including hair on your head, underarms, pubic hair, eyebrows, and eyelashes -diarrhea -excessive tearing -changes in the color of fingernails -loosening of the fingernails -nausea -muscle pain -red flush to skin -sweating -weak or tired This list may not describe all possible side effects. Call your doctor for medical advice about side effects. You may report  side effects to FDA at 1-800-FDA-1088. Where should I keep my medicine? This drug is given in a hospital or clinic and will not be stored at home. NOTE: This sheet is a summary. It may not cover all possible information. If you have questions about this medicine, talk to your doctor, pharmacist, or health care provider.    2016, Elsevier/Gold Standard. (2015-01-03 16:04:57)

## 2016-01-30 ENCOUNTER — Telehealth: Payer: Self-pay | Admitting: *Deleted

## 2016-01-30 ENCOUNTER — Other Ambulatory Visit: Payer: Self-pay | Admitting: Oncology

## 2016-01-30 DIAGNOSIS — C569 Malignant neoplasm of unspecified ovary: Secondary | ICD-10-CM

## 2016-01-30 NOTE — Telephone Encounter (Signed)
"  I need to begin taxotere chemotherapy  instead of taxol next Monday 02-06-2016.  I saw my other oncologist at [facility] last Wednesday.  I want to let her know to go ahead and have this scheduled.   I also have a toenail that fell off.  Do I need to call my PCP or is this related to treatment." Chemotherapy affects the nails.  Can discolor, separate from nail bed, fall off and vulnerable for infection.  Denies fever, open skin or drainage to nails of hands or feet.  "Throbbing pain and friction of shoe on nail bed.  I covered area with a band aid. Asked if epsom salt bath is okay.  Return number [phone].

## 2016-01-31 NOTE — Telephone Encounter (Addendum)
LM in Ms. Procter's voice mail that Dr. Marko Plume is aware of chemotherapy being changed to Taxotere/Carboplatin.  She sent orders to the schedulers yesterday to schedule appointment.  The schedulers will call with appointment time or this can be discussed at appointment with Dr. Marko Plume on 02-02-16 at 1430. Dr. Alycia Rossetti printed out information about Taxotere.  Dr. Marko Plume would like for her nurse to review side effects and decadron premedication for Taxotere. (Decadron 8 mg bid x 3 days beginning the day prior to Taxotere). If she can call back to Dr. Mariana Kaufman nurse tomorrow to discuss or it can be discussed at appointment 02-02-16.

## 2016-01-31 NOTE — Telephone Encounter (Signed)
Pt called requesting her schedule for infusion next week.

## 2016-02-01 ENCOUNTER — Telehealth: Payer: Self-pay | Admitting: Oncology

## 2016-02-01 ENCOUNTER — Other Ambulatory Visit: Payer: Self-pay | Admitting: Oncology

## 2016-02-01 NOTE — Telephone Encounter (Signed)
Called and left a message with 2/7 chemo as 2/6 is capped   Sheena Miles

## 2016-02-02 ENCOUNTER — Other Ambulatory Visit: Payer: Self-pay

## 2016-02-02 ENCOUNTER — Encounter: Payer: Self-pay | Admitting: Oncology

## 2016-02-02 ENCOUNTER — Ambulatory Visit (HOSPITAL_BASED_OUTPATIENT_CLINIC_OR_DEPARTMENT_OTHER): Payer: BC Managed Care – PPO | Admitting: Oncology

## 2016-02-02 ENCOUNTER — Telehealth: Payer: Self-pay | Admitting: Oncology

## 2016-02-02 ENCOUNTER — Other Ambulatory Visit (HOSPITAL_BASED_OUTPATIENT_CLINIC_OR_DEPARTMENT_OTHER): Payer: BC Managed Care – PPO

## 2016-02-02 VITALS — BP 114/74 | HR 88 | Temp 98.0°F | Resp 18 | Ht 65.0 in | Wt 253.6 lb

## 2016-02-02 DIAGNOSIS — D701 Agranulocytosis secondary to cancer chemotherapy: Secondary | ICD-10-CM

## 2016-02-02 DIAGNOSIS — C562 Malignant neoplasm of left ovary: Secondary | ICD-10-CM | POA: Diagnosis not present

## 2016-02-02 DIAGNOSIS — C569 Malignant neoplasm of unspecified ovary: Secondary | ICD-10-CM

## 2016-02-02 DIAGNOSIS — C561 Malignant neoplasm of right ovary: Secondary | ICD-10-CM | POA: Diagnosis not present

## 2016-02-02 DIAGNOSIS — D6481 Anemia due to antineoplastic chemotherapy: Secondary | ICD-10-CM

## 2016-02-02 DIAGNOSIS — R112 Nausea with vomiting, unspecified: Secondary | ICD-10-CM

## 2016-02-02 DIAGNOSIS — J90 Pleural effusion, not elsewhere classified: Secondary | ICD-10-CM

## 2016-02-02 DIAGNOSIS — Z95828 Presence of other vascular implants and grafts: Secondary | ICD-10-CM

## 2016-02-02 DIAGNOSIS — C786 Secondary malignant neoplasm of retroperitoneum and peritoneum: Secondary | ICD-10-CM

## 2016-02-02 DIAGNOSIS — T451X5A Adverse effect of antineoplastic and immunosuppressive drugs, initial encounter: Secondary | ICD-10-CM

## 2016-02-02 LAB — COMPREHENSIVE METABOLIC PANEL
ALBUMIN: 4.2 g/dL (ref 3.5–5.0)
ALK PHOS: 92 U/L (ref 40–150)
ALT: 20 U/L (ref 0–55)
AST: 18 U/L (ref 5–34)
Anion Gap: 12 mEq/L — ABNORMAL HIGH (ref 3–11)
BILIRUBIN TOTAL: 0.4 mg/dL (ref 0.20–1.20)
BUN: 18.9 mg/dL (ref 7.0–26.0)
CALCIUM: 9.9 mg/dL (ref 8.4–10.4)
CO2: 25 mEq/L (ref 22–29)
CREATININE: 0.7 mg/dL (ref 0.6–1.1)
Chloride: 103 mEq/L (ref 98–109)
EGFR: >90 mL/min/{1.73_m2} (ref >90)
GLUCOSE: 84 mg/dL (ref 70–140)
POTASSIUM: 3.5 meq/L (ref 3.5–5.1)
SODIUM: 140 meq/L (ref 136–145)
TOTAL PROTEIN: 7.3 g/dL (ref 6.4–8.3)

## 2016-02-02 LAB — CBC WITH DIFFERENTIAL/PLATELET
BASO%: 0.3 % (ref 0.0–2.0)
BASOS ABS: 0 10*3/uL (ref 0.0–0.1)
EOS ABS: 0 10*3/uL (ref 0.0–0.5)
EOS%: 0.3 % (ref 0.0–7.0)
HEMATOCRIT: 36 % (ref 34.8–46.6)
HEMOGLOBIN: 12 g/dL (ref 11.6–15.9)
LYMPH#: 1.8 10*3/uL (ref 0.9–3.3)
LYMPH%: 24.7 % (ref 14.0–49.7)
MCH: 31 pg (ref 25.1–34.0)
MCHC: 33.3 g/dL (ref 31.5–36.0)
MCV: 93 fL (ref 79.5–101.0)
MONO#: 0.5 10*3/uL (ref 0.1–0.9)
MONO%: 7.3 % (ref 0.0–14.0)
NEUT%: 67.4 % (ref 38.4–76.8)
NEUTROS ABS: 5 10*3/uL (ref 1.5–6.5)
Platelets: 268 10*3/uL (ref 145–400)
RBC: 3.87 10*6/uL (ref 3.70–5.45)
RDW: 14.9 % — AB (ref 11.2–14.5)
WBC: 7.4 10*3/uL (ref 3.9–10.3)

## 2016-02-02 MED ORDER — DEXAMETHASONE 4 MG PO TABS: ORAL_TABLET | ORAL | Status: DC

## 2016-02-02 NOTE — Telephone Encounter (Signed)
appointments made and avs printed for patient °

## 2016-02-02 NOTE — Progress Notes (Signed)
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 She completed 6 cycles of carboplatin taxol on 12-19-15 (cycle 1 given at [facility] 09-26-15). CT CAP 01-20-16 showed some persistant disease; CA 125 was 79 on 01-16-16. Patient met with Dr Alycia Rossetti on 01-25-16, with recommendation to continue chemotherapy until  CA 125 normalizes, with carboplatin + taxotere due to peripheral neuropathy.   CT CAP 01-20-16 showed no diffuse peritoneal disease remaining, however there was small ascites and small right pleural effusion, thickened retrocecal appendix, right external iliac node 0.7 cm. Patient has felt reasonably well since last treated, with only new problem loss of toenail on right foot. She does have some peripheral neuropathy feet> hands, not interfering with activity. She has intermittent pain lateral left thigh, possibly related to low back., no rash. Gabapentin 100 mg at hs seems to help the neuropathy and has also helped sleep. Appetite is fair, still occasional nausea. Bowels are moving regularly. No fever or symptoms of infection. No increased SOB or other symptoms of anemia. No problems with PAC. No LE swelling. She is working half days.  Remainder of 10 point Review of Systems negative.    Power PAC placed at [facility] 09-09-15 Flu vaccine 10-27-15 Genetics testing 10-27-15 normal ([facility] panel).  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    ONCOLOGIC HISTORY Patient initially developed constipation and abdominal pain summer 2016 which she thought was stress related, then worsening abdominal and pelvic pain, some back pain and some fever; she had weight gain ~ 30 lbs in prior 5-6 months. She was seen at [facility] with concern for acute  appendicitis. She was evaluated at local ED, I believe [facility], with CT reportedly showing large left and small right pleural effusions, moderate ascites, complex septated cystic mass in low pelvis into left abdomen 13 x 7 x 7 cm, adenopathy Including large diaphragmatic lymph nodes anterior to heart and numerous mesenteric nodes, and apparent carcinomatosis with implants in omentum up to 3.8 cm. She was seen by Dr [name] and referred to Dr Alycia Rossetti. At Dr Elenora Gamma exam 08-31-15 there was large mass in cul de sac and abdominal fluid wave, decreased BS left lower 1/2 of chest. Lab studies included CA 125 33,145; inhibin B <10; CEA <0.5, AFP 2.2, LDH 1281 and HCG negative. Surgery by Dr Alycia Rossetti at [facility] on 09-06-15 was exploratory laparotomy with supracervical hysterectomy, BSO, omentectomy, resection with primary reanastomosis of rectosigmoid and peritoneal stripping. Intraoperative findings included 3 liters of bloody ascites, 12 cm omental mass adherent to uterus, sigmoid colon and rectum which was resected en bloc, bladder nodule resected. At completion of surgery there was residual tumor along entirety of small bowel mesentery with multiple nodules up to 1.5 cm, miliary disease on diaphragm, and peritoneal nodularity along pelvis and rectum (R2 resection). Pathology [facility] [phone] high grade serous carcinoma of bilateral ovaries involving bilateral tubes, uterine wall anterior and posterior and lower uterine segment, omentum, colon. No nodes submitted.  She was transfused 2 units PRBCs on each 9-9 and 09-13-15 for hemoglobin as low as 6.8 - 7.1. She had urgent right thoracentesis for 750 cc on 09-08-15 with cytology positive for high grade serous carcinoma ([id number]) and left thoracentesis for 400 cc on 09-09-15. CT angio  chest on POD 1 was negative for PE. She tolerated morphine by PCA post operatively. She had mild cellulitis at abdominal incision on day of DC, begun on Keflex then. Psychiatry saw  in hospital, begun on zoloft. She was discharged home with 28 days of lovenox. She saw Dr Alycia Rossetti on 09-19-15 with staples removed, however wound opened that evening. She was seen by gyn onc provider on 09-23-15, with wound open 3 x 5 cm, 3 cm deep, no evidence of infection. Wet to dry dressings bid were begun, ongoing. She had follow up wound check on 09-27-15, no findings of concern. GOG protocol at [facility] had delay, so that treatment given off protocol with first cycle of carboplatin taxol at [place] on 09-26-15. She had additional 5 cycles of carboplatin taxol at [facility] thru 12-19-15. Restaging CT CAP showed no diffuse peritoneal disease remaining, small ascites and small right pleural effusion, thickened retrocecal appendix, right external iliac node 0.7 cm.    Objective:  Vital signs in last 24 hours:  BP 114/74 mmHg  Pulse 88  Temp(Src) 98 F (36.7 C) (Oral)  Resp 18  Ht 5' 5"  (1.651 m)  Wt 253 lb 9.6 oz (115.032 kg)  BMI 42.20 kg/m2  SpO2 100% Weight up 1 lb Alert, oriented and appropriate. Ambulatory without difficulty.  Alopecia  HEENT:PERRL, sclerae not icteric. Oral mucosa moist without lesions, posterior pharynx clear.  Neck supple. No JVD.  Lymphatics:no cervical,supraclavicular or inguinal adenopathy Resp: clear to auscultation bilaterally and normal percussion to lower fields bilaterally Cardio: regular rate and rhythm. No gallop. GI: abdomen obese, soft, nontender, not distended, no mass or organomegaly. Normally active bowel sounds. Surgical incision not remarkable. Musculoskeletal/ Extremities: without pitting edema, cords, tenderness. Back not tender. Neuro:  peripheral neuropathy as noted. Otherwise nonfocal. PSYCH appropriate mood and affect Skin without rash, ecchymosis, petechiae Portacath-without erythema or tenderness  Lab Results:  Results for orders placed or performed in visit on 02/02/16  CBC with Differential  Result Value Ref Range   WBC 7.4 3.9 - 10.3 10e3/uL    NEUT# 5.0 1.5 - 6.5 10e3/uL   HGB 12.0 11.6 - 15.9 g/dL   HCT 36.0 34.8 - 46.6 %   Platelets 268 145 - 400 10e3/uL   MCV 93.0 79.5 - 101.0 fL   MCH 31.0 25.1 - 34.0 pg   MCHC 33.3 31.5 - 36.0 g/dL   RBC 3.87 3.70 - 5.45 10e6/uL   RDW 14.9 (H) 11.2 - 14.5 %   lymph# 1.8 0.9 - 3.3 10e3/uL   MONO# 0.5 0.1 - 0.9 10e3/uL   Eosinophils Absolute 0.0 0.0 - 0.5 10e3/uL   Basophils Absolute 0.0 0.0 - 0.1 10e3/uL   NEUT% 67.4 38.4 - 76.8 %   LYMPH% 24.7 14.0 - 49.7 %   MONO% 7.3 0.0 - 14.0 %   EOS% 0.3 0.0 - 7.0 %   BASO% 0.3 0.0 - 2.0 %  Comprehensive metabolic panel  Result Value Ref Range   Sodium 140 136 - 145 mEq/L   Potassium 3.5 3.5 - 5.1 mEq/L   Chloride 103 98 - 109 mEq/L   CO2 25 22 - 29 mEq/L   Glucose 84 70 - 140 mg/dl   BUN 18.9 7.0 - 26.0 mg/dL   Creatinine 0.7 0.6 - 1.1 mg/dL   Total Bilirubin 0.40 0.20 - 1.20 mg/dL   Alkaline Phosphatase 92 40 - 150 U/L   AST 18 5 - 34 U/L   ALT 20 0 - 55 U/L   Total  Protein 7.3 6.4 - 8.3 g/dL   Albumin 4.2 3.5 - 5.0 g/dL   Calcium 9.9 8.4 - 10.4 mg/dL   Anion Gap 12 (H) 3 - 11 mEq/L   EGFR >90 >90 ml/min/1.73 m2  CA 125  Result Value Ref Range   Cancer Antigen (CA) 125 57.3 (H) 0.0 - 38.1 U/mL  CA 125 (Parallel Testing)  Result Value Ref Range   CA 125 55 (H) <35 U/mL     Studies/Results:   EXAM: CT CHEST, ABDOMEN, AND PELVIS WITH CONTRAST 01-20-16  FINDINGS: CT CHEST FINDINGS  Mediastinum/Nodes: Right Port-A-Cath tip: Cavoatrial junction. Vertically oriented band of anterior mediastinal tissue compatible with residual thymus. No pathologic thoracic adenopathy.  Lungs/Pleura: Small right pleural effusion, nonspecific for transudative or exudative etiology. No soft tissue mass along the pleura identified. Incidental azygos fissure.  No pulmonary nodule or significant parenchymal abnormality in the lungs identified.  Musculoskeletal: Unremarkable  CT ABDOMEN PELVIS FINDINGS  Hepatobiliary:  Unremarkable. No discrete nodularity along the hepatic capsule, although there is a small amount of fluid density along Morrison's pouch. No biliary dilatation. Gallbladder normal.  Pancreas: Unremarkable  Spleen: Unremarkable  Adrenals/Urinary Tract: Unremarkable  Stomach/Bowel: The retrocecal appendix is abnormally thickened, measuring up to 1.2 cm diameter. Sigmoid anastomotic staple line observed. The extension of apparent contained gas from the vicinity of the anastomosis down towards the vaginal cuff is probably a manifestation of the type of anastomosis. Prominent stool throughout the colon favors constipation.  Vascular/Lymphatic: Right external iliac node 0.7 cm in short axis, image 100 series 2. No overtly pathologic retroperitoneal or pelvic adenopathy is appreciated.  Reproductive: Uterus absent. Ovaries absent. No recurrent mass identified in the pelvis.  Other: Prior omentectomy and peritoneal stripping. Scattered small mesenteric lymph nodes are present and there is trace edema tracking along the paracolic gutters. 6 mm nodule in the adipose tissue just above the transverse colon on image 62 series 2. Trace ascites below the spleen, image 57 series 602.  Musculoskeletal: Small bone islands in the left bony pelvis, benign.  IMPRESSION: 1. Although the recent oncology notes indicate that at the time of completion of surgery the patient had residual tumor along the small bowel mesentery and diaphragmatic and peritoneal nodularity, this tumor deposition seems much less notable on today's exam. There is some trace edema along the paracolic gutters and a small nodule in the adipose tissue above the transverse colon, but the diffuse peritoneal disease described at the time of completion of surgery is no longer present indicating a good response to therapy. 2. Small right pleural effusion, nonspecific. 3. Abnormally thickened appendix at 1.2 cm. This could be  from low grade inflammation, tumor deposition along the appendix, or a small appendiceal mass. I doubt that this is a mucocele given the lack of low-density. 4. Trace ascites along Morrison's pouch. Trace ascites below the spleen. 5. Prominent stool throughout the colon favors constipation.  Medications: I have reviewed the patient's current medications. Continue gabapentin, could increase if needed. Decadron 8 mg bid x 3 days beginning day prior to taxotere She has had teaching for taxotere by RN.  DISCUSSION We have discussed interval history as above. She is in agreement with continuing chemotherapy, understands that taxotere may have less neuropathy side effects than taxol. We have discussed ice to hands and feet around taxotere infusion, particularly as she is already noticing nail changes even with taxol. Hopefully less aching with taxotere will also make this regimen a bit easier. WIll use on pro  neulasta.   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 Malignant pleural fluid and ascites at presentation. Improvement in CA 125 tho this has not normalized, and improvement but some persistent disease by CT CAP after 6 cycles carbo taxol. Plan to continue chemo using carboplatin (+skin testing) and taxotere beginning 02-07-16, recommendation from Dr Alycia Rossetti to use "until CA 125 normalizes". Expect may want PET CT when next imaging. Genetics testing negative; Foundation One sent from [facility] on surgical path as above, not eligible for related trials now but may be option in future. 2.surgical wound healed following initial dehiscence. 3.surgical menopause with ongoing hot flashes. 4.flu vaccine 10-10-15 5.power PAC  6.chemo nausea: required EMEND and Aloxi with carbo taxol, difficult to control even with this. 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 8.hemorrhoid discomfort improved. Note rectosigmoid resection with reanastomosis at debulking surgery. Note suggestion of constipation on recent CT 9.mild chemo anemia, iron studies ok 09-30-15. Improved on supplemental ferrous fumarate, continue 10.chemo induced neutropenia with carbo taxol, received neulasta as on pro injector then. WIll need neulasta, hopefully as on pro, with taxotere  11.obesity: BMI 42   All questions answered. Verbal consent given for chemotherapy. Chemo orders entered, neulasta added to careplan as on pro. Time spent 30 min including >50% counseling and coordination of care. Cc PCP    Gordy Levan, MD   02/04/2016, 9:54 PM

## 2016-02-03 LAB — CANCER ANTIGEN 125 (PARALLEL TESTING): CA 125: 55 U/mL — ABNORMAL HIGH (ref <35)

## 2016-02-03 LAB — CA 125: Cancer Antigen (CA) 125: 57.3 U/mL — ABNORMAL HIGH (ref 0.0–38.1)

## 2016-02-04 DIAGNOSIS — Z95828 Presence of other vascular implants and grafts: Secondary | ICD-10-CM | POA: Insufficient documentation

## 2016-02-04 DIAGNOSIS — C569 Malignant neoplasm of unspecified ovary: Secondary | ICD-10-CM | POA: Insufficient documentation

## 2016-02-04 NOTE — Progress Notes (Signed)
ADDENDUM  Patient hopes to go on trip to [place] with students 2-17 thru 02-20-16 I will see her 02-13-16 with counts in case other support needed prior to the trip.  Sheena Miles

## 2016-02-07 ENCOUNTER — Ambulatory Visit (HOSPITAL_BASED_OUTPATIENT_CLINIC_OR_DEPARTMENT_OTHER): Payer: BC Managed Care – PPO

## 2016-02-07 VITALS — BP 114/61 | HR 63 | Temp 97.4°F | Resp 18

## 2016-02-07 DIAGNOSIS — C562 Malignant neoplasm of left ovary: Secondary | ICD-10-CM | POA: Diagnosis not present

## 2016-02-07 DIAGNOSIS — Z5111 Encounter for antineoplastic chemotherapy: Secondary | ICD-10-CM | POA: Diagnosis not present

## 2016-02-07 DIAGNOSIS — C561 Malignant neoplasm of right ovary: Secondary | ICD-10-CM | POA: Diagnosis not present

## 2016-02-07 DIAGNOSIS — C569 Malignant neoplasm of unspecified ovary: Secondary | ICD-10-CM

## 2016-02-07 DIAGNOSIS — Z5189 Encounter for other specified aftercare: Secondary | ICD-10-CM

## 2016-02-07 MED ORDER — SODIUM CHLORIDE 0.9 % IV SOLN: 10.0000 mg | Freq: Once | INTRAVENOUS | Status: DC

## 2016-02-07 MED ORDER — SODIUM CHLORIDE 0.9 % IV SOLN
Freq: Once | INTRAVENOUS | Status: AC
  Administered 2016-02-07: 15:00:00 via INTRAVENOUS

## 2016-02-07 MED ORDER — SODIUM CHLORIDE 0.9 % IV SOLN
750.0000 mg | Freq: Once | INTRAVENOUS | Status: AC
  Administered 2016-02-07: 750 mg via INTRAVENOUS
  Filled 2016-02-07: qty 75

## 2016-02-07 MED ORDER — PALONOSETRON HCL INJECTION 0.25 MG/5ML
INTRAVENOUS | Status: AC
  Filled 2016-02-07: qty 5

## 2016-02-07 MED ORDER — SODIUM CHLORIDE 0.9% FLUSH
10.0000 mL | INTRAVENOUS | Status: DC | PRN
  Administered 2016-02-07: 10 mL
  Filled 2016-02-07: qty 10

## 2016-02-07 MED ORDER — PEGFILGRASTIM 6 MG/0.6ML ~~LOC~~ PSKT
6.0000 mg | PREFILLED_SYRINGE | Freq: Once | SUBCUTANEOUS | Status: AC
  Administered 2016-02-07: 6 mg via SUBCUTANEOUS
  Filled 2016-02-07: qty 0.6

## 2016-02-07 MED ORDER — CARBOPLATIN CHEMO INTRADERMAL TEST DOSE 100MCG/0.02ML
100.0000 ug | Freq: Once | INTRADERMAL | Status: AC
  Administered 2016-02-07: 100 ug via INTRADERMAL
  Filled 2016-02-07: qty 0.01

## 2016-02-07 MED ORDER — SODIUM CHLORIDE 0.9 % IV SOLN: Freq: Once | INTRAVENOUS | Status: DC

## 2016-02-07 MED ORDER — PALONOSETRON HCL INJECTION 0.25 MG/5ML
0.2500 mg | Freq: Once | INTRAVENOUS | Status: AC
  Administered 2016-02-07: 0.25 mg via INTRAVENOUS

## 2016-02-07 MED ORDER — HEPARIN SOD (PORK) LOCK FLUSH 100 UNIT/ML IV SOLN
500.0000 [IU] | Freq: Once | INTRAVENOUS | Status: AC | PRN
  Administered 2016-02-07: 500 [IU]
  Filled 2016-02-07: qty 5

## 2016-02-07 MED ORDER — SODIUM CHLORIDE 0.9 % IV SOLN
Freq: Once | INTRAVENOUS | Status: AC
  Administered 2016-02-07: 16:00:00 via INTRAVENOUS
  Filled 2016-02-07: qty 5

## 2016-02-07 MED ORDER — DEXTROSE 5 % IV SOLN
60.0000 mg/m2 | Freq: Once | INTRAVENOUS | Status: AC
  Administered 2016-02-07: 140 mg via INTRAVENOUS
  Filled 2016-02-07: qty 14

## 2016-02-07 NOTE — Patient Instructions (Signed)
Docetaxel injection  What is this medicine?  DOCETAXEL (doe se TAX el) is a chemotherapy drug. It targets fast dividing cells, like cancer cells, and causes these cells to die. This medicine is used to treat many types of cancers like breast cancer, certain stomach cancers, head and neck cancer, lung cancer, and prostate cancer.  This medicine may be used for other purposes; ask your health care provider or pharmacist if you have questions.  What should I tell my health care provider before I take this medicine?  They need to know if you have any of these conditions:  -infection (especially a virus infection such as chickenpox, cold sores, or herpes)  -liver disease  -low blood counts, like low white cell, platelet, or red cell counts  -an unusual or allergic reaction to docetaxel, polysorbate 80, other chemotherapy agents, other medicines, foods, dyes, or preservatives  -pregnant or trying to get pregnant  -breast-feeding  How should I use this medicine?  This drug is given as an infusion into a vein. It is administered in a hospital or clinic by a specially trained health care professional.  Talk to your pediatrician regarding the use of this medicine in children. Special care may be needed.  Overdosage: If you think you have taken too much of this medicine contact a poison control center or emergency room at once.  NOTE: This medicine is only for you. Do not share this medicine with others.  What if I miss a dose?  It is important not to miss your dose. Call your doctor or health care professional if you are unable to keep an appointment.  What may interact with this medicine?  -cyclosporine  -erythromycin  -ketoconazole  -medicines to increase blood counts like filgrastim, pegfilgrastim, sargramostim  -vaccines  Talk to your doctor or health care professional before taking any of these medicines:  -acetaminophen  -aspirin  -ibuprofen  -ketoprofen  -naproxen  This list may not describe all possible interactions.  Give your health care provider a list of all the medicines, herbs, non-prescription drugs, or dietary supplements you use. Also tell them if you smoke, drink alcohol, or use illegal drugs. Some items may interact with your medicine.  What should I watch for while using this medicine?  Your condition will be monitored carefully while you are receiving this medicine. You will need important blood work done while you are taking this medicine.  This drug may make you feel generally unwell. This is not uncommon, as chemotherapy can affect healthy cells as well as cancer cells. Report any side effects. Continue your course of treatment even though you feel ill unless your doctor tells you to stop.  In some cases, you may be given additional medicines to help with side effects. Follow all directions for their use.  Call your doctor or health care professional for advice if you get a fever, chills or sore throat, or other symptoms of a cold or flu. Do not treat yourself. This drug decreases your body's ability to fight infections. Try to avoid being around people who are sick.  This medicine may increase your risk to bruise or bleed. Call your doctor or health care professional if you notice any unusual bleeding.  This medicine may contain alcohol in the product. You may get drowsy or dizzy. Do not drive, use machinery, or do anything that needs mental alertness until you know how this medicine affects you. Do not stand or sit up quickly, especially if you are an older   There is a potential for serious side effects to an unborn child. Talk to your health care professional or pharmacist for more information. Do not breast-feed an infant while taking this medicine. What side effects may I notice  from receiving this medicine? Side effects that you should report to your doctor or health care professional as soon as possible: -allergic reactions like skin rash, itching or hives, swelling of the face, lips, or tongue -low blood counts - This drug may decrease the number of white blood cells, red blood cells and platelets. You may be at increased risk for infections and bleeding. -signs of infection - fever or chills, cough, sore throat, pain or difficulty passing urine -signs of decreased platelets or bleeding - bruising, pinpoint red spots on the skin, black, tarry stools, nosebleeds -signs of decreased red blood cells - unusually weak or tired, fainting spells, lightheadedness -breathing problems -fast or irregular heartbeat -low blood pressure -mouth sores -nausea and vomiting -pain, swelling, redness or irritation at the injection site -pain, tingling, numbness in the hands or feet -swelling of the ankle, feet, hands -weight gain Side effects that usually do not require medical attention (report to your prescriber or health care professional if they continue or are bothersome): -bone pain -complete hair loss including hair on your head, underarms, pubic hair, eyebrows, and eyelashes -diarrhea -excessive tearing -changes in the color of fingernails -loosening of the fingernails -nausea -muscle pain -red flush to skin -sweating -weak or tired This list may not describe all possible side effects. Call your doctor for medical advice about side effects. You may report side effects to FDA at 1-800-FDA-1088. Where should I keep my medicine? This drug is given in a hospital or clinic and will not be stored at home. NOTE: This sheet is a summary. It may not cover all possible information. If you have questions about this medicine, talk to your doctor, pharmacist, or health care provider.    2016, Elsevier/Gold Standard. (2015-01-03 16:04:57)   [facility] Discharge  Instructions for Patients Receiving Chemotherapy  Today you received the following chemotherapy agents Taxotere and Carboplatin. To help prevent nausea and vomiting after your treatment, we encourage you to take your nausea medication as directed. - DO NOT TAKE ZOFRAN FOR 3 DAYS.  If you develop nausea and vomiting that is not controlled by your nausea medication, call the clinic.   BELOW ARE SYMPTOMS THAT SHOULD BE REPORTED IMMEDIATELY:  *FEVER GREATER THAN 100.5 F  *CHILLS WITH OR WITHOUT FEVER  NAUSEA AND VOMITING THAT IS NOT CONTROLLED WITH YOUR NAUSEA MEDICATION  *UNUSUAL SHORTNESS OF BREATH  *UNUSUAL BRUISING OR BLEEDING  TENDERNESS IN MOUTH AND THROAT WITH OR WITHOUT PRESENCE OF ULCERS  *URINARY PROBLEMS  *BOWEL PROBLEMS  UNUSUAL RASH Items with * indicate a potential emergency and should be followed up as soon as possible.  Feel free to call the clinic you have any questions or concerns. The clinic phone number is [phone].  Please show the [place] at check-in to the Emergency Department and triage nurse.

## 2016-02-08 ENCOUNTER — Telehealth: Payer: Self-pay

## 2016-02-08 NOTE — Telephone Encounter (Signed)
-----   Message from Herschell Dimes, RN sent at 02/07/2016  4:58 PM EST ----- Regarding: LIVESAY first time follow up Taxotere Contact: [phone] First time Taxotere today- Dr. Marko Plume pt. Phone # [phone]

## 2016-02-08 NOTE — Telephone Encounter (Signed)
Sheena Miles states that she feels fine from her treatment yesterday.  No NV. Has moved bowels last evening and this am. Drinking fluids well.  She knows to call [facility] at [phone]  If she has any concerns or issues prior to visit 02-13-16 with Dr. Marko Plume.

## 2016-02-09 ENCOUNTER — Ambulatory Visit: Payer: BC Managed Care – PPO

## 2016-02-09 ENCOUNTER — Other Ambulatory Visit: Payer: Self-pay | Admitting: Oncology

## 2016-02-09 DIAGNOSIS — C569 Malignant neoplasm of unspecified ovary: Secondary | ICD-10-CM

## 2016-02-11 ENCOUNTER — Other Ambulatory Visit: Payer: Self-pay | Admitting: Oncology

## 2016-02-11 DIAGNOSIS — C569 Malignant neoplasm of unspecified ovary: Secondary | ICD-10-CM

## 2016-02-13 ENCOUNTER — Ambulatory Visit (HOSPITAL_BASED_OUTPATIENT_CLINIC_OR_DEPARTMENT_OTHER): Payer: BC Managed Care – PPO | Admitting: Oncology

## 2016-02-13 ENCOUNTER — Other Ambulatory Visit (HOSPITAL_BASED_OUTPATIENT_CLINIC_OR_DEPARTMENT_OTHER): Payer: BC Managed Care – PPO

## 2016-02-13 ENCOUNTER — Encounter: Payer: Self-pay | Admitting: Oncology

## 2016-02-13 VITALS — BP 111/79 | HR 100 | Temp 98.3°F | Resp 18 | Ht 65.0 in | Wt 254.5 lb

## 2016-02-13 DIAGNOSIS — C561 Malignant neoplasm of right ovary: Secondary | ICD-10-CM

## 2016-02-13 DIAGNOSIS — C562 Malignant neoplasm of left ovary: Secondary | ICD-10-CM | POA: Diagnosis not present

## 2016-02-13 DIAGNOSIS — D701 Agranulocytosis secondary to cancer chemotherapy: Secondary | ICD-10-CM

## 2016-02-13 DIAGNOSIS — T451X5A Adverse effect of antineoplastic and immunosuppressive drugs, initial encounter: Secondary | ICD-10-CM

## 2016-02-13 DIAGNOSIS — C569 Malignant neoplasm of unspecified ovary: Secondary | ICD-10-CM

## 2016-02-13 DIAGNOSIS — Z23 Encounter for immunization: Secondary | ICD-10-CM

## 2016-02-13 DIAGNOSIS — Z95828 Presence of other vascular implants and grafts: Secondary | ICD-10-CM

## 2016-02-13 DIAGNOSIS — R112 Nausea with vomiting, unspecified: Secondary | ICD-10-CM

## 2016-02-13 LAB — CBC WITH DIFFERENTIAL/PLATELET
BASO%: 0.6 % (ref 0.0–2.0)
Basophils Absolute: 0 10*3/uL (ref 0.0–0.1)
EOS%: 0.8 % (ref 0.0–7.0)
Eosinophils Absolute: 0 10*3/uL (ref 0.0–0.5)
HEMATOCRIT: 37.3 % (ref 34.8–46.6)
HEMOGLOBIN: 12.6 g/dL (ref 11.6–15.9)
LYMPH#: 1.7 10*3/uL (ref 0.9–3.3)
LYMPH%: 34.3 % (ref 14.0–49.7)
MCH: 31.6 pg (ref 25.1–34.0)
MCHC: 33.9 g/dL (ref 31.5–36.0)
MCV: 93.2 fL (ref 79.5–101.0)
MONO#: 0.5 10*3/uL (ref 0.1–0.9)
MONO%: 10.7 % (ref 0.0–14.0)
NEUT%: 53.6 % (ref 38.4–76.8)
NEUTROS ABS: 2.6 10*3/uL (ref 1.5–6.5)
PLATELETS: 215 10*3/uL (ref 145–400)
RBC: 4 10*6/uL (ref 3.70–5.45)
RDW: 14.9 % — AB (ref 11.2–14.5)
WBC: 4.9 10*3/uL (ref 3.9–10.3)

## 2016-02-13 LAB — COMPREHENSIVE METABOLIC PANEL
ALT: 20 U/L (ref 0–55)
ANION GAP: 11 meq/L (ref 3–11)
AST: 17 U/L (ref 5–34)
Albumin: 3.9 g/dL (ref 3.5–5.0)
Alkaline Phosphatase: 99 U/L (ref 40–150)
BILIRUBIN TOTAL: 0.34 mg/dL (ref 0.20–1.20)
BUN: 12.9 mg/dL (ref 7.0–26.0)
CALCIUM: 9.9 mg/dL (ref 8.4–10.4)
CHLORIDE: 103 meq/L (ref 98–109)
CO2: 26 mEq/L (ref 22–29)
CREATININE: 0.7 mg/dL (ref 0.6–1.1)
EGFR: >90 mL/min/{1.73_m2} (ref >90)
Glucose: 83 mg/dl (ref 70–140)
Potassium: 4.6 mEq/L (ref 3.5–5.1)
Sodium: 140 mEq/L (ref 136–145)
TOTAL PROTEIN: 6.9 g/dL (ref 6.4–8.3)

## 2016-02-13 MED ORDER — PROCHLORPERAZINE MALEATE 10 MG PO TABS: 10.0000 mg | ORAL_TABLET | Freq: Four times a day (QID) | ORAL | Status: AC | PRN

## 2016-02-13 MED ORDER — NYSTATIN 100000 UNIT/GM EX POWD
CUTANEOUS | Status: AC
Start: 2016-02-13 — End: ?

## 2016-02-13 NOTE — Progress Notes (Signed)
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 Since suboptimal debulking surgery 09-06-15, she has had gradual response to carboplatin taxol, tho persistent disease by CT CAP in 01-2016 after 6 cycles. Plan is to continue with carboplatin taxotere in attempt to maximize response. She had first cycle of the carbo taxotere on 02-07-16, with neulasta by on pro injector. Carbo dose, which had been by actual weight for initial 6 chemotherapy cycles, was capped at 750 mg for first combination with taxotere.   Patient reports more nausea with most recent treatment, despite same aloxi and EMEND with premeds. She had some constipation, resolved promptly with additional miralax, bowels now moving well. She reports improvement in peripheral neuropathy in fingers and feet, gabapentin at hs helping. She has had no bleeding, no fever or symptoms of infection. She reports aching even with change in taxane. She has had soreness in mid upper back, seems positional, better with ibuprofen. She is fatigued and notices decreased exercise tolerance progressively. She denies bladder symptoms or LE swelling.  Remainder of 10 point Review of Systems negative.    Power PAC placed at [facility] 09-09-15 Flu vaccine 10-27-15 Genetics testing 10-27-15 normal ([facility] panel). 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   She plans to go on trip to [place] with students 2-17 thru 02-20-16.  ONCOLOGIC HISTORY Patient initially developed constipation and abdominal pain summer 2016 which she thought was stress related, then worsening abdominal and pelvic pain, some back pain and some fever; she had weight gain ~ 30 lbs in prior 5-6 months. She was seen at  [facility] with concern for acute appendicitis. She was evaluated at local ED, I believe [facility], with CT reportedly showing large left and small right pleural effusions, moderate ascites, complex septated cystic mass in low pelvis into left abdomen 13 x 7 x 7 cm, adenopathy Including large diaphragmatic lymph nodes anterior to heart and numerous mesenteric nodes, and apparent carcinomatosis with implants in omentum up to 3.8 cm. She was seen by Dr [name] and referred to Dr Alycia Rossetti. At Dr Elenora Gamma exam 08-31-15 there was large mass in cul de sac and abdominal fluid wave, decreased BS left lower 1/2 of chest. Lab studies included CA 125 33,145; inhibin B <10; CEA <0.5, AFP 2.2, LDH 1281 and HCG negative. Surgery by Dr Alycia Rossetti at [facility] on 09-06-15 was exploratory laparotomy with supracervical hysterectomy, BSO, omentectomy, resection with primary reanastomosis of rectosigmoid and peritoneal stripping. Intraoperative findings included 3 liters of bloody ascites, 12 cm omental mass adherent to uterus, sigmoid colon and rectum which was resected en bloc, bladder nodule resected. At completion of surgery there was residual tumor along entirety of small bowel mesentery with multiple nodules up to 1.5 cm, miliary disease on diaphragm, and peritoneal nodularity along pelvis and rectum (R2 resection). Pathology [facility] [phone] high grade serous carcinoma of bilateral ovaries involving bilateral tubes, uterine wall anterior and posterior and lower uterine segment, omentum, colon. No nodes submitted.  She was transfused 2 units PRBCs on each 9-9 and 09-13-15 for hemoglobin as low as 6.8 - 7.1. She had urgent right thoracentesis for 750 cc on 09-08-15 with cytology positive for high grade serous carcinoma ([id number]) and left thoracentesis for 400  cc on 09-09-15. CT angio chest on POD 1 was negative for PE. She tolerated morphine by PCA post operatively. She had mild cellulitis at abdominal incision on day  of DC, begun on Keflex then. Psychiatry saw in hospital, begun on zoloft. She was discharged home with 28 days of lovenox. She saw Dr Alycia Rossetti on 09-19-15 with staples removed, however wound opened that evening. She was seen by gyn onc provider on 09-23-15, with wound open 3 x 5 cm, 3 cm deep, no evidence of infection. Wet to dry dressings bid were begun, ongoing. She had follow up wound check on 09-27-15, no findings of concern. GOG protocol at [facility] had delay, so that treatment given off protocol with first cycle of carboplatin taxol at [facility] on 09-26-15. She had additional 5 cycles of carboplatin taxol at [facility] thru 12-19-15. Restaging CT CAP showed no diffuse peritoneal disease remaining, small ascites and small right pleural effusion, thickened retrocecal appendix, right external iliac node 0.7 cm.  .  Objective:  Vital signs in last 24 hours:  BP 111/79 mmHg  Pulse 100  Temp(Src) 98.3 F (36.8 C) (Oral)  Resp 18  Ht 5' 5"  (1.651 m)  Wt 254 lb 8 oz (115.44 kg)  BMI 42.35 kg/m2  SpO2 100% Weight up 1 lb Alert, oriented and appropriate. Ambulatory without assistance.  Alopecia  HEENT:PERRL, sclerae not icteric. Oral mucosa moist without lesions, posterior pharynx clear.  Neck supple. No JVD.  Lymphatics:no cervical,supraclavicular or inguinal adenopathy Resp: clear to auscultation bilaterally and normal percussion bilaterally Cardio: regular rate and rhythm. No gallop. GI: abdomen obese, soft, nontender, not distended, cannot appreciate mass or organomegaly. Normally active bowel sounds. Surgical incision closed. Skin with yeast rash in deep tissue fold inferiorly Musculoskeletal/ Extremities: without pitting edema, cords, tenderness. Spine not point tender at upper back. Neuro: mild peripheral neuropathy as noted fingers and feet. Otherwise nonfocal. PSYCH appropriate mood and affect Skin without ecchymosis, petechiae Portacath-without erythema or tenderness  Lab Results:  Results for  orders placed or performed in visit on 02/13/16  CBC with Differential  Result Value Ref Range   WBC 4.9 3.9 - 10.3 10e3/uL   NEUT# 2.6 1.5 - 6.5 10e3/uL   HGB 12.6 11.6 - 15.9 g/dL   HCT 37.3 34.8 - 46.6 %   Platelets 215 145 - 400 10e3/uL   MCV 93.2 79.5 - 101.0 fL   MCH 31.6 25.1 - 34.0 pg   MCHC 33.9 31.5 - 36.0 g/dL   RBC 4.00 3.70 - 5.45 10e6/uL   RDW 14.9 (H) 11.2 - 14.5 %   lymph# 1.7 0.9 - 3.3 10e3/uL   MONO# 0.5 0.1 - 0.9 10e3/uL   Eosinophils Absolute 0.0 0.0 - 0.5 10e3/uL   Basophils Absolute 0.0 0.0 - 0.1 10e3/uL   NEUT% 53.6 38.4 - 76.8 %   LYMPH% 34.3 14.0 - 49.7 %   MONO% 10.7 0.0 - 14.0 %   EOS% 0.8 0.0 - 7.0 %   BASO% 0.6 0.0 - 2.0 %  Comprehensive metabolic panel  Result Value Ref Range   Sodium 140 136 - 145 mEq/L   Potassium 4.6 3.5 - 5.1 mEq/L   Chloride 103 98 - 109 mEq/L   CO2 26 22 - 29 mEq/L   Glucose 83 70 - 140 mg/dl   BUN 12.9 7.0 - 26.0 mg/dL   Creatinine 0.7 0.6 - 1.1 mg/dL   Total Bilirubin 0.34 0.20 - 1.20 mg/dL   Alkaline Phosphatase 99 40 - 150 U/L  AST 17 5 - 34 U/L   ALT 20 0 - 55 U/L   Total Protein 6.9 6.4 - 8.3 g/dL   Albumin 3.9 3.5 - 5.0 g/dL   Calcium 9.9 8.4 - 10.4 mg/dL   Anion Gap 11 3 - 11 mEq/L   EGFR >90 >90 ml/min/1.73 m2   CA 125 02-02-16 by new lab method 57 and by prior lab method 55, this having been 83 and 79 on 01-16-16 and 144 in 12-2015 by prior method.  Studies/Results:  No results found.  Medications: I have reviewed the patient's current medications. Mycostatin powder to cutaneous yeast rash in deep skin folds bid after careful drying. Refill compazine Continue gabapentin and miralax  DISCUSSION Reviewed most recent CT chest in reference to mid upper back soreness, nothing obvious. If persists can get additional imaging, but for now would use ibuprofen with food, massage, avoid positions that aggravate Patient has contacts and plans if concerns on trip. She will let us know if significant fatigue prior,  however counts are still good now.   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 Malignant pleural fluid and ascites at presentation. Improvement in CA 125 tho this has not normalized, and improvement but some persistent disease by CT CAP after 6 cycles carbo taxol. First carboplatin (+skin testing) and taxotere given 02-07-16, recommendation from Dr Alycia Rossetti to use "until CA 125 normalizes". Expect may want PET CT when next imaging. Genetics testing negative; Foundation One sent from [facility] on surgical path as above, not eligible for related trials now but may be option in future. 2.surgical wound healed following initial dehiscence. 3.surgical menopause with ongoing hot flashes. 4.flu vaccine 10-10-15 5.power PAC  6.chemo nausea: requires EMEND and Aloxi, difficult to control even with this. 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 8.chemo neuropathy: taxane changed to taxotere. On gabapentin, Follow 9.mild chemo anemia, iron studies ok 09-30-15. Improved on supplemental ferrous fumarate, continue 10.chemo induced neutropenia with carbo taxol, received neulasta as on pro injector then. Using neulasta as on pro with taxotere 11.obesity: BMI 42  All questions answered and she knows to call if concerns. I will see her again with labs on 2-23, due cycle 2 carbo taxotere on 02-27-16.     Zamir Staples P, MD   02/13/2016, 1:18 PM

## 2016-02-19 ENCOUNTER — Other Ambulatory Visit: Payer: Self-pay | Admitting: Oncology

## 2016-02-21 ENCOUNTER — Telehealth: Payer: Self-pay | Admitting: Oncology

## 2016-02-21 NOTE — Telephone Encounter (Signed)
appt made per LL 2/19 pof. Pt will be informed on 2/23 at visit with LL per pof

## 2016-02-23 ENCOUNTER — Ambulatory Visit (HOSPITAL_BASED_OUTPATIENT_CLINIC_OR_DEPARTMENT_OTHER): Payer: BC Managed Care – PPO | Admitting: Oncology

## 2016-02-23 ENCOUNTER — Other Ambulatory Visit (HOSPITAL_BASED_OUTPATIENT_CLINIC_OR_DEPARTMENT_OTHER): Payer: BC Managed Care – PPO

## 2016-02-23 ENCOUNTER — Encounter: Payer: Self-pay | Admitting: Oncology

## 2016-02-23 VITALS — BP 111/69 | HR 99 | Temp 97.9°F | Resp 18 | Ht 65.0 in | Wt 253.2 lb

## 2016-02-23 DIAGNOSIS — G620 Drug-induced polyneuropathy: Secondary | ICD-10-CM | POA: Diagnosis not present

## 2016-02-23 DIAGNOSIS — C562 Malignant neoplasm of left ovary: Secondary | ICD-10-CM | POA: Diagnosis not present

## 2016-02-23 DIAGNOSIS — C569 Malignant neoplasm of unspecified ovary: Secondary | ICD-10-CM

## 2016-02-23 DIAGNOSIS — T451X5A Adverse effect of antineoplastic and immunosuppressive drugs, initial encounter: Secondary | ICD-10-CM

## 2016-02-23 DIAGNOSIS — Z95828 Presence of other vascular implants and grafts: Secondary | ICD-10-CM

## 2016-02-23 DIAGNOSIS — C561 Malignant neoplasm of right ovary: Secondary | ICD-10-CM

## 2016-02-23 DIAGNOSIS — D701 Agranulocytosis secondary to cancer chemotherapy: Secondary | ICD-10-CM

## 2016-02-23 DIAGNOSIS — D6481 Anemia due to antineoplastic chemotherapy: Secondary | ICD-10-CM

## 2016-02-23 DIAGNOSIS — R112 Nausea with vomiting, unspecified: Secondary | ICD-10-CM

## 2016-02-23 LAB — COMPREHENSIVE METABOLIC PANEL
ALT: 24 U/L (ref 0–55)
ANION GAP: 10 meq/L (ref 3–11)
AST: 17 U/L (ref 5–34)
Albumin: 4 g/dL (ref 3.5–5.0)
Alkaline Phosphatase: 91 U/L (ref 40–150)
BUN: 10.7 mg/dL (ref 7.0–26.0)
CALCIUM: 9.8 mg/dL (ref 8.4–10.4)
CHLORIDE: 107 meq/L (ref 98–109)
CO2: 24 meq/L (ref 22–29)
CREATININE: 0.7 mg/dL (ref 0.6–1.1)
Glucose: 84 mg/dl (ref 70–140)
POTASSIUM: 4 meq/L (ref 3.5–5.1)
Sodium: 141 mEq/L (ref 136–145)
Total Bilirubin: 0.37 mg/dL (ref 0.20–1.20)
Total Protein: 6.9 g/dL (ref 6.4–8.3)

## 2016-02-23 LAB — CBC WITH DIFFERENTIAL/PLATELET
BASO%: 0.9 % (ref 0.0–2.0)
Basophils Absolute: 0.1 10*3/uL (ref 0.0–0.1)
EOS%: 0.1 % (ref 0.0–7.0)
Eosinophils Absolute: 0 10*3/uL (ref 0.0–0.5)
HEMATOCRIT: 35.6 % (ref 34.8–46.6)
HGB: 12 g/dL (ref 11.6–15.9)
LYMPH%: 16.3 % (ref 14.0–49.7)
MCH: 31.5 pg (ref 25.1–34.0)
MCHC: 33.8 g/dL (ref 31.5–36.0)
MCV: 93.3 fL (ref 79.5–101.0)
MONO#: 0.6 10*3/uL (ref 0.1–0.9)
MONO%: 6.8 % (ref 0.0–14.0)
NEUT#: 6.4 10*3/uL (ref 1.5–6.5)
NEUT%: 75.9 % (ref 38.4–76.8)
PLATELETS: 213 10*3/uL (ref 145–400)
RBC: 3.81 10*6/uL (ref 3.70–5.45)
RDW: 15 % — AB (ref 11.2–14.5)
WBC: 8.4 10*3/uL (ref 3.9–10.3)
lymph#: 1.4 10*3/uL (ref 0.9–3.3)

## 2016-02-23 MED ORDER — LORAZEPAM 1 MG PO TABS: 1.0000 mg | ORAL_TABLET | Freq: Three times a day (TID) | ORAL | Status: DC

## 2016-02-23 NOTE — Progress Notes (Signed)
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 She had persistent disease by CT CAP in 01-2016 after suboptimal resection and 6 cycles carbo taxol. With chemo related peripheral neuropathy, regimen was changed to [place] taxotere beginnng 02-07-16, with on pro neulasta.  Patient tolerated first carbo taxotere probably somewhat better than carbo taxol, and counts maintained with the neulasta. She was able to go with students to [place] last week, which she loved. Peripheral neuropathy symptoms in hands and feet are only intermittently noticeable now, gabapentin still useful. She has slight congestion in AMs likely post nasal drainage with environmental allergies, is using claritin. Appetite is adequate, bowels moving regularly, increased hot flashes in [place]. No fever or symptoms of infection. No new or different pain No bleeding  PAC ok. Bladder ok. Remainder of 10 point Review of Systems negative/ unchanged.     Power PAC placed at [facility] 09-09-15 Flu vaccine 10-27-15 Genetics testing 10-27-15 normal ([facility] panel). 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  ONCOLOGIC HISTORY Patient initially developed constipation and abdominal pain summer 2016 which she thought was stress related, then worsening abdominal and pelvic pain, some back pain and some fever; she had weight gain ~ 30 lbs in prior 5-6 months. She was seen at [facility] with concern for acute appendicitis. She was evaluated at local ED, I believe [facility], with CT reportedly showing large left and small right pleural effusions, moderate ascites, complex septated cystic mass in low pelvis into left abdomen 13 x 7 x 7 cm, adenopathy Including large diaphragmatic lymph  nodes anterior to heart and numerous mesenteric nodes, and apparent carcinomatosis with implants in omentum up to 3.8 cm. She was seen by Dr [name] and referred to Dr Alycia Rossetti. At Dr Elenora Gamma exam 08-31-15 there was large mass in cul de sac and abdominal fluid wave, decreased BS left lower 1/2 of chest. Lab studies included CA 125 33,145; inhibin B <10; CEA <0.5, AFP 2.2, LDH 1281 and HCG negative. Surgery by Dr Alycia Rossetti at [facility] on 09-06-15 was exploratory laparotomy with supracervical hysterectomy, BSO, omentectomy, resection with primary reanastomosis of rectosigmoid and peritoneal stripping. Intraoperative findings included 3 liters of bloody ascites, 12 cm omental mass adherent to uterus, sigmoid colon and rectum which was resected en bloc, bladder nodule resected. At completion of surgery there was residual tumor along entirety of small bowel mesentery with multiple nodules up to 1.5 cm, miliary disease on diaphragm, and peritoneal nodularity along pelvis and rectum (R2 resection). Pathology [facility] [phone] high grade serous carcinoma of bilateral ovaries involving bilateral tubes, uterine wall anterior and posterior and lower uterine segment, omentum, colon. No nodes submitted.  She was transfused 2 units PRBCs on each 9-9 and 09-13-15 for hemoglobin as low as 6.8 - 7.1. She had urgent right thoracentesis for 750 cc on 09-08-15 with cytology positive for high grade serous carcinoma ([id number]) and left thoracentesis for 400 cc on 09-09-15. CT angio chest on POD 1 was negative for PE. She tolerated morphine by PCA post operatively. She had mild cellulitis at abdominal incision on day of DC, begun on Keflex then. Psychiatry saw in hospital, begun on zoloft. She was discharged home with 28 days of lovenox. She saw Dr Alycia Rossetti on 09-19-15 with staples  removed, however wound opened that evening. She was seen by gyn onc provider on 09-23-15, with wound open 3 x 5 cm, 3 cm deep, no evidence of infection. Wet to dry  dressings bid were begun, ongoing. She had follow up wound check on 09-27-15, no findings of concern. GOG protocol at [facility] had delay, so that treatment given off protocol with first cycle of carboplatin taxol at [facility] on 09-26-15. She had additional 5 cycles of carboplatin taxol at [facility] thru 12-19-15. Restaging CT CAP showed no diffuse peritoneal disease remaining, small ascites and small right pleural effusion, thickened retrocecal appendix, right external iliac node 0.7 cm.   Objective:  Vital signs in last 24 hours:  BP 111/69 mmHg  Pulse 99  Temp(Src) 97.9 F (36.6 C) (Oral)  Resp 18  Ht _0  (1.651 m)  Wt 253 lb 3.2 oz (114.851 kg)  BMI 42.13 kg/m2  SpO2 100%  Weight up 1 lb  Alert, oriented and appropriate. Ambulatory without difficulty, looks generally comfortable, talkative and in good spirits.  Alopecia  HEENT:PERRL, sclerae not icteric. Oral mucosa moist without lesions, posterior pharynx clear.  Neck supple. No JVD.  Lymphatics:no cervical,supraclavicular, axillary or inguinal adenopathy Resp: clear to auscultation bilaterally and normal percussion bilaterally Cardio: regular rate and rhythm. No gallop. GI: abdomen obese, soft, nontender, not distended, no appreciable mass or organomegaly. Normally active bowel sounds. Surgical incision well healed, no skin irritation now Musculoskeletal/ Extremities: without pitting edema, cords, tenderness Neuro: improved peripheral neuropathy. Otherwise nonfocal. PSYCH appropriate mood and affect Skin without rash, ecchymosis, petechiae Portacath-without erythema or tenderness  Lab Results:  Results for orders placed or performed in visit on 02/23/16  CBC with Differential  Result Value Ref Range   WBC 8.4 3.9 - 10.3 10e3/uL   NEUT# 6.4 1.5 - 6.5 10e3/uL   HGB 12.0 11.6 - 15.9 g/dL   HCT 35.6 34.8 - 46.6 %   Platelets 213 145 - 400 10e3/uL   MCV 93.3 79.5 - 101.0 fL   MCH 31.5 25.1 - 34.0 pg   MCHC 33.8 31.5 - 36.0 g/dL   RBC  3.81 3.70 - 5.45 10e6/uL   RDW 15.0 (H) 11.2 - 14.5 %   lymph# 1.4 0.9 - 3.3 10e3/uL   MONO# 0.6 0.1 - 0.9 10e3/uL   Eosinophils Absolute 0.0 0.0 - 0.5 10e3/uL   Basophils Absolute 0.1 0.0 - 0.1 10e3/uL   NEUT% 75.9 38.4 - 76.8 %   LYMPH% 16.3 14.0 - 49.7 %   MONO% 6.8 0.0 - 14.0 %   EOS% 0.1 0.0 - 7.0 %   BASO% 0.9 0.0 - 2.0 %  Comprehensive metabolic panel  Result Value Ref Range   Sodium 141 136 - 145 mEq/L   Potassium 4.0 3.5 - 5.1 mEq/L   Chloride 107 98 - 109 mEq/L   CO2 24 22 - 29 mEq/L   Glucose 84 70 - 140 mg/dl   BUN 10.7 7.0 - 26.0 mg/dL   Creatinine 0.7 0.6 - 1.1 mg/dL   Total Bilirubin 0.37 0.20 - 1.20 mg/dL   Alkaline Phosphatase 91 40 - 150 U/L   AST 17 5 - 34 U/L   ALT 24 0 - 55 U/L   Total Protein 6.9 6.4 - 8.3 g/dL   Albumin 4.0 3.5 - 5.0 g/dL   Calcium 9.8 8.4 - 10.4 mg/dL   Anion Gap 10 3 - 11 mEq/L   EGFR >90 >90 ml/min/1.73 m2  CA 125  Result Value Ref Range  Cancer Antigen (CA) 125 74.3 (H) 0.0 - 38.1 U/mL  CA 125 (Parallel Testing)  Result Value Ref Range   CA 125 68 (H) <35 U/mL   CA 125 results available following visit. CA 125 comparison with 74 value above was 57 on 02-02-16 and 83 on 01-16-16 (and by "parallel method" was 55 on 02-02-16, 79 on 01-16-16 and 144 on 12-19-15)   Studies/Results:  No results found.  Medications: I have reviewed the patient's current medications.  DISCUSSION Patient is in agreement with continuing treatment with [place] and taxotere. She understands that marker results will be available when she comes for treatment on 2-27. Will need to let her know then that we will recheck marker again after upcoming cycle 2 carbo taxotere, as there can be some slight variation in the marker at times.  Premedicate taxotere with decadron bid x 3 days beginning day prior to taxotere; carbo skin test due to # carbo treatments thus far; on pro neulasta. If she is unable to keep down sufficient fluids after chemo she will need  IVF.  Will use labs from 2-23 for chemo 02-27-16.   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 Malignant pleural fluid and ascites at presentation. Improvement in CA 125 tho this has not normalized, and improvement but some persistent disease by CT CAP after 6 cycles carbo taxol. First carboplatin (+skin testing) and taxotere given 02-07-16, recommendation from Dr Alycia Rossetti to use "until CA 125 normalizes". Expect may want PET CT when next imaging. Cycle 2 carbo taxotere 02-27-16 Genetics testing negative; Foundation One sent from [facility] on surgical path as above, not eligible for related trials now but may be option in future. 2.surgical wound healed following initial dehiscence. 3.surgical menopause with ongoing hot flashes. 4.flu vaccine 10-10-15 5.power PAC  6.chemo nausea: requires EMEND and Aloxi, difficult to control even with this. 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 8.chemo neuropathy: taxane changed to taxotere. On gabapentin, Follow 9.mild chemo anemia, iron studies ok 09-30-15. Improved on supplemental ferrous fumarate, continue 10.chemo induced neutropenia with carbo taxol. Using neulasta as on pro with taxotere 11.obesity: BMI 42   All questions answered. Chemo and neulasta orders confirmed. Time spent 25 min including >50% counseling and coordination of care. Cc Drs Alycia Rossetti and [facility] for update   Gordy Levan, MD   02/26/2016, 2:32 PM

## 2016-02-24 LAB — CANCER ANTIGEN 125 (PARALLEL TESTING): CA 125: 68 U/mL — AB (ref <35)

## 2016-02-24 LAB — CA 125: Cancer Antigen (CA) 125: 74.3 U/mL — ABNORMAL HIGH (ref 0.0–38.1)

## 2016-02-26 DIAGNOSIS — G620 Drug-induced polyneuropathy: Secondary | ICD-10-CM | POA: Insufficient documentation

## 2016-02-26 DIAGNOSIS — T451X5A Adverse effect of antineoplastic and immunosuppressive drugs, initial encounter: Secondary | ICD-10-CM

## 2016-02-27 ENCOUNTER — Other Ambulatory Visit: Payer: BC Managed Care – PPO

## 2016-02-27 ENCOUNTER — Other Ambulatory Visit: Payer: Self-pay | Admitting: Oncology

## 2016-02-27 ENCOUNTER — Telehealth: Payer: Self-pay | Admitting: Oncology

## 2016-02-27 ENCOUNTER — Ambulatory Visit (HOSPITAL_BASED_OUTPATIENT_CLINIC_OR_DEPARTMENT_OTHER): Payer: BC Managed Care – PPO

## 2016-02-27 VITALS — BP 127/74 | HR 99 | Temp 98.3°F | Resp 18

## 2016-02-27 DIAGNOSIS — C561 Malignant neoplasm of right ovary: Secondary | ICD-10-CM | POA: Diagnosis not present

## 2016-02-27 DIAGNOSIS — C562 Malignant neoplasm of left ovary: Secondary | ICD-10-CM

## 2016-02-27 DIAGNOSIS — C569 Malignant neoplasm of unspecified ovary: Secondary | ICD-10-CM

## 2016-02-27 DIAGNOSIS — Z5111 Encounter for antineoplastic chemotherapy: Secondary | ICD-10-CM | POA: Diagnosis not present

## 2016-02-27 DIAGNOSIS — D701 Agranulocytosis secondary to cancer chemotherapy: Secondary | ICD-10-CM

## 2016-02-27 MED ORDER — PEGFILGRASTIM 6 MG/0.6ML ~~LOC~~ PSKT
6.0000 mg | PREFILLED_SYRINGE | Freq: Once | SUBCUTANEOUS | Status: AC
  Administered 2016-02-27: 6 mg via SUBCUTANEOUS
  Filled 2016-02-27: qty 0.6

## 2016-02-27 MED ORDER — LORAZEPAM 1 MG PO TABS
ORAL_TABLET | ORAL | Status: AC
  Filled 2016-02-27: qty 1

## 2016-02-27 MED ORDER — COLD PACK MISC ONCOLOGY
1.0000 | Freq: Once | Status: DC | PRN
  Filled 2016-02-27: qty 1

## 2016-02-27 MED ORDER — HEPARIN SOD (PORK) LOCK FLUSH 100 UNIT/ML IV SOLN
500.0000 [IU] | Freq: Once | INTRAVENOUS | Status: AC | PRN
  Administered 2016-02-27: 500 [IU]
  Filled 2016-02-27: qty 5

## 2016-02-27 MED ORDER — PALONOSETRON HCL INJECTION 0.25 MG/5ML
0.2500 mg | Freq: Once | INTRAVENOUS | Status: AC
  Administered 2016-02-27: 0.25 mg via INTRAVENOUS

## 2016-02-27 MED ORDER — LORAZEPAM 1 MG PO TABS
1.0000 mg | ORAL_TABLET | Freq: Once | ORAL | Status: AC
  Administered 2016-02-27: 1 mg via ORAL

## 2016-02-27 MED ORDER — SODIUM CHLORIDE 0.9 % IV SOLN
750.0000 mg | Freq: Once | INTRAVENOUS | Status: AC
  Administered 2016-02-27: 750 mg via INTRAVENOUS
  Filled 2016-02-27: qty 75

## 2016-02-27 MED ORDER — CARBOPLATIN CHEMO INTRADERMAL TEST DOSE 100MCG/0.02ML
100.0000 ug | Freq: Once | INTRADERMAL | Status: AC
  Administered 2016-02-27: 100 ug via INTRADERMAL
  Filled 2016-02-27: qty 0.01

## 2016-02-27 MED ORDER — PALONOSETRON HCL INJECTION 0.25 MG/5ML
INTRAVENOUS | Status: AC
  Filled 2016-02-27: qty 5

## 2016-02-27 MED ORDER — SODIUM CHLORIDE 0.9 % IV SOLN
Freq: Once | INTRAVENOUS | Status: AC
  Administered 2016-02-27: 16:00:00 via INTRAVENOUS
  Filled 2016-02-27: qty 5

## 2016-02-27 MED ORDER — SODIUM CHLORIDE 0.9% FLUSH
10.0000 mL | INTRAVENOUS | Status: DC | PRN
  Administered 2016-02-27: 10 mL
  Filled 2016-02-27: qty 10

## 2016-02-27 MED ORDER — DOCETAXEL CHEMO INJECTION 160 MG/16ML
60.0000 mg/m2 | Freq: Once | INTRAVENOUS | Status: AC
  Administered 2016-02-27: 140 mg via INTRAVENOUS
  Filled 2016-02-27: qty 14

## 2016-02-27 MED ORDER — SODIUM CHLORIDE 0.9 % IV SOLN
Freq: Once | INTRAVENOUS | Status: AC
  Administered 2016-02-27: 15:00:00 via INTRAVENOUS

## 2016-02-27 NOTE — Telephone Encounter (Signed)
Made pt's appt for 3/13. Pt will get AVS in infusion prior to leaving today.

## 2016-02-27 NOTE — Patient Instructions (Signed)
[facility] Discharge Instructions for Patients Receiving Chemotherapy  Today you received the following chemotherapy agents Taxotere/Carboplatin  To help prevent nausea and vomiting after your treatment, we encourage you to take your nausea medication as directed.   If you develop nausea and vomiting that is not controlled by your nausea medication, call the clinic.   BELOW ARE SYMPTOMS THAT SHOULD BE REPORTED IMMEDIATELY:  *FEVER GREATER THAN 100.5 F  *CHILLS WITH OR WITHOUT FEVER  NAUSEA AND VOMITING THAT IS NOT CONTROLLED WITH YOUR NAUSEA MEDICATION  *UNUSUAL SHORTNESS OF BREATH  *UNUSUAL BRUISING OR BLEEDING  TENDERNESS IN MOUTH AND THROAT WITH OR WITHOUT PRESENCE OF ULCERS  *URINARY PROBLEMS  *BOWEL PROBLEMS  UNUSUAL RASH Items with * indicate a potential emergency and should be followed up as soon as possible.  Feel free to call the clinic you have any questions or concerns. The clinic phone number is [phone].  Please show the CHEMO ALERT CARD at check-in to the Emergency Department and triage nurse.    

## 2016-02-29 ENCOUNTER — Ambulatory Visit: Payer: BC Managed Care – PPO

## 2016-03-01 ENCOUNTER — Other Ambulatory Visit: Payer: Self-pay | Admitting: *Deleted

## 2016-03-01 ENCOUNTER — Telehealth: Payer: Self-pay | Admitting: *Deleted

## 2016-03-01 MED ORDER — OSELTAMIVIR PHOSPHATE 75 MG PO CAPS: 75.0000 mg | ORAL_CAPSULE | Freq: Every day | ORAL | Status: DC

## 2016-03-01 NOTE — Telephone Encounter (Signed)
Pt left a message stating she thinks she has been exposed to the flu. Was told to notify Dr Marko Plume of any exposure so she could start Tamiflu.   Attempted to call patient back for more details- LM to call us back.

## 2016-03-01 NOTE — Telephone Encounter (Signed)
tamiflu 75 mg daily x 10 Needs to start asap  thanks

## 2016-03-06 ENCOUNTER — Telehealth: Payer: Self-pay

## 2016-03-06 NOTE — Telephone Encounter (Signed)
Sheena Miles stated that she was at her PCP's office( Dr. Shayne Alken) as she could be seen today. Temp was 99.1 today. No chills. Laryngitis.  Sinus pressure. Productive cough with yellow to clear phlegm.   Chest congestion. She was given Augmentin and an inhaler.  She is to call PCP if symptoms  Worse.

## 2016-03-06 NOTE — Telephone Encounter (Signed)
LM in cell VM that she needs to take ATB on schedule with food.  It can cause diarrhea. Will follow up with her in the morning to see how she is doing.  She may need counts checked at [facility] tomorrow if febrile or feeling worse as she is 1 week out from her chemo treatment per Dr. Marko Plume.

## 2016-03-07 NOTE — Telephone Encounter (Signed)
LM for Sheena Miles to call the office tomorrow and speak with triage nurse or leave message with an up date as to how she is feeling with ATB.. ?any Temp.

## 2016-03-08 ENCOUNTER — Telehealth: Payer: Self-pay

## 2016-03-08 NOTE — Telephone Encounter (Signed)
Pt called w/update on her URI. The augmentin is working. Pt has not had a fever since coming to doctor, minimal sputum, feeling much better, voice still hoarse. Cough still present, albuterol is reducing it.

## 2016-03-11 ENCOUNTER — Other Ambulatory Visit: Payer: Self-pay | Admitting: Oncology

## 2016-03-12 ENCOUNTER — Other Ambulatory Visit (HOSPITAL_BASED_OUTPATIENT_CLINIC_OR_DEPARTMENT_OTHER): Payer: BC Managed Care – PPO

## 2016-03-12 ENCOUNTER — Encounter: Payer: Self-pay | Admitting: Oncology

## 2016-03-12 ENCOUNTER — Ambulatory Visit (HOSPITAL_BASED_OUTPATIENT_CLINIC_OR_DEPARTMENT_OTHER): Payer: BC Managed Care – PPO | Admitting: Oncology

## 2016-03-12 VITALS — BP 105/76 | HR 113 | Temp 98.3°F | Resp 18 | Ht 65.0 in | Wt 254.6 lb

## 2016-03-12 DIAGNOSIS — C562 Malignant neoplasm of left ovary: Secondary | ICD-10-CM

## 2016-03-12 DIAGNOSIS — J019 Acute sinusitis, unspecified: Secondary | ICD-10-CM

## 2016-03-12 DIAGNOSIS — T451X5A Adverse effect of antineoplastic and immunosuppressive drugs, initial encounter: Secondary | ICD-10-CM

## 2016-03-12 DIAGNOSIS — C569 Malignant neoplasm of unspecified ovary: Secondary | ICD-10-CM

## 2016-03-12 DIAGNOSIS — R180 Malignant ascites: Secondary | ICD-10-CM | POA: Diagnosis not present

## 2016-03-12 DIAGNOSIS — Z95828 Presence of other vascular implants and grafts: Secondary | ICD-10-CM

## 2016-03-12 DIAGNOSIS — C561 Malignant neoplasm of right ovary: Secondary | ICD-10-CM

## 2016-03-12 DIAGNOSIS — J910 Malignant pleural effusion: Secondary | ICD-10-CM

## 2016-03-12 DIAGNOSIS — D6481 Anemia due to antineoplastic chemotherapy: Secondary | ICD-10-CM

## 2016-03-12 DIAGNOSIS — G620 Drug-induced polyneuropathy: Secondary | ICD-10-CM

## 2016-03-12 DIAGNOSIS — C7989 Secondary malignant neoplasm of other specified sites: Secondary | ICD-10-CM | POA: Diagnosis not present

## 2016-03-12 DIAGNOSIS — H65191 Other acute nonsuppurative otitis media, right ear: Secondary | ICD-10-CM

## 2016-03-12 DIAGNOSIS — B9689 Other specified bacterial agents as the cause of diseases classified elsewhere: Secondary | ICD-10-CM

## 2016-03-12 LAB — COMPREHENSIVE METABOLIC PANEL
ALT: 30 U/L (ref 0–55)
ANION GAP: 10 meq/L (ref 3–11)
AST: 17 U/L (ref 5–34)
Albumin: 4.1 g/dL (ref 3.5–5.0)
Alkaline Phosphatase: 99 U/L (ref 40–150)
BUN: 12.4 mg/dL (ref 7.0–26.0)
CALCIUM: 9.8 mg/dL (ref 8.4–10.4)
CHLORIDE: 106 meq/L (ref 98–109)
CO2: 25 meq/L (ref 22–29)
Creatinine: 0.8 mg/dL (ref 0.6–1.1)
Glucose: 95 mg/dl (ref 70–140)
Potassium: 3.9 mEq/L (ref 3.5–5.1)
Sodium: 142 mEq/L (ref 136–145)
Total Bilirubin: 0.34 mg/dL (ref 0.20–1.20)
Total Protein: 7.1 g/dL (ref 6.4–8.3)

## 2016-03-12 LAB — CBC WITH DIFFERENTIAL/PLATELET
BASO%: 0.4 % (ref 0.0–2.0)
BASOS ABS: 0 10*3/uL (ref 0.0–0.1)
EOS ABS: 0 10*3/uL (ref 0.0–0.5)
EOS%: 0 % (ref 0.0–7.0)
HCT: 36.4 % (ref 34.8–46.6)
HGB: 12.4 g/dL (ref 11.6–15.9)
LYMPH%: 20.3 % (ref 14.0–49.7)
MCH: 32.1 pg (ref 25.1–34.0)
MCHC: 34.1 g/dL (ref 31.5–36.0)
MCV: 94.3 fL (ref 79.5–101.0)
MONO#: 0.6 10*3/uL (ref 0.1–0.9)
MONO%: 6.3 % (ref 0.0–14.0)
NEUT%: 73 % (ref 38.4–76.8)
NEUTROS ABS: 7.3 10*3/uL — AB (ref 1.5–6.5)
PLATELETS: 214 10*3/uL (ref 145–400)
RBC: 3.86 10*6/uL (ref 3.70–5.45)
RDW: 15.1 % — ABNORMAL HIGH (ref 11.2–14.5)
WBC: 10 10*3/uL (ref 3.9–10.3)
lymph#: 2 10*3/uL (ref 0.9–3.3)
nRBC: 0 % (ref 0–0)

## 2016-03-12 NOTE — Progress Notes (Signed)
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 She had persistent disease by CT CAP 01-20-2016,  after suboptimal resection and 6 cycles of carboplatin taxol thru 01-16-16. She has now had 2 additional cycles of carboplatin taxotere thru 02-27-16, with on pro neulasta.  Patient was exposed to influenza but prophylaxed with Tamiflu (in addition to flu vaccine) and did not become ill with flu. She had acute sinusitis last week, seen by PCP and continues Augmentin given for 10 days. Sinus symptoms and right ear pain are improving, tho still some upper respiratory congestion and drainage and slight discomfort right ear, no fever, minimal cough with clear sputum, no SOB. She tells me that she had some congestion in chest at exam by PCP. She is tolerating the Augmentin without difficulty, no diarrhea.  Otherwise she has done better with present chemo than carbo taxol from standpoint of much less aches and improvement in peripheral neuropathy,  now not really noticeable. She has had nausea x several days after each chemo, as previously and despite aloxi and EMEND. Bowels are moving, no bleeding, no abdominal or pelvic discomfort, no LE swelling. No problems with PAC. Eyelashes are growing back, and hair otherwise also a little.  Remainder of 10 point Review of Systems negative.   Power PAC placed at [facility] 09-09-15 Flu vaccine 10-27-15 Genetics testing 10-27-15 normal ([facility] Breast [facility] panel). 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  ONCOLOGIC HISTORY Patient initially developed constipation and abdominal pain summer 2016 which she thought was stress related, then worsening abdominal and pelvic pain, some back pain and some fever; she had weight  gain ~ 30 lbs in prior 5-6 months. She was seen at [facility] with concern for acute appendicitis. She was evaluated at local ED, I believe [facility], with CT reportedly showing large left and small right pleural effusions, moderate ascites, complex septated cystic mass in low pelvis into left abdomen 13 x 7 x 7 cm, adenopathy Including large diaphragmatic lymph nodes anterior to heart and numerous mesenteric nodes, and apparent carcinomatosis with implants in omentum up to 3.8 cm. She was seen by Dr [name] and referred to Dr Alycia Rossetti. At Dr Elenora Gamma exam 08-31-15 there was large mass in cul de sac and abdominal fluid wave, decreased BS left lower 1/2 of chest. Lab studies included CA 125 33,145; inhibin B <10; CEA <0.5, AFP 2.2, LDH 1281 and HCG negative. Surgery by Dr Alycia Rossetti at [facility] on 09-06-15 was exploratory laparotomy with supracervical hysterectomy, BSO, omentectomy, resection with primary reanastomosis of rectosigmoid and peritoneal stripping. Intraoperative findings included 3 liters of bloody ascites, 12 cm omental mass adherent to uterus, sigmoid colon and rectum which was resected en bloc, bladder nodule resected. At completion of surgery there was residual tumor along entirety of small bowel mesentery with multiple nodules up to 1.5 cm, miliary disease on diaphragm, and peritoneal nodularity along pelvis and rectum (R2 resection). Pathology [facility] [id number] high grade serous carcinoma of bilateral ovaries involving bilateral tubes, uterine wall anterior and posterior and lower uterine segment, omentum, colon. No nodes submitted.  She was transfused 2 units PRBCs on each 9-9 and 09-13-15 for hemoglobin as low as 6.8 - 7.1. She had urgent right thoracentesis for 750 cc on  09-08-15 with cytology positive for high grade serous carcinoma ([id number]) and left thoracentesis for 400 cc on 09-09-15. CT angio chest on POD 1 was negative for PE. She tolerated morphine by PCA post operatively.  She had mild cellulitis at abdominal incision on day of DC, begun on Keflex then. Psychiatry saw in hospital, begun on zoloft. She was discharged home with 28 days of lovenox. She saw Dr Alycia Rossetti on 09-19-15 with staples removed, however wound opened that evening. She was seen by gyn onc provider on 09-23-15, with wound open 3 x 5 cm, 3 cm deep, no evidence of infection. Wet to dry dressings bid were begun, ongoing. She had follow up wound check on 09-27-15, no findings of concern. GOG protocol at [facility] had delay, so that treatment given off protocol with first cycle of carboplatin taxol at [facility] on 09-26-15. She had additional 5 cycles of carboplatin taxol at [facility] thru 12-19-15. Restaging CT CAP showed no diffuse peritoneal disease remaining, small ascites and small right pleural effusion, thickened retrocecal appendix, right external iliac node 0.7 cm. Chemo continued with 2 cycles of carboplatin taxotere thru 02-27-16. .  Objective:  Vital signs in last 24 hours:  BP 105/76 mmHg  Pulse 113  Temp(Src) 98.3 F (36.8 C) (Oral)  Resp 18  Ht 5' 5"  (1.651 m)  Wt 254 lb 9.6 oz (115.486 kg)  BMI 42.37 kg/m2  SpO2 98%  weight up 1 lb Alert, oriented and appropriate. Ambulatory without difficulty. Sounds nasally congested but does not look acutely ill.  Partial alopecia  HEENT:PERRL, sclerae not icteric. Oral mucosa moist without lesions, posterior pharynx clear. Nasal turbinates without purulent drainage. Right TM with minimal erythema and slightly dull, left TM no erythema and less dull.  Neck supple. No JVD.  Lymphatics:no cervical,supraclavicular or inguinal adenopathy Resp: clear to auscultation bilaterally and normal percussion bilaterally Cardio: regular rate and rhythm. No gallop. GI: soft, nontender, not distended, no mass or organomegaly. Normally active bowel sounds. Surgical incision not remarkable. Musculoskeletal/ Extremities: without pitting edema, cords, tenderness Neuro: no significant  peripheral neuropathy. Otherwise nonfocal Skin without rash, ecchymosis, petechiae Portacath-without erythema or tenderness  Lab Results:  Results for orders placed or performed in visit on 03/12/16  CBC with Differential  Result Value Ref Range   WBC 10.0 3.9 - 10.3 10e3/uL   NEUT# 7.3 (H) 1.5 - 6.5 10e3/uL   HGB 12.4 11.6 - 15.9 g/dL   HCT 36.4 34.8 - 46.6 %   Platelets 214 145 - 400 10e3/uL   MCV 94.3 79.5 - 101.0 fL   MCH 32.1 25.1 - 34.0 pg   MCHC 34.1 31.5 - 36.0 g/dL   RBC 3.86 3.70 - 5.45 10e6/uL   RDW 15.1 (H) 11.2 - 14.5 %   lymph# 2.0 0.9 - 3.3 10e3/uL   MONO# 0.6 0.1 - 0.9 10e3/uL   Eosinophils Absolute 0.0 0.0 - 0.5 10e3/uL   Basophils Absolute 0.0 0.0 - 0.1 10e3/uL   NEUT% 73.0 38.4 - 76.8 %   LYMPH% 20.3 14.0 - 49.7 %   MONO% 6.3 0.0 - 14.0 %   EOS% 0.0 0.0 - 7.0 %   BASO% 0.4 0.0 - 2.0 %   nRBC 0 0 - 0 %  Comprehensive metabolic panel  Result Value Ref Range   Sodium 142 136 - 145 mEq/L   Potassium 3.9 3.5 - 5.1 mEq/L   Chloride 106 98 - 109 mEq/L   CO2 25 22 - 29 mEq/L   Glucose 95 70 -  140 mg/dl   BUN 12.4 7.0 - 26.0 mg/dL   Creatinine 0.8 0.6 - 1.1 mg/dL   Total Bilirubin 0.34 0.20 - 1.20 mg/dL   Alkaline Phosphatase 99 40 - 150 U/L   AST 17 5 - 34 U/L   ALT 30 0 - 55 U/L   Total Protein 7.1 6.4 - 8.3 g/dL   Albumin 4.1 3.5 - 5.0 g/dL   Calcium 9.8 8.4 - 10.4 mg/dL   Anion Gap 10 3 - 11 mEq/L   EGFR >90 >90 ml/min/1.73 m2   CA 125 available after visit 93.4, this having been 74 on 2-23 and 57 on 02-02-16 (all of those values by Roche ECLIA lab method).   Studies/Results:  No results found.  Medications: I have reviewed the patient's current medications. Continue augmentin as prescribed. Add decongestant. Continue inhaler  DISCUSSION All of interval history discussed as above. Told patient that CA 125 was just a little higher prior to last chemo. She is aware that we will let her know results from lab drawn today, as this had not resulted at  time of visit. I told her that if CA 125 more elevated we would repeat CT AP and hold additional carbo taxotere until we decide based on the CT if treatment needs to change.  Also told her that if sinusitis/ otitis not resolved, would need to delay next treatment regardless.   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 Malignant pleural fluid and ascites at presentation. Improvement in CA 125 tho this has not normalized, and improvement but some persistent disease by CT CAP after 6 cycles carbo taxol. Has had 2 additional cycles carbo with taxotere thru 02-27-16, with slight further increase in CA 125 by lab resulting after visit today. WIll be in touch with patient, get CT AP, hold carbo taxotere 3-20, and see her back to discuss scan. I will let Dr Alycia Rossetti know situation and we will be glad for her review and recommendations.  Genetics testing negative; Foundation One sent from [facility] on surgical path as above, not eligible for related trials now but may be option in future. 2.Acute sinusitis/ right otitis and possible bronchitis initially: improving on augmentin. We appreciate assistance from Dr Cox's office. 3.surgical menopause with ongoing hot flashes. 4.flu vaccine 10-10-15 5.power PAC  6.chemo nausea: requires EMEND and Aloxi, difficult to control even with this. 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 8.chemo neuropathy: taxane changed to taxotere. On gabapentin. Symptoms improved. 9.mild chemo anemia, iron studies ok 09-30-15. Improved on supplemental ferrous fumarate, continue 10.chemo induced neutropenia with carbo taxol. Using neulasta as on pro with taxotere 11.obesity: BMI 42    All questions answered. Time spent 30 min including >50%  counseling and coordination of care. Cc Drs Alycia Rossetti and Cox   Gordy Levan, MD   03/12/2016, 3:53 PM

## 2016-03-13 ENCOUNTER — Telehealth: Payer: Self-pay | Admitting: Oncology

## 2016-03-13 LAB — CA 125: Cancer Antigen (CA) 125: 93.4 U/mL — ABNORMAL HIGH (ref 0.0–38.1)

## 2016-03-13 NOTE — Telephone Encounter (Signed)
Spoke with patient to confirm March appt date/times per LL 3/14 pof

## 2016-03-13 NOTE — Telephone Encounter (Signed)
Opened in error

## 2016-03-13 NOTE — Telephone Encounter (Signed)
MEDICAL ONCOLOGY  LM on patient's cell asking her to call back to speak either with RN or this MD about results of CA 125 from 03-12-16.  If RN speaks with her, need to let her know that the marker was a little higher again. I would like to repeat CT scan to check on this, before further chemo in case we would do better changing present chemo to something else.  POF done for CT and will cancel chemo on 3-20 to let us get CT done and decide the plan.   Godfrey Pick, MD

## 2016-03-13 NOTE — Telephone Encounter (Signed)
Medical Oncology  I spoke with patient directly shortly after [facility] scheduler reached her.  Explained some increase in marker and plan to hold chemo 3-20/ repeat CT. She is able to have CT on 3-17 at 3:00, will come to [facility] to pick up oral contrast on 3-16 in afternoon. She is aware of NPO x 4 hrs prior to scan.  I told her that I will keep Dr Alycia Rossetti updated. She can see me on 3-20 late afternoon.  Sinusitis and otitis symptoms improving today.  Patient thanked me for calling  L.Marko Plume, MD

## 2016-03-16 ENCOUNTER — Ambulatory Visit (HOSPITAL_COMMUNITY)
Admission: RE | Admit: 2016-03-16 | Discharge: 2016-03-16 | Disposition: A | Discharge status: Home / Self Care | Payer: BC Managed Care – PPO | Source: Ambulatory Visit | Attending: Oncology | Admitting: Oncology

## 2016-03-16 DIAGNOSIS — R935 Abnormal findings on diagnostic imaging of other abdominal regions, including retroperitoneum: Secondary | ICD-10-CM | POA: Insufficient documentation

## 2016-03-16 DIAGNOSIS — C569 Malignant neoplasm of unspecified ovary: Secondary | ICD-10-CM | POA: Insufficient documentation

## 2016-03-16 DIAGNOSIS — R188 Other ascites: Secondary | ICD-10-CM | POA: Diagnosis not present

## 2016-03-16 DIAGNOSIS — K6389 Other specified diseases of intestine: Secondary | ICD-10-CM | POA: Insufficient documentation

## 2016-03-16 DIAGNOSIS — J90 Pleural effusion, not elsewhere classified: Secondary | ICD-10-CM | POA: Insufficient documentation

## 2016-03-16 MED ORDER — IOHEXOL 300 MG/ML  SOLN
100.0000 mL | Freq: Once | INTRAMUSCULAR | Status: AC | PRN
  Administered 2016-03-16: 100 mL via INTRAVENOUS

## 2016-03-18 ENCOUNTER — Other Ambulatory Visit: Payer: Self-pay | Admitting: Oncology

## 2016-03-19 ENCOUNTER — Other Ambulatory Visit: Payer: BC Managed Care – PPO

## 2016-03-19 ENCOUNTER — Ambulatory Visit: Payer: BC Managed Care – PPO

## 2016-03-19 ENCOUNTER — Ambulatory Visit (HOSPITAL_BASED_OUTPATIENT_CLINIC_OR_DEPARTMENT_OTHER): Payer: BC Managed Care – PPO | Admitting: Oncology

## 2016-03-19 ENCOUNTER — Telehealth: Payer: Self-pay | Admitting: Oncology

## 2016-03-19 ENCOUNTER — Encounter: Payer: Self-pay | Admitting: Oncology

## 2016-03-19 VITALS — BP 113/69 | HR 93 | Temp 97.9°F | Resp 18 | Ht 65.0 in | Wt 258.1 lb

## 2016-03-19 DIAGNOSIS — H6501 Acute serous otitis media, right ear: Secondary | ICD-10-CM

## 2016-03-19 DIAGNOSIS — D701 Agranulocytosis secondary to cancer chemotherapy: Secondary | ICD-10-CM

## 2016-03-19 DIAGNOSIS — Z95828 Presence of other vascular implants and grafts: Secondary | ICD-10-CM

## 2016-03-19 DIAGNOSIS — C569 Malignant neoplasm of unspecified ovary: Secondary | ICD-10-CM

## 2016-03-19 DIAGNOSIS — C561 Malignant neoplasm of right ovary: Secondary | ICD-10-CM

## 2016-03-19 DIAGNOSIS — J019 Acute sinusitis, unspecified: Secondary | ICD-10-CM

## 2016-03-19 DIAGNOSIS — D6481 Anemia due to antineoplastic chemotherapy: Secondary | ICD-10-CM

## 2016-03-19 DIAGNOSIS — C562 Malignant neoplasm of left ovary: Secondary | ICD-10-CM | POA: Diagnosis not present

## 2016-03-19 NOTE — Telephone Encounter (Signed)
PET scan to be scheduled by central radiology

## 2016-03-19 NOTE — Telephone Encounter (Signed)
appt made and avs printed °

## 2016-03-19 NOTE — Progress Notes (Signed)
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 Due to some increase in CA 125 marker, she had repeat CT AP on 03-16-16, which appears stable compared to 01-20-16 CT. She had cycle 2 carboplatin taxotere on 02-27-16, however chemo was held today to allow restaging and with persistent URI symptoms.   Patient has no symptoms that seem concerning for progressive gyn cancer. She is still somewhat fatigued, tho has also had acute sinusitis in last 2 weeks. She is eating and drinking fluids, bowels ok, no aches now. Peripheral neuropathy has improved since taxol DCd. No problems with PAC.  She completed antibiotic on 03-16-16, still some mostly clear sinus drainage and ears popping and some right ear discomfort. She has had no fever in last several days and no significant lower respiratory symptoms. She is using OTC pseudoephedrine + ibuprofen, nasocort and claritin.  Bladder ok. No bleeding. No LE swelling.  Remainder of 10 point Review of Systems negative.      Power PAC placed at [facility] 09-09-15 Flu vaccine 10-27-15 Genetics testing 10-27-15 normal ([facility] Breast [facility] panel). 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  ONCOLOGIC HISTORY Patient initially developed constipation and abdominal pain summer 2016 which she thought was stress related, then worsening abdominal and pelvic pain, some back pain and some fever; she had weight gain ~ 30 lbs in prior 5-6 months. She was seen at [facility] with concern for acute appendicitis. She was evaluated at local ED, I believe [facility], with CT reportedly showing large left and small right pleural effusions, moderate ascites, complex septated cystic mass in low pelvis into left abdomen 13 x 7 x 7 cm,  adenopathy Including large diaphragmatic lymph nodes anterior to heart and numerous mesenteric nodes, and apparent carcinomatosis with implants in omentum up to 3.8 cm. She was seen by Dr [name] and referred to Dr Alycia Rossetti. At Dr Elenora Gamma exam 08-31-15 there was large mass in cul de sac and abdominal fluid wave, decreased BS left lower 1/2 of chest. Lab studies included CA 125 33,145; inhibin B <10; CEA <0.5, AFP 2.2, LDH 1281 and HCG negative. Surgery by Dr Alycia Rossetti at [facility] on 09-06-15 was exploratory laparotomy with supracervical hysterectomy, BSO, omentectomy, resection with primary reanastomosis of rectosigmoid and peritoneal stripping. Intraoperative findings included 3 liters of bloody ascites, 12 cm omental mass adherent to uterus, sigmoid colon and rectum which was resected en bloc, bladder nodule resected. At completion of surgery there was residual tumor along entirety of small bowel mesentery with multiple nodules up to 1.5 cm, miliary disease on diaphragm, and peritoneal nodularity along pelvis and rectum (R2 resection). Pathology [facility] [phone] high grade serous carcinoma of bilateral ovaries involving bilateral tubes, uterine wall anterior and posterior and lower uterine segment, omentum, colon. No nodes submitted.  She was transfused 2 units PRBCs on each 9-9 and 09-13-15 for hemoglobin as low as 6.8 - 7.1. She had urgent right thoracentesis for 750 cc on 09-08-15 with cytology positive for high grade serous carcinoma ([id number]) and left thoracentesis for 400 cc on 09-09-15. CT angio chest on POD 1 was negative for PE. She tolerated morphine by PCA post operatively. She had mild cellulitis at abdominal incision on day of DC, begun on Keflex then. Psychiatry saw in  hospital, begun on zoloft. She was discharged home with 28 days of lovenox. She saw Dr Alycia Rossetti on 09-19-15 with staples removed, however wound opened that evening. She was seen by gyn onc provider on 09-23-15, with wound open 3 x 5 cm, 3 cm  deep, no evidence of infection. Wet to dry dressings bid were begun, ongoing. She had follow up wound check on 09-27-15, no findings of concern. GOG protocol at [facility] had delay, so that treatment given off protocol with first cycle of carboplatin taxol at [facility] on 09-26-15. She had additional 5 cycles of carboplatin taxol at [facility] thru 12-19-15. Restaging CT CAP showed no diffuse peritoneal disease remaining, small ascites and small right pleural effusion, thickened retrocecal appendix, right external iliac node 0.7 cm. Chemo continued with 2 cycles of carboplatin taxotere thru 02-27-16.    Review of systems as above, also:  Remainder of 10 point Review of Systems negative.  Objective:  Vital signs in last 24 hours:  BP 113/69 mmHg  Pulse 93  Temp(Src) 97.9 F (36.6 C) (Oral)  Resp 18  Ht 5' 5"  (1.651 m)  Wt 258 lb 1.6 oz (117.073 kg)  BMI 42.95 kg/m2  SpO2 98%  Alert, oriented and appropriate. Ambulatory without difficulty.  Alopecia Weight up 4 lbs HEENT:PERRL, sclerae not icteric. Oral mucosa moist without lesions, posterior pharynx minimal dull erythema without exudate. TMs dull R >L, but no erythema bilaterally.  Neck supple. No JVD.  Lymphatics:no cervical,supraclavicular adenopathy Resp: clear to auscultation bilaterally and normal percussion bilaterally Cardio: regular rate and rhythm. No gallop. GI: soft, nontender, not distended, no mass or organomegaly. Normally active bowel sounds. Surgical incision not remarkable. Musculoskeletal/ Extremities: without pitting edema, cords, tenderness Neuro: no increased peripheral neuropathy. Otherwise nonfocal Skin without rash, ecchymosis, petechiae Portacath-without erythema or tenderness  Lab Results:  Results for orders placed or performed in visit on 03/12/16  CBC with Differential  Result Value Ref Range   WBC 10.0 3.9 - 10.3 10e3/uL   NEUT# 7.3 (H) 1.5 - 6.5 10e3/uL   HGB 12.4 11.6 - 15.9 g/dL   HCT 36.4 34.8 - 46.6 %    Platelets 214 145 - 400 10e3/uL   MCV 94.3 79.5 - 101.0 fL   MCH 32.1 25.1 - 34.0 pg   MCHC 34.1 31.5 - 36.0 g/dL   RBC 3.86 3.70 - 5.45 10e6/uL   RDW 15.1 (H) 11.2 - 14.5 %   lymph# 2.0 0.9 - 3.3 10e3/uL   MONO# 0.6 0.1 - 0.9 10e3/uL   Eosinophils Absolute 0.0 0.0 - 0.5 10e3/uL   Basophils Absolute 0.0 0.0 - 0.1 10e3/uL   NEUT% 73.0 38.4 - 76.8 %   LYMPH% 20.3 14.0 - 49.7 %   MONO% 6.3 0.0 - 14.0 %   EOS% 0.0 0.0 - 7.0 %   BASO% 0.4 0.0 - 2.0 %   nRBC 0 0 - 0 %  Comprehensive metabolic panel  Result Value Ref Range   Sodium 142 136 - 145 mEq/L   Potassium 3.9 3.5 - 5.1 mEq/L   Chloride 106 98 - 109 mEq/L   CO2 25 22 - 29 mEq/L   Glucose 95 70 - 140 mg/dl   BUN 12.4 7.0 - 26.0 mg/dL   Creatinine 0.8 0.6 - 1.1 mg/dL   Total Bilirubin 0.34 0.20 - 1.20 mg/dL   Alkaline Phosphatase 99 40 - 150 U/L   AST 17 5 - 34 U/L   ALT 30 0 - 55 U/L   Total Protein 7.1  6.4 - 8.3 g/dL   Albumin 4.1 3.5 - 5.0 g/dL   Calcium 9.8 8.4 - 10.4 mg/dL   Anion Gap 10 3 - 11 mEq/L   EGFR >90 >90 ml/min/1.73 m2  CA 125  Result Value Ref Range   Cancer Antigen (CA) 125 93.4 (H) 0.0 - 38.1 U/mL    CA 125 by same method 57 on 02-02-16, 74 on 02-23-16 and 93 on 03-12-16  Studies/Results: EXAM: CT ABDOMEN AND PELVIS WITH CONTRAST  COMPARISON: 01/20/2016  FINDINGS: Lower chest: Trace right pleural fluid.  Hepatobiliary: Mildly contracted gallbladder.  Pancreas: Unremarkable  Spleen: Unremarkable  Adrenals/Urinary Tract: Unremarkable  Stomach/Bowel: The appendix looks thick but it may be doubled back upon itself, and is unchanged from 01/20/2016. Borderline prominence of stool in the distal colon. Oral contrast makes its way through to the rectum.  Vascular/Lymphatic: Unremarkable  Reproductive: Hysterectomy and bilateral oophorectomy. AP diameter of the vagina 2.1 cm on image 61 series 603, essentially stable.  Other: This trace amount of fluid/ thickening along  Morrison's pouch, images 26-29 of series 12. There is also trace fluid along the inferior margin of the spleen, image 112 99 series 603. Scattered small mesenteric lymph nodes are present but none of these seem inordinate the enlarged. I do not see any significant omental nodularity currently. The tiny nodule in the omentum that I previously saw just above the transverse colon is about the same, measuring up to 6 mm in diameter on image 32 series 2.  Musculoskeletal: Unremarkable  IMPRESSION: 1. Similar appearance to the prior exam, without well-defined tumor nodularity. Trace ascites along Randol Kern' s pouch in below the liver do just like before. Tiny nodule in the adipose tissue above the transverse colon appears stable. 2. Trace right pleural effusion. 3. Borderline prominence of stool in the distal colon. 4. The AP diameter of the vagina is mildly thickened at 2.1 cm for the double wall thickness. This is stable and now that we are further out postoperatively I doubt that this is all postoperative swelling. I suspect that this is benign/incidental as there is no focal thickening at the vaginal cuff, but this may merit observation.  PACs images reviewed by MD   Medications: I have reviewed the patient's current medications.  DISCUSSION Information including CT results sent to Dr Alycia Rossetti and will follow up with her.  Discussed CT findings, which fortunately do not show any obvious progression, tho most likely still some residual disease.  We have discussed PET, which takes generally ~ 2 weeks to preauthorize so will order that now. Discussed differences between PET and CT.  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 Malignant pleural fluid and ascites at presentation. Improvement in CA 125 tho this has not normalized, and improvement but some persistent  disease by CT CAP after 6 cycles carbo taxol. Had 2 additional cycles carbo with taxotere thru 02-27-16, with slight increase in CA 125, repeat CT AP 03-16-16 stable compared with 01-20-16. Have requested PET, will follow up with Dr Alycia Rossetti and will set up additional treatment depending on review.  Genetics testing negative for germ line BRCA mutation.  Foundation One sent from [facility] on surgical path as above, not eligible for related trials now but may be option in future. 2.Acute sinusitis/ right otitis and possible bronchitis initially: improved but not completely resolved, finished augmentin. Just from this standpoint best to delay chemo at least this  week. Patient to use decongestant more regularly due to right serous otitis. 3.surgical menopause with ongoing hot flashes. 4.flu vaccine 10-10-15 5.power PAC  6.chemo nausea: requires EMEND and Aloxi, difficult to control even with this. 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 8.chemo neuropathy: taxane changed to taxotere. On gabapentin. Symptoms improved. 9.mild chemo anemia, iron studies ok 09-30-15. Improved on supplemental ferrous fumarate, continue 10.chemo induced neutropenia with carbo taxol. Using neulasta as on pro with taxotere 11.obesity: BMI 42    All questions answered. Time spent 25 min including >50% counseling and coordination of care. Messages to gyn oncology and patient understands that we will be in touch with her with next recommendations.     Gordy Levan, MD   03/19/2016, 6:44 PM

## 2016-03-21 ENCOUNTER — Other Ambulatory Visit: Payer: Self-pay | Admitting: Oncology

## 2016-03-21 ENCOUNTER — Telehealth: Payer: Self-pay

## 2016-03-21 ENCOUNTER — Telehealth: Payer: Self-pay | Admitting: Oncology

## 2016-03-21 DIAGNOSIS — C569 Malignant neoplasm of unspecified ovary: Secondary | ICD-10-CM

## 2016-03-21 NOTE — Telephone Encounter (Signed)
Spoke with patient re lab/tx 3/27 @ 10:45 am. Other appointments remain the same.

## 2016-03-21 NOTE — Telephone Encounter (Signed)
Sheena Miles returned call. Discussed adding avastin per Dr Edwyna Shell attached note. Monday's infusion appt adjusted for addition of avastin. Pt aware we are waiting for PA. We will be doing urine protein dipstick lab every 3rd treatment. POF for lab on 3/27 sent.

## 2016-03-21 NOTE — Telephone Encounter (Signed)
lvm for pt to call and ask for Dr Edwyna Shell nurse.

## 2016-03-21 NOTE — Telephone Encounter (Signed)
-----   Message from Gordy Levan, MD sent at 03/21/2016  2:02 PM EDT ----- Juliann Pulse -  she is usually easiest to reach after ~ 3:00 due to work. If you can get her, please let her know that Dr Alycia Rossetti and I have discussed, and suggest adding avastin to present chemo. Tell her this is not a chemo drug but works to inhibit blood supply to cancer areas and can give additional benefit to chemo with gyn cancers-  Please do teaching for avastin also  I will ask Ebony to preauth avastin, may take into next week for this. She has had treatments on Mondays.  I see PET is Fri 3-31 at 1100.  Please see if patient/ infusion able to do treatment on 3-27 otherwise 4-3   thanks

## 2016-03-22 ENCOUNTER — Telehealth: Payer: Self-pay | Admitting: Gynecologic Oncology

## 2016-03-22 NOTE — Telephone Encounter (Signed)
LM for patient to call. PG

## 2016-03-23 ENCOUNTER — Telehealth: Payer: Self-pay | Admitting: Gynecologic Oncology

## 2016-03-23 NOTE — Telephone Encounter (Signed)
TC to patient and we discussed CT results and the coincidence of her CA-125 rising with the change from paclitaxel to taxotere. I d/w her the results of the SCOTROC trial which she said that she and Dr. Marko Plume also discussed. She is comfortable with the plan to add bevacizumab and has her PET scheduled for next Friday. PG

## 2016-03-26 ENCOUNTER — Other Ambulatory Visit: Payer: Self-pay | Admitting: Oncology

## 2016-03-26 ENCOUNTER — Ambulatory Visit (HOSPITAL_BASED_OUTPATIENT_CLINIC_OR_DEPARTMENT_OTHER): Payer: BC Managed Care – PPO

## 2016-03-26 ENCOUNTER — Other Ambulatory Visit (HOSPITAL_BASED_OUTPATIENT_CLINIC_OR_DEPARTMENT_OTHER): Payer: BC Managed Care – PPO

## 2016-03-26 VITALS — BP 124/81 | HR 95 | Resp 16

## 2016-03-26 DIAGNOSIS — C562 Malignant neoplasm of left ovary: Secondary | ICD-10-CM

## 2016-03-26 DIAGNOSIS — Z5112 Encounter for antineoplastic immunotherapy: Secondary | ICD-10-CM | POA: Diagnosis not present

## 2016-03-26 DIAGNOSIS — C569 Malignant neoplasm of unspecified ovary: Secondary | ICD-10-CM

## 2016-03-26 DIAGNOSIS — C561 Malignant neoplasm of right ovary: Secondary | ICD-10-CM

## 2016-03-26 DIAGNOSIS — Z5111 Encounter for antineoplastic chemotherapy: Secondary | ICD-10-CM

## 2016-03-26 LAB — CBC WITH DIFFERENTIAL/PLATELET
BASO%: 0.3 % (ref 0.0–2.0)
BASOS ABS: 0 10*3/uL (ref 0.0–0.1)
EOS ABS: 0 10*3/uL (ref 0.0–0.5)
EOS%: 0 % (ref 0.0–7.0)
HEMATOCRIT: 36.8 % (ref 34.8–46.6)
HGB: 12.3 g/dL (ref 11.6–15.9)
LYMPH%: 5.7 % — ABNORMAL LOW (ref 14.0–49.7)
MCH: 32 pg (ref 25.1–34.0)
MCHC: 33.3 g/dL (ref 31.5–36.0)
MCV: 96.3 fL (ref 79.5–101.0)
MONO#: 0.5 10*3/uL (ref 0.1–0.9)
MONO%: 3.4 % (ref 0.0–14.0)
NEUT%: 90.6 % — ABNORMAL HIGH (ref 38.4–76.8)
NEUTROS ABS: 13.7 10*3/uL — AB (ref 1.5–6.5)
Platelets: 365 10*3/uL (ref 145–400)
RBC: 3.82 10*6/uL (ref 3.70–5.45)
RDW: 16 % — AB (ref 11.2–14.5)
WBC: 15.1 10*3/uL — ABNORMAL HIGH (ref 3.9–10.3)
lymph#: 0.9 10*3/uL (ref 0.9–3.3)

## 2016-03-26 LAB — UA PROTEIN, DIPSTICK - CHCC: Protein, ur: NEGATIVE mg/dL

## 2016-03-26 LAB — COMPREHENSIVE METABOLIC PANEL
ALBUMIN: 3.9 g/dL (ref 3.5–5.0)
ALK PHOS: 68 U/L (ref 40–150)
ALT: 15 U/L (ref 0–55)
AST: 12 U/L (ref 5–34)
Anion Gap: 10 mEq/L (ref 3–11)
BUN: 11.2 mg/dL (ref 7.0–26.0)
CALCIUM: 10 mg/dL (ref 8.4–10.4)
CO2: 22 mEq/L (ref 22–29)
CREATININE: 0.7 mg/dL (ref 0.6–1.1)
Chloride: 109 mEq/L (ref 98–109)
EGFR: >90 mL/min/{1.73_m2} (ref >90)
GLUCOSE: 132 mg/dL (ref 70–140)
Potassium: 3.9 mEq/L (ref 3.5–5.1)
Sodium: 141 mEq/L (ref 136–145)
TOTAL PROTEIN: 7.2 g/dL (ref 6.4–8.3)
Total Bilirubin: <0.3 mg/dL (ref 0.20–1.20)

## 2016-03-26 MED ORDER — SODIUM CHLORIDE 0.9 % IV SOLN
60.0000 mg/m2 | Freq: Once | INTRAVENOUS | Status: AC
  Administered 2016-03-26: 140 mg via INTRAVENOUS
  Filled 2016-03-26: qty 14

## 2016-03-26 MED ORDER — SODIUM CHLORIDE 0.9 % IV SOLN
15.0000 mg/kg | Freq: Once | INTRAVENOUS | Status: AC
  Administered 2016-03-26: 1750 mg via INTRAVENOUS
  Filled 2016-03-26: qty 56

## 2016-03-26 MED ORDER — PALONOSETRON HCL INJECTION 0.25 MG/5ML
INTRAVENOUS | Status: AC
  Filled 2016-03-26: qty 5

## 2016-03-26 MED ORDER — SODIUM CHLORIDE 0.9 % IV SOLN
750.0000 mg | Freq: Once | INTRAVENOUS | Status: AC
  Administered 2016-03-26: 750 mg via INTRAVENOUS
  Filled 2016-03-26: qty 75

## 2016-03-26 MED ORDER — SODIUM CHLORIDE 0.9 % IV SOLN: Freq: Once | INTRAVENOUS | Status: DC

## 2016-03-26 MED ORDER — PALONOSETRON HCL INJECTION 0.25 MG/5ML
0.2500 mg | Freq: Once | INTRAVENOUS | Status: AC
  Administered 2016-03-26: 0.25 mg via INTRAVENOUS

## 2016-03-26 MED ORDER — PEGFILGRASTIM 6 MG/0.6ML ~~LOC~~ PSKT
6.0000 mg | PREFILLED_SYRINGE | Freq: Once | SUBCUTANEOUS | Status: AC
  Administered 2016-03-26: 6 mg via SUBCUTANEOUS
  Filled 2016-03-26: qty 0.6

## 2016-03-26 MED ORDER — SODIUM CHLORIDE 0.9% FLUSH
10.0000 mL | INTRAVENOUS | Status: DC | PRN
Start: 2016-03-26 — End: 2016-03-26
  Filled 2016-03-26: qty 10

## 2016-03-26 MED ORDER — SODIUM CHLORIDE 0.9 % IV SOLN
Freq: Once | INTRAVENOUS | Status: AC
  Administered 2016-03-26: 14:00:00 via INTRAVENOUS
  Filled 2016-03-26: qty 5

## 2016-03-26 MED ORDER — CARBOPLATIN CHEMO INTRADERMAL TEST DOSE 100MCG/0.02ML
100.0000 ug | Freq: Once | INTRADERMAL | Status: AC
  Administered 2016-03-26: 100 ug via INTRADERMAL
  Filled 2016-03-26: qty 0.01

## 2016-03-26 MED ORDER — HEPARIN SOD (PORK) LOCK FLUSH 100 UNIT/ML IV SOLN
500.0000 [IU] | Freq: Once | INTRAVENOUS | Status: AC | PRN
  Administered 2016-03-26: 500 [IU]
  Filled 2016-03-26: qty 5

## 2016-03-26 MED ORDER — SODIUM CHLORIDE 0.9% FLUSH
10.0000 mL | INTRAVENOUS | Status: DC | PRN
  Administered 2016-03-26: 10 mL
  Filled 2016-03-26: qty 10

## 2016-03-26 MED ORDER — HEPARIN SOD (PORK) LOCK FLUSH 100 UNIT/ML IV SOLN
500.0000 [IU] | Freq: Once | INTRAVENOUS | Status: DC | PRN
  Filled 2016-03-26: qty 5

## 2016-03-26 MED ORDER — SODIUM CHLORIDE 0.9 % IV SOLN
Freq: Once | INTRAVENOUS | Status: AC
  Administered 2016-03-26: 12:00:00 via INTRAVENOUS

## 2016-03-26 NOTE — Patient Instructions (Signed)
[facility] Discharge Instructions for Patients Receiving Chemotherapy  Today you received the following chemotherapy agents: Taxotere, Carboplatin, and Avastin.  To help prevent nausea and vomiting after your treatment, we encourage you to take your nausea medication as directed.   If you develop nausea and vomiting that is not controlled by your nausea medication, call the clinic.   BELOW ARE SYMPTOMS THAT SHOULD BE REPORTED IMMEDIATELY:  *FEVER GREATER THAN 100.5 F  *CHILLS WITH OR WITHOUT FEVER  NAUSEA AND VOMITING THAT IS NOT CONTROLLED WITH YOUR NAUSEA MEDICATION  *UNUSUAL SHORTNESS OF BREATH  *UNUSUAL BRUISING OR BLEEDING  TENDERNESS IN MOUTH AND THROAT WITH OR WITHOUT PRESENCE OF ULCERS  *URINARY PROBLEMS  *BOWEL PROBLEMS  UNUSUAL RASH Items with * indicate a potential emergency and should be followed up as soon as possible.  Feel free to call the clinic you have any questions or concerns. The clinic phone number is [phone].  Please show the [facility] at check-in to the Emergency Department and triage nurse.

## 2016-03-29 ENCOUNTER — Telehealth: Payer: Self-pay | Admitting: Oncology

## 2016-03-29 ENCOUNTER — Other Ambulatory Visit: Payer: Self-pay | Admitting: Oncology

## 2016-03-29 ENCOUNTER — Telehealth (HOSPITAL_COMMUNITY): Payer: Self-pay | Admitting: Oncology

## 2016-03-29 ENCOUNTER — Telehealth: Payer: Self-pay

## 2016-03-29 NOTE — Telephone Encounter (Signed)
Pt called stating she is getting conflicting phone calls from [facility]. She has a PET scheduled for tomorrow 3/31 at 1100. She is under the impression this has been denied by her insurance. It is still on schedule. Pt requesting call back. This nurse left voice message on Darlena Clark's line asking about prior authorization.

## 2016-03-29 NOTE — Telephone Encounter (Signed)
Medical Oncology  PET not preauthorized by insurance. I spoke directly with patient, told her not to come for scan on 03-30-16. I spoke with [facility] scheduling, who will cancel the scan.   Patient doing ok since first avastin + chemo 03-26-16, just very tired as always after chemo. She will keep appointments as scheduled. We may try again to get PET with next imaging.  L.Livesay, MD.

## 2016-03-29 NOTE — Telephone Encounter (Signed)
S/w pt and she said she spoke with the AIM people at [facility]. They explained there was not enough medical information to authorize the PET. There may need to be a peer to peer conversation. Elainie will call back early tomorrow morning.

## 2016-03-30 ENCOUNTER — Ambulatory Visit (HOSPITAL_COMMUNITY): Payer: BC Managed Care – PPO

## 2016-04-04 ENCOUNTER — Other Ambulatory Visit: Payer: Self-pay | Admitting: Oncology

## 2016-04-04 DIAGNOSIS — C569 Malignant neoplasm of unspecified ovary: Secondary | ICD-10-CM

## 2016-04-05 ENCOUNTER — Telehealth: Payer: Self-pay | Admitting: Pharmacist

## 2016-04-05 ENCOUNTER — Encounter: Payer: Self-pay | Admitting: Oncology

## 2016-04-05 ENCOUNTER — Other Ambulatory Visit: Payer: Self-pay | Admitting: *Deleted

## 2016-04-05 ENCOUNTER — Ambulatory Visit (HOSPITAL_BASED_OUTPATIENT_CLINIC_OR_DEPARTMENT_OTHER): Payer: BC Managed Care – PPO

## 2016-04-05 ENCOUNTER — Ambulatory Visit (HOSPITAL_BASED_OUTPATIENT_CLINIC_OR_DEPARTMENT_OTHER): Payer: BC Managed Care – PPO | Admitting: Oncology

## 2016-04-05 VITALS — BP 119/78 | HR 102 | Temp 98.6°F | Resp 20 | Ht 65.0 in | Wt 256.6 lb

## 2016-04-05 DIAGNOSIS — J069 Acute upper respiratory infection, unspecified: Secondary | ICD-10-CM

## 2016-04-05 DIAGNOSIS — C561 Malignant neoplasm of right ovary: Secondary | ICD-10-CM

## 2016-04-05 DIAGNOSIS — D701 Agranulocytosis secondary to cancer chemotherapy: Secondary | ICD-10-CM | POA: Diagnosis not present

## 2016-04-05 DIAGNOSIS — C562 Malignant neoplasm of left ovary: Secondary | ICD-10-CM

## 2016-04-05 DIAGNOSIS — C569 Malignant neoplasm of unspecified ovary: Secondary | ICD-10-CM

## 2016-04-05 DIAGNOSIS — T451X5A Adverse effect of antineoplastic and immunosuppressive drugs, initial encounter: Secondary | ICD-10-CM

## 2016-04-05 DIAGNOSIS — D6481 Anemia due to antineoplastic chemotherapy: Secondary | ICD-10-CM

## 2016-04-05 DIAGNOSIS — Z95828 Presence of other vascular implants and grafts: Secondary | ICD-10-CM

## 2016-04-05 DIAGNOSIS — Z452 Encounter for adjustment and management of vascular access device: Secondary | ICD-10-CM | POA: Diagnosis not present

## 2016-04-05 DIAGNOSIS — Z23 Encounter for immunization: Secondary | ICD-10-CM

## 2016-04-05 DIAGNOSIS — L739 Follicular disorder, unspecified: Secondary | ICD-10-CM

## 2016-04-05 LAB — CBC WITH DIFFERENTIAL/PLATELET
BASO%: 0.4 % (ref 0.0–2.0)
Basophils Absolute: 0 10*3/uL (ref 0.0–0.1)
EOS%: 0.4 % (ref 0.0–7.0)
Eosinophils Absolute: 0 10*3/uL (ref 0.0–0.5)
HEMATOCRIT: 34.4 % — AB (ref 34.8–46.6)
HEMOGLOBIN: 11.7 g/dL (ref 11.6–15.9)
LYMPH#: 1.5 10*3/uL (ref 0.9–3.3)
LYMPH%: 65.6 % — ABNORMAL HIGH (ref 14.0–49.7)
MCH: 32.5 pg (ref 25.1–34.0)
MCHC: 34 g/dL (ref 31.5–36.0)
MCV: 95.6 fL (ref 79.5–101.0)
MONO#: 0.6 10*3/uL (ref 0.1–0.9)
MONO%: 27.2 % — AB (ref 0.0–14.0)
NEUT%: 6.4 % — ABNORMAL LOW (ref 38.4–76.8)
Platelets: 240 10*3/uL (ref 145–400)
RBC: 3.6 10*6/uL — ABNORMAL LOW (ref 3.70–5.45)
RDW: 14.1 % (ref 11.2–14.5)
WBC: 2.2 10*3/uL — AB (ref 3.9–10.3)
nRBC: 0 % (ref 0–0)

## 2016-04-05 LAB — COMPREHENSIVE METABOLIC PANEL
ALBUMIN: 3.8 g/dL (ref 3.5–5.0)
ALK PHOS: 72 U/L (ref 40–150)
ALT: 27 U/L (ref 0–55)
AST: 19 U/L (ref 5–34)
Anion Gap: 8 mEq/L (ref 3–11)
BILIRUBIN TOTAL: 0.32 mg/dL (ref 0.20–1.20)
BUN: 10.3 mg/dL (ref 7.0–26.0)
CALCIUM: 10 mg/dL (ref 8.4–10.4)
CO2: 27 mEq/L (ref 22–29)
CREATININE: 0.7 mg/dL (ref 0.6–1.1)
Chloride: 106 mEq/L (ref 98–109)
EGFR: >90 mL/min/{1.73_m2} (ref >90)
Glucose: 91 mg/dl (ref 70–140)
Potassium: 3.9 mEq/L (ref 3.5–5.1)
Sodium: 141 mEq/L (ref 136–145)
Total Protein: 7 g/dL (ref 6.4–8.3)

## 2016-04-05 MED ORDER — PEGFILGRASTIM INJECTION 6 MG/0.6ML ~~LOC~~
6.0000 mg | PREFILLED_SYRINGE | Freq: Once | SUBCUTANEOUS | Status: AC
  Administered 2016-04-05: 6 mg via SUBCUTANEOUS
  Filled 2016-04-05: qty 0.6

## 2016-04-05 MED ORDER — CEFUROXIME AXETIL 250 MG PO TABS: 250.0000 mg | ORAL_TABLET | Freq: Two times a day (BID) | ORAL | Status: DC

## 2016-04-05 MED ORDER — HYDROMORPHONE HCL 2 MG PO TABS: ORAL_TABLET | ORAL | Status: DC

## 2016-04-05 MED ORDER — SODIUM CHLORIDE 0.9% FLUSH
10.0000 mL | INTRAVENOUS | Status: DC | PRN
  Administered 2016-04-05: 10 mL via INTRAVENOUS
  Filled 2016-04-05: qty 10

## 2016-04-05 MED ORDER — HEPARIN SOD (PORK) LOCK FLUSH 100 UNIT/ML IV SOLN
500.0000 [IU] | Freq: Once | INTRAVENOUS | Status: AC
  Administered 2016-04-05: 500 [IU] via INTRAVENOUS
  Filled 2016-04-05: qty 5

## 2016-04-05 MED ORDER — LORAZEPAM 1 MG PO TABS: 1.0000 mg | ORAL_TABLET | Freq: Three times a day (TID) | ORAL | Status: DC

## 2016-04-05 NOTE — Telephone Encounter (Signed)
Patient in today and stated Neulasta OnPro administered 3/27 with last chemo did not work. ANC = 0.1 Replacement OnPro will be supplied by [employer], 3/27 dose to be credited. Patient will receive Neulasta today with next cycle of chemotherapy to be delayed.

## 2016-04-05 NOTE — Progress Notes (Signed)
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 She had cycle 3 carbo taxotere with first avastin on 03-26-16. On pro neulasta seems not to have injected correctly, tho patient did not report this until today's visit. She is neutropenic with ANC 0.1 today.  She has had some recent increase in CA 125, tho CT AP on 03-16-16 was stable; her insurance would not approve PET following that CT. She is to see Dr Alycia Rossetti on 04-11-16.   Patient fortunately is not febrile, tho she has been very tired, some head congestion and some slight folliculitis on scalp. Mother has had URI at home. She did not have severe bone aches this cycle, likely reflecting no neulasta due to malfunction of on pro injector. She has slight NP cough without SOB, no new or different pain, vomiting, any increase in peripheral neuropathy (better since off of taxol). No bleeding, bowels are moving, no problems with PAC. Voiding ok. GERD ok. Is eating and drinking fluids. Slight increased facial acne also. Remainder of 10 point Review of Systems negative.   She took pictures of the on pro injector and may still have the device at home, which she will return to [place] if so. [place] pharmacist has spoken with company now, will replace the on pro so that hopefully she is not charged.     Power PAC placed at [facility] 09-09-15 Flu vaccine 10-27-15 Genetics testing 10-27-15 normal ([facility] panel). 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  ONCOLOGIC HISTORY Patient initially developed constipation and abdominal pain summer 2016 which she thought was stress related, then worsening abdominal and pelvic pain, some back pain and some fever; she had weight gain ~ 30 lbs in  prior 5-6 months. She was seen at [facility] with concern for acute appendicitis. She was evaluated at local ED, I believe [facility], with CT reportedly showing large left and small right pleural effusions, moderate ascites, complex septated cystic mass in low pelvis into left abdomen 13 x 7 x 7 cm, adenopathy Including large diaphragmatic lymph nodes anterior to heart and numerous mesenteric nodes, and apparent carcinomatosis with implants in omentum up to 3.8 cm. She was seen by Dr [name] and referred to Dr Alycia Rossetti. At Dr Elenora Gamma exam 08-31-15 there was large mass in cul de sac and abdominal fluid wave, decreased BS left lower 1/2 of chest. Lab studies included CA 125 33,145; inhibin B <10; CEA <0.5, AFP 2.2, LDH 1281 and HCG negative. Surgery by Dr Alycia Rossetti at [facility] on 09-06-15 was exploratory laparotomy with supracervical hysterectomy, BSO, omentectomy, resection with primary reanastomosis of rectosigmoid and peritoneal stripping. Intraoperative findings included 3 liters of bloody ascites, 12 cm omental mass adherent to uterus, sigmoid colon and rectum which was resected en bloc, bladder nodule resected. At completion of surgery there was residual tumor along entirety of small bowel mesentery with multiple nodules up to 1.5 cm, miliary disease on diaphragm, and peritoneal nodularity along pelvis and rectum (R2 resection). Pathology [facility] [id number] high grade serous carcinoma of bilateral ovaries involving bilateral tubes, uterine wall anterior and posterior and lower uterine segment, omentum, colon. No nodes submitted.  She was transfused 2 units PRBCs on each 9-9 and 09-13-15 for hemoglobin as low  as 6.8 - 7.1. She had urgent right thoracentesis for 750 cc on 09-08-15 with cytology positive for high grade serous carcinoma ([id number]) and left thoracentesis for 400 cc on 09-09-15. CT angio chest on POD 1 was negative for PE. She tolerated morphine by PCA post operatively. She had mild  cellulitis at abdominal incision on day of DC, begun on Keflex then. Psychiatry saw in hospital, begun on zoloft. She was discharged home with 28 days of lovenox. She saw Dr Alycia Rossetti on 09-19-15 with staples removed, however wound opened that evening. She was seen by gyn onc provider on 09-23-15, with wound open 3 x 5 cm, 3 cm deep, no evidence of infection. Wet to dry dressings bid were begun, ongoing. She had follow up wound check on 09-27-15, no findings of concern. GOG protocol at [facility] had delay, so that treatment given off protocol with first cycle of carboplatin taxol at [facility] on 09-26-15. She had additional 5 cycles of carboplatin taxol at [facility] thru 12-19-15. Restaging CT CAP showed no diffuse peritoneal disease remaining, small ascites and small right pleural effusion, thickened retrocecal appendix, right external iliac node 0.7 cm. Chemo continued with 2 cycles of carboplatin taxotere thru 02-27-16.    Objective:  Vital signs in last 24 hours:  BP 119/78 mmHg  Pulse 102  Temp(Src) 98.6 F (37 C) (Oral)  Resp 20  Ht 5' 5"  (1.651 m)  Wt 256 lb 9.6 oz (116.393 kg)  BMI 42.70 kg/m2  SpO2 99% Weight down 2 lbs Alert, oriented and appropriate. Ambulatory without assistance.  Alopecia  HEENT:PERRL, sclerae not icteric. Oral mucosa moist without lesions, posterior pharynx minimal bilateral erythema without exudate. Nasal turbinates slightly erythematous, no purulent drainage  Neck supple. No JVD.  Lymphatics:no cervical,supraclavicular, axillary or inguinal adenopathy Resp: clear to auscultation bilaterally and normal percussion bilaterally Cardio: regular rate and rhythm. No gallop. GI: abdomen obese, soft, nontender, not distended, no obvious mass or organomegaly. Some bowel sounds. Surgical incision not remarkable. Musculoskeletal/ Extremities: without pitting edema, cords, tenderness Neuro: no significant residual peripheral neuropathy. Otherwise nonfocal. PSYCH mood and affect  appropriate Skin  Slight scattered folliculitis across scalp, otherwise without ecchymosis, petechiae Portacath-without erythema or tenderness  Lab Results:  Results for orders placed or performed in visit on 04/05/16  CBC with Differential  Result Value Ref Range   WBC 2.2 (L) 3.9 - 10.3 10e3/uL   NEUT# 0.1 Repeated and Verified (LL) 1.5 - 6.5 10e3/uL   HGB 11.7 11.6 - 15.9 g/dL   HCT 34.4 (L) 34.8 - 46.6 %   Platelets 240 145 - 400 10e3/uL   MCV 95.6 79.5 - 101.0 fL   MCH 32.5 25.1 - 34.0 pg   MCHC 34.0 31.5 - 36.0 g/dL   RBC 3.60 (L) 3.70 - 5.45 10e6/uL   RDW 14.1 11.2 - 14.5 %   lymph# 1.5 0.9 - 3.3 10e3/uL   MONO# 0.6 0.1 - 0.9 10e3/uL   Eosinophils Absolute 0.0 0.0 - 0.5 10e3/uL   Basophils Absolute 0.0 0.0 - 0.1 10e3/uL   NEUT% 6.4 (L) 38.4 - 76.8 %   LYMPH% 65.6 (H) 14.0 - 49.7 %   MONO% 27.2 (H) 0.0 - 14.0 %   EOS% 0.4 0.0 - 7.0 %   BASO% 0.4 0.0 - 2.0 %   nRBC 0 0 - 0 %  Comprehensive metabolic panel  Result Value Ref Range   Sodium 141 136 - 145 mEq/L   Potassium 3.9 3.5 - 5.1 mEq/L   Chloride 106 98 -  109 mEq/L   CO2 27 22 - 29 mEq/L   Glucose 91 70 - 140 mg/dl   BUN 10.3 7.0 - 26.0 mg/dL   Creatinine 0.7 0.6 - 1.1 mg/dL   Total Bilirubin 0.32 0.20 - 1.20 mg/dL   Alkaline Phosphatase 72 40 - 150 U/L   AST 19 5 - 34 U/L   ALT 27 0 - 55 U/L   Total Protein 7.0 6.4 - 8.3 g/dL   Albumin 3.8 3.5 - 5.0 g/dL   Calcium 10.0 8.4 - 10.4 mg/dL   Anion Gap 8 3 - 11 mEq/L   EGFR >90 >90 ml/min/1.73 m2  CA 125  Result Value Ref Range   Cancer Antigen (CA) 125 122.3 (H) 0.0 - 38.1 U/mL   CA 125 by same method was 93 on 03-12-16, 74 on 02-23-16 and 57 on 02-02-16.   Studies/Results:  No results found. PET denied by insurance for 03-30-16.  Medications: I have reviewed the patient's current medications. Neulasta given by nurse now. Ceftin 250 mg bid in light of respiratory symptoms and folliculitis in setting of severe neutropenia.  Ativan and dilaudid (used for taxane/  g CSF aches) refilled.  DISCUSSION Neutropenic precautions reviewed. She needs to stay out of work until counts have recovered, which may be delayed due to timing of neulasta now day 11 of this cycle of taxotere chemotherapy.  Will need to delay next treatment until at least 4-20 due to neulasta today. Will recheck counts when she sees Dr Alycia Rossetti on 04-11-16, or sooner if concerns.   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 Malignant pleural fluid and ascites at presentation. Persistent disease after 6 cycles carbo taxol, changed to [place] taxotere due to neuropathy, avastin added beginning 03-26-16. CT AP 03-16-16 stable. CA 125 again slightly higher today. Following with addition of avastin, can try again to get PET with next imaging.  Genetics testing negative for germ line BRCA mutation. Foundation One sent from [facility] on surgical path as above, not eligible for related trials now but may be option in future. 2.Chemo neutropenia: ANC 0.1 today, on pro injector apparently did not function correctly day after chemo. Given neulasta now, however may have longer time to recovery due to this delay. Neutropenic precautions.  Will repeat counts at least with Dr Elenora Gamma visit on 04-11-16. She needs to stay out of work until no longer neutropenic. Next chemo will need to be no sooner than 04-19-16 with neulasta today.  3. Upper respiratory congestion, NP cough, family with URI symptoms: in setting of severe neutropenia. Add Ceftin, continue allergy meds, push fluids, watch temperature.  4.folliculitis on scalp: likely with 3 days of steroids around taxotere. Recommended baby shampoo and have started Ceftin. 5.power PAC  6.chemo nausea: requires EMEND and Aloxi, difficult to control even with this. 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 8.chemo neuropathy: taxane changed to taxotere. On gabapentin. Symptoms improved. 9.mild chemo anemia, iron studies ok 09-30-15. Improved on supplemental ferrous fumarate, continue 10.chemo induced neutropenia with carbo taxol. Using neulasta as on pro with taxotere 11.obesity: BMI 42 12.Surgical menopause with hot flashes 13. Flu vaccine 10-10-15   All questions answered and she knows to call if temp >=100.5 or worsening respiratory symptoms. Time spent 30 min including >50% counseling and  coordination of care.   LIVESAY,LENNIS P, MD   04/06/2016, 4:09 PM

## 2016-04-05 NOTE — Patient Instructions (Signed)

## 2016-04-06 ENCOUNTER — Telehealth: Payer: Self-pay | Admitting: Oncology

## 2016-04-06 LAB — CA 125: Cancer Antigen (CA) 125: 122.3 U/mL — ABNORMAL HIGH (ref 0.0–38.1)

## 2016-04-06 NOTE — Telephone Encounter (Signed)
Spoke with patient to confirm appt dates/times per LL 4/6 pof

## 2016-04-06 NOTE — Telephone Encounter (Signed)
Medical Oncology  Called patient to check due to neutropenia on 04-05-16 (day 11 cycle 3 carbo taxotere avastin) after apparent malfunction of on pro neulasta injector. ANC was 0.1 on 4-6 and she was given neulasta on 4-6.   She is still very tired, sounds more congested and is hoarse today, tho cough better. She started Ceftin last pm. She has had no fever.  She knows to push fluids and to call for 100.5 or higher, would need to go to ED over weekend if so. Told her that counts may not recover as quickly as usual due to delay in receiving neulasta. Told her that we should be sure counts out of neutropenic range before she returns to work, could check on 4-10 or 4-11 if still very tired or if much better and she wants to go back before CBC with Dr Elenora Gamma visit on 4-12.  Patient appreciated call.  Godfrey Pick, MD

## 2016-04-09 ENCOUNTER — Telehealth: Payer: Self-pay

## 2016-04-09 ENCOUNTER — Other Ambulatory Visit (HOSPITAL_BASED_OUTPATIENT_CLINIC_OR_DEPARTMENT_OTHER): Payer: BC Managed Care – PPO

## 2016-04-09 ENCOUNTER — Ambulatory Visit (HOSPITAL_COMMUNITY): Payer: BC Managed Care – PPO

## 2016-04-09 DIAGNOSIS — C569 Malignant neoplasm of unspecified ovary: Secondary | ICD-10-CM

## 2016-04-09 DIAGNOSIS — C561 Malignant neoplasm of right ovary: Secondary | ICD-10-CM

## 2016-04-09 LAB — CBC WITH DIFFERENTIAL/PLATELET
BASO%: 0.7 % (ref 0.0–2.0)
Basophils Absolute: 0.1 10*3/uL (ref 0.0–0.1)
EOS%: 0.3 % (ref 0.0–7.0)
Eosinophils Absolute: 0.1 10*3/uL (ref 0.0–0.5)
HCT: 38.2 % (ref 34.8–46.6)
HGB: 12.5 g/dL (ref 11.6–15.9)
LYMPH%: 14.8 % (ref 14.0–49.7)
MCH: 31.3 pg (ref 25.1–34.0)
MCHC: 32.8 g/dL (ref 31.5–36.0)
MCV: 95.3 fL (ref 79.5–101.0)
MONO#: 1.4 10*3/uL — ABNORMAL HIGH (ref 0.1–0.9)
MONO%: 6.7 % (ref 0.0–14.0)
NEUT%: 77.5 % — ABNORMAL HIGH (ref 38.4–76.8)
NEUTROS ABS: 16.3 10*3/uL — AB (ref 1.5–6.5)
Platelets: 246 10*3/uL (ref 145–400)
RBC: 4.01 10*6/uL (ref 3.70–5.45)
RDW: 15.2 % — ABNORMAL HIGH (ref 11.2–14.5)
WBC: 21 10*3/uL — AB (ref 3.9–10.3)
lymph#: 3.1 10*3/uL (ref 0.9–3.3)

## 2016-04-09 NOTE — Telephone Encounter (Signed)
Sheena Miles came in to follow up on CBC as she is feeling better and wanted to see if counts are up so she could return to work. Her ANC is up to 16.3.  Her voice remains slightly horse but feeling a lot better.   She will return to work tomorrow.  Cancelled labs for 04-11-16 prior to Dr. Elenora Gamma appointment.

## 2016-04-09 NOTE — Telephone Encounter (Signed)
-----   Message from Gordy Levan, MD sent at 04/05/2016  4:16 PM EDT ----- For CBC on 4-12 with Dr Elenora Gamma appointment. If still any URI or low counts, please also put her in to see Mikey Bussing on 4-17 with CBC  Thanks Lennis

## 2016-04-11 ENCOUNTER — Ambulatory Visit: Payer: BC Managed Care – PPO | Attending: Gynecologic Oncology | Admitting: Gynecologic Oncology

## 2016-04-11 ENCOUNTER — Other Ambulatory Visit: Payer: BC Managed Care – PPO

## 2016-04-11 ENCOUNTER — Encounter: Payer: Self-pay | Admitting: Gynecologic Oncology

## 2016-04-11 VITALS — BP 118/74 | HR 87 | Temp 98.3°F | Resp 18 | Ht 65.0 in | Wt 253.8 lb

## 2016-04-11 DIAGNOSIS — C569 Malignant neoplasm of unspecified ovary: Secondary | ICD-10-CM | POA: Diagnosis present

## 2016-04-11 DIAGNOSIS — Z9221 Personal history of antineoplastic chemotherapy: Secondary | ICD-10-CM

## 2016-04-11 DIAGNOSIS — R971 Elevated cancer antigen 125 [CA 125]: Secondary | ICD-10-CM

## 2016-04-11 DIAGNOSIS — Z79899 Other long term (current) drug therapy: Secondary | ICD-10-CM | POA: Diagnosis not present

## 2016-04-11 DIAGNOSIS — F419 Anxiety disorder, unspecified: Secondary | ICD-10-CM | POA: Diagnosis not present

## 2016-04-11 NOTE — Progress Notes (Signed)
Follow Up Note: Gyn-Onc  Sheena Miles 30 y.o. female  CC:  Chief Complaint  Patient presents with  . Follow-up    HPI:  Sheena Miles is a 30 year old female, gravida 0, initially referred by Dr. [name] for a newly diagnosed ovarian mass.  She had irregular cycles for many years and went through menarche at the age of 30.  She was experiencing a lot of stress as she had a difficult relationship with her husband and she left him in June of this year. She began experiencing some pain in her shoulder, abdomen, leg and began having some irregular bowel movements which she felt related to stress.  She sought care at an Urgent Care for her moderate anxiety.  At that time, an exam was performed that revealed some tenderness in the right lower quadrant. She also had a low-grade temperature and was referred to the emergency room to rule out appendicitis or cholecystitis. She reports her abdomen was getting "hard" in early August with intermittent back pain. She also began experiencing pelvic pain over the past 2 weeks with intermittent nausea/vomiting that started in June. She's gained approximately 30 pounds in the last 5-6 months and is been having increasing fatigue.  In the emergency room, her CT scan revealed: She had a small right and large left sided pleural effusion with mild underlying atelectasis bilaterally. The gallbladder appeared normal. There was some ascites with a fluid collection in the area of the falciform ligament measuring 4 x 3 cm. There was another lentiform collection with a similar attenuation. Overall she had moderate volume ascites throughout the abdomen. The pancreas, spleen, adrenals, kidneys, stomach, bowel were unremarkable. There are large diaphragmatic lymph nodes anterior to the heart measuring up to 1.3 cm. There are numerous small mesenteric lymph nodes present. The uterus and ovaries were not discretely identified. There is a multi-cystic septated complex mass  arising out of the low pelvis and extending into the left abdomen. It measured 13 x 7 x 7 cm. A prominently had cystic component along the cranial edge along the caudal edge of the abnormality. There is infiltration and probable carcinomatosis of the omentum. There were numerous omental implants with the largest measuring 3.8 cm.   She was initially seen on 08/31/15.  Lab work resulted: CA 125 33145, LDH 663, Inhibin B <10, CEA <0.5, AFP 2.2. On 09/06/15, she underwent an exploratory laparotomy with supracervical hysterectomy, bilateral salpingoophorectomy, omentectomy, peritoneal stripping, resection of bladder nodule, rectosigmoid resection with mobilization of the splenic flexure and primary end-to-end anastamosis (R2 resection). Operative findings included: 1. 3L of bloody ascites on abdominal entry  2. 12cm omental mass adherent to the colon, pelvic mass, anterior abdominal wall - resected and frozen with serous cancer  3. Bilateral adnexal masses densely adherent to the uterus, sigmoid colon, and rectum - resected en bloc  4. Pelvic peritoneal nodularity  5. 3cm Bladder nodule - resected  6. Residual tumor along the entirely of the small bowel mesentery with multiple nodules up to 1.5cm in size, miliary disease of the diaphragm, and peritoneal nodularity along the pelvis and rectum (R2)  7. Normal appearing gallbladder, liver, spleen, and stomach  8. Negative bubble test following primary anastamosis of the recto-sigmoid with two donuts removed following anastamosis  Final pathology revealed: Final Diagnosis  FSA, A: Omentum, partial omentectomy  - Extensive metastatic serous carcinoma  B: Uterus, cervix, bilateral tubes and ovaries and colon, hysterectomy, bilateral salpingo-oophorectomy and partial colectomy  - High grade serous  carcinoma, see synoptic report below  C: Colon, anastomotic rings, excision  - Colonic mucosa and wall negative for malignancy   Port a cath was placed at [facility] on  09/09/15.       Interval History:  She is overall doing very well. She had a CT scan post cycle number 6. It revealed: IMPRESSION: 1. Although the recent oncology notes indicate that at the time of completion of surgery the patient had residual tumor along the small bowel mesentery and diaphragmatic and peritoneal nodularity, this tumor deposition seems much less notable on today's exam. There is some trace edema along the paracolic gutters and a small nodule in the adipose tissue above the transverse colon, but the diffuse peritoneal disease described at the time of completion of surgery is no longer present indicating a good response to therapy. 2. Small right pleural effusion, nonspecific. 3. Abnormally thickened appendix at 1.2 cm. This could be from low grade inflammation, tumor deposition along the appendix, or a small appendiceal mass. I doubt that this is a mucocele given the lack of low-density. 4. Trace ascites along Morrison's pouch. Trace ascites below the spleen. 5. Prominent stool throughout the colon favors constipation.  She had additional 2 cycles of carboplatin taxotere thru 02-27-16. CT revealed: IMPRESSION: 1. Similar appearance to the prior exam, without well-defined tumor nodularity. Trace ascites along Randol Kern' s pouch in below the liver do just like before. Tiny nodule in the adipose tissue above the transverse colon appears stable. 2. Trace right pleural effusion. 3. Borderline prominence of stool in the distal colon. 4. The AP diameter of the vagina is mildly thickened at 2.1 cm for the double wall thickness. This is stable and now that we are further out postoperatively I doubt that this is all postoperative swelling. I suspect that this is benign/incidental as there is no focal thickening at the vaginal cuff, but this may merit observation.  The patient had undergone a supracervical hysterectomy so the thickening at the vaginal cuff is not worrisome.  She just  received her first cycle of Taxotere carboplatin with Avastin. Her CA-125 was not drawn on the day of her infusion. A few days after lengthy continue to go up and is 122. I discussed with her that the most important CA-125 will be the one prior to cycle #2 and hopefully will be coming down. If it does not start to decline she understands that were dealing with platinum and taxine resistant disease and we'll need to change chemotherapeutic agents. She had an issue with her Neulasta automatic pump that is well outlined in Dr. Mariana Kaufman note. A rebounded with subcutaneous Neulasta. She scheduled for her next cycle of chemotherapy April 20. She's planning on going out of town for her birthday she and her sister going to [place] the first week in [place]. She is otherwise states that this cycle slow but Estring that was less nausea.  Current Meds:  Outpatient Encounter Prescriptions as of 04/11/2016  Medication Sig  . cefUROXime (CEFTIN) 250 MG tablet Take 1 tablet (250 mg total) by mouth 2 (two) times daily with a meal.  . dexamethasone (DECADRON) 4 MG tablet Take 2 tabs (8 mg) by mouth 2 times daily for 3 days beginning the day prior to taxotere.  . ferrous fumarate (HEMOCYTE - 106 MG FE) 325 (106 FE) MG TABS tablet Take 1 tablet daily on an empty stomach with OJ.  . gabapentin (NEURONTIN) 100 MG capsule Take 1 capsule (100 mg total) by mouth  at bedtime.  . hydrocortisone (ANUSOL-HC) 2.5 % rectal cream Place 1 application rectally 2 (two) times daily as needed for hemorrhoids or itching.  Marland Kitchen HYDROmorphone (DILAUDID) 2 MG tablet 1 tablet every 4-6 hours as needed for pain  . ibuprofen (ADVIL,MOTRIN) 200 MG tablet Take 600 mg by mouth every 8 (eight) hours as needed. Reported on 04/05/2016  . lidocaine-prilocaine (EMLA) cream Apply 1 application topically as needed.  Marland Kitchen LORazepam (ATIVAN) 1 MG tablet Take 1 tablet (1 mg total) by mouth every 8 (eight) hours. for nausea.  . Multiple Vitamins-Minerals  (MULTI-VITAMIN GUMMIES) CHEW Chew by mouth daily.  Marland Kitchen nystatin (MYCOSTATIN/NYSTOP) 100000 UNIT/GM POWD Apply to skin after drying twice a day as directed  . omeprazole (PRILOSEC) 10 MG capsule Take 10 mg by mouth daily as needed.  . ondansetron (ZOFRAN) 8 MG tablet Take 1 tablet (8 mg total) by mouth every 8 (eight) hours as needed for nausea or vomiting.  Marland Kitchen PROAIR HFA 108 (90 Base) MCG/ACT inhaler Inhale 1 puff into the lungs 2 (two) times daily as needed.  . prochlorperazine (COMPAZINE) 10 MG tablet Take 1 tablet (10 mg total) by mouth every 6 (six) hours as needed.   No facility-administered encounter medications on file as of 04/11/2016.    Allergy:  Allergies  Allergen Reactions  . Codeine Palpitations    Described by patient as "horrible panic attack feeling and shortness of breath"    Social Hx:   Social History   Social History  . Marital Status: Legally Separated    Spouse Name: N/A  . Number of Children: N/A  . Years of Education: N/A   Occupational History  . Not on file.   Social History Main Topics  . Smoking status: Never Smoker   . Smokeless tobacco: Never Used     Comment: some hookah in college  . Alcohol Use: Yes     Comment: 1-2 nights week socially  . Drug Use: No  . Sexual Activity: No   Other Topics Concern  . Not on file   Social History Narrative    Past Surgical Hx:  Past Surgical History  Procedure Laterality Date  . Wisdom tooth extraction  2013    Past Medical Hx:  Past Medical History  Diagnosis Date  . Allergy   . Asthma     exercise induced    Family Hx:  Family History  Problem Relation Age of Onset  . Diabetes Father   . Congestive Heart Failure Father   . Atrial fibrillation Father   . Crohn's disease Maternal Grandmother   . Breast cancer Paternal Grandmother     dx. 57s  . Skin cancer Paternal Uncle     unspecified type; dx. 50 or younger  . Heart attack Paternal Grandfather     Vitals:  Blood pressure 118/74,  pulse 87, temperature 98.3 F (36.8 C), temperature source Oral, resp. rate 18, height 5' 5" (1.651 m), weight 253 lb 12.8 oz (115.123 kg), SpO2 99 %.  Physical Exam:  General: Well developed, well nourished female in no acute distress. Alert and oriented x 3.   Abdomen: Soft, non-tender and obese. Active bowel sounds in all quadrants. Well-healed vertical midline incision.  Pelvic: Normal female genitalia. Bimanual examination was a palpably normal cervix. Cervix is freely mobile, nontender, there is no nodularity. Rectovaginal confirms.   Assessment/Plan:  30 year old s/p exploratory laparotomy with supracervical hysterectomy, bilateral salpingoophorectomy, omentectomy, peritoneal stripping, resection of bladder nodule, rectosigmoid resection with mobilization of  the splenic flexure and primary end-to-end anastamosis (R2 resection)  on 08/31/15 for high grade serous carcinoma of the ovary.    First cycle of chemotherapy on 09/26/15.  Her CA-125 was coming down very nicely, but is starting to rise. Bevacizumab was added with her last cycle. We'll need to see what this does to her CO2 5. However, if it does not begin to decline with the addition of bevacizumab we will need to consider her truly platinum and taxine resistant and will need to go dissected anterior medications. She also may be eligible for some clinical trials based on her genetic profile. BRCA negative.   Nancy Marus A., MD 04/11/2016, 3:38 PM

## 2016-04-17 ENCOUNTER — Other Ambulatory Visit: Payer: Self-pay | Admitting: Oncology

## 2016-04-17 ENCOUNTER — Telehealth: Payer: Self-pay

## 2016-04-17 DIAGNOSIS — C569 Malignant neoplasm of unspecified ovary: Secondary | ICD-10-CM

## 2016-04-17 NOTE — Telephone Encounter (Signed)
-----   Message from Gordy Levan, MD sent at 04/17/2016  8:58 AM EDT ----- For chemo 4-20  On pro injector did not function last cycle. Please ask her if she prefers to come to [facility] for injection, otherwise call us promptly if any concerns with the injector.  (timing of on pro is best as it administers day after chemo - when it works correctly; neulasta at [facility] would need to be 4-22)  Please ask if she would like IVF 4-21 or 4-22. Let me know to put in orders if so.  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

## 2016-04-17 NOTE — Telephone Encounter (Signed)
lvm per Dr Edwyna Shell attached message re onpro vs injection and desire for IVF. Pt to call us back

## 2016-04-17 NOTE — Telephone Encounter (Signed)
Pt called back and stated she wants to try the onpro again this time. She only had problems the once with onpro. She has been able to keep hydrated and feels she does not need IVF

## 2016-04-17 NOTE — Telephone Encounter (Signed)
-----   Message from Gordy Levan, MD sent at 04/17/2016  8:58 AM EDT ----- For chemo 4-20  On pro injector did not function last cycle. Please ask her if she prefers to come to [facility] for injection, otherwise call us promptly if any concerns with the injector.  (timing of on pro is best as it administers day after chemo - when it works correctly; neulasta at [facility] would need to be 4-22)  Please ask if she would like IVF 4-21 or 4-22. Let me know to put in orders if so.  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

## 2016-04-18 ENCOUNTER — Other Ambulatory Visit: Payer: Self-pay | Admitting: Oncology

## 2016-04-19 ENCOUNTER — Ambulatory Visit (HOSPITAL_BASED_OUTPATIENT_CLINIC_OR_DEPARTMENT_OTHER): Payer: BC Managed Care – PPO

## 2016-04-19 ENCOUNTER — Other Ambulatory Visit (HOSPITAL_BASED_OUTPATIENT_CLINIC_OR_DEPARTMENT_OTHER): Payer: BC Managed Care – PPO

## 2016-04-19 ENCOUNTER — Other Ambulatory Visit: Payer: Self-pay | Admitting: Oncology

## 2016-04-19 VITALS — BP 115/69 | HR 87 | Temp 98.8°F | Resp 16

## 2016-04-19 DIAGNOSIS — C562 Malignant neoplasm of left ovary: Secondary | ICD-10-CM

## 2016-04-19 DIAGNOSIS — D701 Agranulocytosis secondary to cancer chemotherapy: Secondary | ICD-10-CM

## 2016-04-19 DIAGNOSIS — Z5111 Encounter for antineoplastic chemotherapy: Secondary | ICD-10-CM | POA: Diagnosis not present

## 2016-04-19 DIAGNOSIS — C561 Malignant neoplasm of right ovary: Secondary | ICD-10-CM

## 2016-04-19 DIAGNOSIS — Z5112 Encounter for antineoplastic immunotherapy: Secondary | ICD-10-CM

## 2016-04-19 DIAGNOSIS — C569 Malignant neoplasm of unspecified ovary: Secondary | ICD-10-CM

## 2016-04-19 LAB — COMPREHENSIVE METABOLIC PANEL
ALT: 16 U/L (ref 0–55)
ANION GAP: 9 meq/L (ref 3–11)
AST: 16 U/L (ref 5–34)
Albumin: 3.9 g/dL (ref 3.5–5.0)
Alkaline Phosphatase: 99 U/L (ref 40–150)
BUN: 17.2 mg/dL (ref 7.0–26.0)
CALCIUM: 9.9 mg/dL (ref 8.4–10.4)
CHLORIDE: 109 meq/L (ref 98–109)
CO2: 23 meq/L (ref 22–29)
Creatinine: 0.7 mg/dL (ref 0.6–1.1)
EGFR: >90 mL/min/{1.73_m2} (ref >90)
Glucose: 95 mg/dl (ref 70–140)
POTASSIUM: 4.2 meq/L (ref 3.5–5.1)
Sodium: 140 mEq/L (ref 136–145)
Total Bilirubin: <0.3 mg/dL (ref 0.20–1.20)
Total Protein: 7.2 g/dL (ref 6.4–8.3)

## 2016-04-19 LAB — CBC WITH DIFFERENTIAL/PLATELET
BASO%: 0.4 % (ref 0.0–2.0)
Basophils Absolute: 0 10*3/uL (ref 0.0–0.1)
EOS%: 0.1 % (ref 0.0–7.0)
Eosinophils Absolute: 0 10*3/uL (ref 0.0–0.5)
HEMATOCRIT: 38.1 % (ref 34.8–46.6)
HEMOGLOBIN: 12.7 g/dL (ref 11.6–15.9)
LYMPH#: 1.5 10*3/uL (ref 0.9–3.3)
LYMPH%: 14.1 % (ref 14.0–49.7)
MCH: 32 pg (ref 25.1–34.0)
MCHC: 33.4 g/dL (ref 31.5–36.0)
MCV: 95.8 fL (ref 79.5–101.0)
MONO#: 0.6 10*3/uL (ref 0.1–0.9)
MONO%: 5.8 % (ref 0.0–14.0)
NEUT#: 8.3 10*3/uL — ABNORMAL HIGH (ref 1.5–6.5)
NEUT%: 79.6 % — AB (ref 38.4–76.8)
PLATELETS: 311 10*3/uL (ref 145–400)
RBC: 3.98 10*6/uL (ref 3.70–5.45)
RDW: 15.5 % — AB (ref 11.2–14.5)
WBC: 10.4 10*3/uL — ABNORMAL HIGH (ref 3.9–10.3)

## 2016-04-19 LAB — UA PROTEIN, DIPSTICK - CHCC: PROTEIN: NEGATIVE mg/dL

## 2016-04-19 MED ORDER — SODIUM CHLORIDE 0.9% FLUSH
10.0000 mL | INTRAVENOUS | Status: DC | PRN
  Administered 2016-04-19: 10 mL
  Filled 2016-04-19: qty 10

## 2016-04-19 MED ORDER — SODIUM CHLORIDE 0.9 % IV SOLN
750.0000 mg | Freq: Once | INTRAVENOUS | Status: AC
  Administered 2016-04-19: 750 mg via INTRAVENOUS
  Filled 2016-04-19: qty 75

## 2016-04-19 MED ORDER — PALONOSETRON HCL INJECTION 0.25 MG/5ML
INTRAVENOUS | Status: AC
  Filled 2016-04-19: qty 5

## 2016-04-19 MED ORDER — SODIUM CHLORIDE 0.9 % IV SOLN
Freq: Once | INTRAVENOUS | Status: AC
  Administered 2016-04-19: 14:00:00 via INTRAVENOUS

## 2016-04-19 MED ORDER — DOCETAXEL CHEMO INJECTION 160 MG/16ML: 60.0000 mg/m2 | Freq: Once | INTRAVENOUS | Status: DC

## 2016-04-19 MED ORDER — PALONOSETRON HCL INJECTION 0.25 MG/5ML
0.2500 mg | Freq: Once | INTRAVENOUS | Status: AC
  Administered 2016-04-19: 0.25 mg via INTRAVENOUS

## 2016-04-19 MED ORDER — CARBOPLATIN CHEMO INTRADERMAL TEST DOSE 100MCG/0.02ML
100.0000 ug | Freq: Once | INTRADERMAL | Status: AC
  Administered 2016-04-19: 100 ug via INTRADERMAL
  Filled 2016-04-19: qty 0.01

## 2016-04-19 MED ORDER — HEPARIN SOD (PORK) LOCK FLUSH 100 UNIT/ML IV SOLN
500.0000 [IU] | Freq: Once | INTRAVENOUS | Status: AC | PRN
  Administered 2016-04-19: 500 [IU]
  Filled 2016-04-19: qty 5

## 2016-04-19 MED ORDER — SODIUM CHLORIDE 0.9 % IV SOLN
Freq: Once | INTRAVENOUS | Status: AC
  Administered 2016-04-19: 14:00:00 via INTRAVENOUS
  Filled 2016-04-19: qty 5

## 2016-04-19 MED ORDER — SODIUM CHLORIDE 0.9 % IV SOLN
15.0000 mg/kg | Freq: Once | INTRAVENOUS | Status: AC
  Administered 2016-04-19: 1750 mg via INTRAVENOUS
  Filled 2016-04-19: qty 56

## 2016-04-19 MED ORDER — SODIUM CHLORIDE 0.9 % IV SOLN
60.0000 mg/m2 | Freq: Once | INTRAVENOUS | Status: AC
  Administered 2016-04-19: 140 mg via INTRAVENOUS
  Filled 2016-04-19: qty 14

## 2016-04-19 MED ORDER — PEGFILGRASTIM 6 MG/0.6ML ~~LOC~~ PSKT
6.0000 mg | PREFILLED_SYRINGE | Freq: Once | SUBCUTANEOUS | Status: AC
  Administered 2016-04-19: 6 mg via SUBCUTANEOUS
  Filled 2016-04-19: qty 0.6

## 2016-04-19 NOTE — Patient Instructions (Signed)
[place] Discharge Instructions for Patients Receiving Chemotherapy  Today you received the following chemotherapy agents :  Avastin, Taxotere, Carboplatin.  To help prevent nausea and vomiting after your treatment, we encourage you to take your nausea medication as prescribed.   If you develop nausea and vomiting that is not controlled by your nausea medication, call the clinic.   BELOW ARE SYMPTOMS THAT SHOULD BE REPORTED IMMEDIATELY:  *FEVER GREATER THAN 100.5 F  *CHILLS WITH OR WITHOUT FEVER  NAUSEA AND VOMITING THAT IS NOT CONTROLLED WITH YOUR NAUSEA MEDICATION  *UNUSUAL SHORTNESS OF BREATH  *UNUSUAL BRUISING OR BLEEDING  TENDERNESS IN MOUTH AND THROAT WITH OR WITHOUT PRESENCE OF ULCERS  *URINARY PROBLEMS  *BOWEL PROBLEMS  UNUSUAL RASH Items with * indicate a potential emergency and should be followed up as soon as possible.  Feel free to call the clinic you have any questions or concerns. The clinic phone number is [phone].  Please show the [place] at check-in to the Emergency Department and triage nurse.

## 2016-04-20 ENCOUNTER — Other Ambulatory Visit: Payer: Self-pay | Admitting: Oncology

## 2016-04-20 LAB — CA 125: CANCER ANTIGEN (CA) 125: 62.9 U/mL — AB (ref 0.0–38.1)

## 2016-04-24 ENCOUNTER — Telehealth: Payer: Self-pay

## 2016-04-24 NOTE — Telephone Encounter (Signed)
Ms Fausnaugh called requesting CA-125 results.  Told her marker was down to 62.9.  Dr. Marko Plume will discuss further with her at her visit on Thursday 04-26-16.

## 2016-04-25 ENCOUNTER — Other Ambulatory Visit: Payer: Self-pay | Admitting: Oncology

## 2016-04-26 ENCOUNTER — Encounter: Payer: Self-pay | Admitting: Oncology

## 2016-04-26 ENCOUNTER — Other Ambulatory Visit (HOSPITAL_BASED_OUTPATIENT_CLINIC_OR_DEPARTMENT_OTHER): Payer: BC Managed Care – PPO

## 2016-04-26 ENCOUNTER — Ambulatory Visit (HOSPITAL_BASED_OUTPATIENT_CLINIC_OR_DEPARTMENT_OTHER): Payer: BC Managed Care – PPO | Admitting: Oncology

## 2016-04-26 VITALS — BP 101/87 | HR 110 | Temp 98.5°F | Resp 19 | Ht 65.0 in | Wt 253.3 lb

## 2016-04-26 DIAGNOSIS — C569 Malignant neoplasm of unspecified ovary: Secondary | ICD-10-CM

## 2016-04-26 DIAGNOSIS — D6481 Anemia due to antineoplastic chemotherapy: Secondary | ICD-10-CM

## 2016-04-26 DIAGNOSIS — C561 Malignant neoplasm of right ovary: Secondary | ICD-10-CM

## 2016-04-26 DIAGNOSIS — D701 Agranulocytosis secondary to cancer chemotherapy: Secondary | ICD-10-CM

## 2016-04-26 DIAGNOSIS — C772 Secondary and unspecified malignant neoplasm of intra-abdominal lymph nodes: Secondary | ICD-10-CM

## 2016-04-26 DIAGNOSIS — Z23 Encounter for immunization: Secondary | ICD-10-CM

## 2016-04-26 DIAGNOSIS — C7989 Secondary malignant neoplasm of other specified sites: Secondary | ICD-10-CM

## 2016-04-26 DIAGNOSIS — T451X5A Adverse effect of antineoplastic and immunosuppressive drugs, initial encounter: Secondary | ICD-10-CM

## 2016-04-26 DIAGNOSIS — Z95828 Presence of other vascular implants and grafts: Secondary | ICD-10-CM

## 2016-04-26 DIAGNOSIS — C562 Malignant neoplasm of left ovary: Secondary | ICD-10-CM

## 2016-04-26 DIAGNOSIS — G620 Drug-induced polyneuropathy: Secondary | ICD-10-CM

## 2016-04-26 DIAGNOSIS — R112 Nausea with vomiting, unspecified: Secondary | ICD-10-CM

## 2016-04-26 LAB — CBC WITH DIFFERENTIAL/PLATELET
BASO%: 0.4 % (ref 0.0–2.0)
BASOS ABS: 0 10*3/uL (ref 0.0–0.1)
EOS%: 0.2 % (ref 0.0–7.0)
Eosinophils Absolute: 0 10*3/uL (ref 0.0–0.5)
HEMATOCRIT: 36.1 % (ref 34.8–46.6)
HEMOGLOBIN: 12 g/dL (ref 11.6–15.9)
LYMPH#: 2.3 10*3/uL (ref 0.9–3.3)
LYMPH%: 26.3 % (ref 14.0–49.7)
MCH: 31.6 pg (ref 25.1–34.0)
MCHC: 33.3 g/dL (ref 31.5–36.0)
MCV: 94.9 fL (ref 79.5–101.0)
MONO#: 1.2 10*3/uL — AB (ref 0.1–0.9)
MONO%: 13.6 % (ref 0.0–14.0)
NEUT#: 5.1 10*3/uL (ref 1.5–6.5)
NEUT%: 59.5 % (ref 38.4–76.8)
PLATELETS: 180 10*3/uL (ref 145–400)
RBC: 3.8 10*6/uL (ref 3.70–5.45)
RDW: 15.2 % — AB (ref 11.2–14.5)
WBC: 8.7 10*3/uL (ref 3.9–10.3)

## 2016-04-26 LAB — COMPREHENSIVE METABOLIC PANEL
ALT: 28 U/L (ref 0–55)
AST: 16 U/L (ref 5–34)
Albumin: 4.1 g/dL (ref 3.5–5.0)
Alkaline Phosphatase: 107 U/L (ref 40–150)
Anion Gap: 9 mEq/L (ref 3–11)
BUN: 11.1 mg/dL (ref 7.0–26.0)
CALCIUM: 10 mg/dL (ref 8.4–10.4)
CHLORIDE: 101 meq/L (ref 98–109)
CO2: 27 meq/L (ref 22–29)
CREATININE: 0.8 mg/dL (ref 0.6–1.1)
EGFR: >90 mL/min/{1.73_m2} (ref >90)
Glucose: 98 mg/dl (ref 70–140)
Potassium: 4.1 mEq/L (ref 3.5–5.1)
Sodium: 138 mEq/L (ref 136–145)
TOTAL PROTEIN: 6.9 g/dL (ref 6.4–8.3)
Total Bilirubin: 0.3 mg/dL (ref 0.20–1.20)

## 2016-04-26 LAB — UA PROTEIN, DIPSTICK - CHCC: Protein, ur: <30 mg/dL

## 2016-04-26 MED ORDER — HYDROMORPHONE HCL 2 MG PO TABS: ORAL_TABLET | ORAL | Status: DC

## 2016-04-26 MED ORDER — LORAZEPAM 1 MG PO TABS: 1.0000 mg | ORAL_TABLET | Freq: Three times a day (TID) | ORAL | Status: DC

## 2016-04-26 NOTE — Progress Notes (Signed)
OFFICE PROGRESS NOTE   April 26, 2016   Physicians:Paola Lorre Nick, MD (PCP [facility]) , [name]  INTERVAL HISTORY:  Patient is seen, alone for visit, in continuing attention to treatment ongoing for IVA high grade serous carcinoma of bilateral ovaries. She had cycle 2 avastin/ cycle 4 carbo taxotere on 4-20-1 7, with on pro neulasta. She has [place] skin testing prior to each carboplatin now.  She saw Dr Alycia Rossetti on 04-11-16 with review of most recent CT, thickening at vaginal cuff is supracervical hysterectomy. Repeat CA 125 on 04-19-16 down to 62 since start of avastin, this having been 122 on 04-05-16 and 93 on 03-12-16.   Patient did not have fever despite marked neutropenia previous cycle when on pro neulasta did not function. ANC was up to 16.3 by 04-09-16 (after neulasta on 4-7 when ANC 0.1). WBC was 10.4 day of chemo 4-20, which may have contributed to aches with neulasta then. Patient did have increased aches with most recent cycle (note on pro neulasta functioned correctly this time) as well as more nausea, difficult to eat or drink up until last 1-2 days.. Bowels are not moving well yet, has used only stool softener, will try miralax or senokot also. She has had some increased low back pain (may be neulasta aches), no bladder symptoms. She has had minimal blood specks from nose, no frank bleeding. She denies increased SOB. No increased peripheral neuropathy. No problems with PAC. No LE swelling. 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 Remainder of 10 point Review of Systems negative.    Power PAC placed at [facility] 09-09-15 Flu vaccine 10-27-15 Genetics testing 10-27-15 normal ([facility] panel). 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  She and sister are going to [facility] May 5-6-7.   ONCOLOGIC HISTORY Patient initially developed constipation and abdominal pain summer 2016 which she thought was stress related,  then worsening abdominal and pelvic pain, some back pain and some fever; she had weight gain ~ 30 lbs in prior 5-6 months. She was seen at [facility] with concern for acute appendicitis. She was evaluated at local ED, I believe [facility], with CT reportedly showing large left and small right pleural effusions, moderate ascites, complex septated cystic mass in low pelvis into left abdomen 13 x 7 x 7 cm, adenopathy Including large diaphragmatic lymph nodes anterior to heart and numerous mesenteric nodes, and apparent carcinomatosis with implants in omentum up to 3.8 cm. She was seen by Dr [name] and referred to Dr Alycia Rossetti. At Dr Elenora Gamma exam 08-31-15 there was large mass in cul de sac and abdominal fluid wave, decreased BS left lower 1/2 of chest. Lab studies included CA 125 33,145; inhibin B <10; CEA <0.5, AFP 2.2, LDH 1281 and HCG negative. Surgery by Dr Alycia Rossetti at [facility] on 09-06-15 was exploratory laparotomy with supracervical hysterectomy, BSO, omentectomy, resection with primary reanastomosis of rectosigmoid and peritoneal stripping. Intraoperative findings included 3 liters of bloody ascites, 12 cm omental mass adherent to uterus, sigmoid colon and rectum which was resected en bloc, bladder nodule resected. At completion of surgery there was residual tumor along entirety of small bowel mesentery with multiple nodules up to 1.5 cm, miliary disease on diaphragm, and peritoneal nodularity along pelvis and rectum (R2 resection). Pathology [facility] [id number] high grade serous carcinoma of bilateral ovaries involving bilateral tubes, uterine wall anterior and posterior and lower uterine segment, omentum, colon.  No nodes submitted.  She was transfused 2 units PRBCs on each 9-9 and 09-13-15 for hemoglobin as low as 6.8 - 7.1. She had urgent right thoracentesis for 750 cc on 09-08-15 with cytology positive for high grade serous carcinoma ([id number]) and left thoracentesis for 400 cc on 09-09-15. CT  angio chest on POD 1 was negative for PE. She tolerated morphine by PCA post operatively. She had mild cellulitis at abdominal incision on day of DC, begun on Keflex then. Psychiatry saw in hospital, begun on zoloft. She was discharged home with 28 days of lovenox. She saw Dr Alycia Rossetti on 09-19-15 with staples removed, however wound opened that evening. She was seen by gyn onc provider on 09-23-15, with wound open 3 x 5 cm, 3 cm deep, no evidence of infection. Wet to dry dressings bid were begun, ongoing. She had follow up wound check on 09-27-15, no findings of concern. GOG protocol at [facility] had delay, so that treatment given off protocol with first cycle of carboplatin taxol at [facility] on 09-26-15. She had additional 5 cycles of carboplatin taxol at [facility] thru 12-19-15. Restaging CT CAP showed no diffuse peritoneal disease remaining, small ascites and small right pleural effusion, thickened retrocecal appendix, right external iliac node 0.7 cm. Chemo continued with 2 cycles of carboplatin taxotere thru 02-27-16.   Objective:  Vital signs in last 24 hours:  BP 101/87 mmHg  Pulse 110  Temp(Src) 98.5 F (36.9 C) (Oral)  Resp 19  Ht 5' 5" (1.651 m)  Wt 253 lb 4.8 oz (114.896 kg)  BMI 42.15 kg/m2  SpO2 99% Weight down 3 lbs. Alert, oriented and appropriate. Ambulatory without assistance.  Alopecia  HEENT:PERRL, sclerae not icteric. Oral mucosa moist without lesions, posterior pharynx clear.  Neck supple. No JVD.  Lymphatics:no cervical,suraclavicular, axillary or inguinal adenopathy Resp: clear to auscultation bilaterally and normal percussion bilaterally Cardio: regular rate and rhythm. No gallop. GI: soft, nontender, not distended, no mass or organomegaly. Normally active bowel sounds. Surgical incision not remarkable. Musculoskeletal/ Extremities: without pitting edema, cords, tenderness Neuro: no peripheral neuropathy. Otherwise nonfocal Skin without rash, ecchymosis, petechiae Breasts: without  dominant mass, skin or nipple findings. Axillae benign. Portacath-without erythema or tenderness  Lab Results:  Results for orders placed or performed in visit on 04/26/16  CBC with Differential  Result Value Ref Range   WBC 8.7 3.9 - 10.3 10e3/uL   NEUT# 5.1 1.5 - 6.5 10e3/uL   HGB 12.0 11.6 - 15.9 g/dL   HCT 36.1 34.8 - 46.6 %   Platelets 180 145 - 400 10e3/uL   MCV 94.9 79.5 - 101.0 fL   MCH 31.6 25.1 - 34.0 pg   MCHC 33.3 31.5 - 36.0 g/dL   RBC 3.80 3.70 - 5.45 10e6/uL   RDW 15.2 (H) 11.2 - 14.5 %   lymph# 2.3 0.9 - 3.3 10e3/uL   MONO# 1.2 (H) 0.1 - 0.9 10e3/uL   Eosinophils Absolute 0.0 0.0 - 0.5 10e3/uL   Basophils Absolute 0.0 0.0 - 0.1 10e3/uL   NEUT% 59.5 38.4 - 76.8 %   LYMPH% 26.3 14.0 - 49.7 %   MONO% 13.6 0.0 - 14.0 %   EOS% 0.2 0.0 - 7.0 %   BASO% 0.4 0.0 - 2.0 %  Comprehensive metabolic panel  Result Value Ref Range   Sodium 138 136 - 145 mEq/L   Potassium 4.1 3.5 - 5.1 mEq/L   Chloride 101 98 - 109 mEq/L   CO2 27 22 - 29 mEq/L   Glucose  98 70 - 140 mg/dl   BUN 11.1 7.0 - 26.0 mg/dL   Creatinine 0.8 0.6 - 1.1 mg/dL   Total Bilirubin 0.30 0.20 - 1.20 mg/dL   Alkaline Phosphatase 107 40 - 150 U/L   AST 16 5 - 34 U/L   ALT 28 0 - 55 U/L   Total Protein 6.9 6.4 - 8.3 g/dL   Albumin 4.1 3.5 - 5.0 g/dL   Calcium 10.0 8.4 - 10.4 mg/dL   Anion Gap 9 3 - 11 mEq/L   EGFR >90 >90 ml/min/1.73 m2  Urine protein by dipstick  Result Value Ref Range   Protein, ur < 30 Negative- <30 mg/dL     Studies/Results:  No results found.  Medications: I have reviewed the patient's current medications. Add miralax or senokot to stool softener now. Use saline nose spray regularly with slight dried blood noted.  DISCUSSION Recommended additional IVF day 2 or day 3 next cycle due to nausea.  Improvement in marker discussed, hopefully will continue with avastin in addition to chemo. She understands that regimen will be changed if she becomes resistant to platinum/ taxane.    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 Malignant pleural fluid and ascites at presentation. Persistent disease after 6 cycles carbo taxol, changed to [place] taxotere due to neuropathy, avastin added beginning 03-26-16. CT AP 03-16-16 stable. CA 125 lower day of cycle 2 avastin. She will have cycle 3 avastin with cycle 5 carbo taxotere on 05-10-16, with on pro neulasta and IVF on day 2 or day 3. Will repeat CA 125 day of chemo. Genetics testing negative for germ line BRCA mutation.  Foundation One sent from [facility] on surgical path as above, not eligible for related trials now but may be option in future. 2.increased nausea despite same emend and aloxi. Will add IVF with IV antiemetics after next chemo 3. Constipation: add miralax or senokot to present stool softeners 5.power PAC  6.chemo neutropenia, including cycle 3 carbo taxotere when on pro neulasta did not function. Resolved.  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 8.chemo neuropathy: taxane changed to taxotere. On gabapentin. Symptoms improved. 9.mild chemo anemia, iron studies ok 09-30-15. Improved on supplemental ferrous fumarate, continue 10.obesity: BMI 42 11.Surgical menopause with hot flashes 14. Flu vaccine 10-10-15   All questions answered. Chemo, IVF/ IV antiemetics, on pro neulasta orders entered and confirmed. Time spent 25 min including >50% counseling and coordination of care. Route PCP, cc with CA 125 to Dr Cammie Mcgee, MD   04/26/2016, 8:36 PM

## 2016-04-27 ENCOUNTER — Telehealth: Payer: Self-pay | Admitting: Oncology

## 2016-04-27 NOTE — Telephone Encounter (Signed)
sw. pt and advised on May and June appt....pt ok and aware and will ck my chart

## 2016-04-28 ENCOUNTER — Other Ambulatory Visit: Payer: Self-pay | Admitting: Oncology

## 2016-04-28 DIAGNOSIS — C569 Malignant neoplasm of unspecified ovary: Secondary | ICD-10-CM

## 2016-04-28 MED ORDER — SODIUM CHLORIDE 0.9 % IV SOLN: INTRAVENOUS | Status: DC

## 2016-04-28 MED ORDER — SODIUM CHLORIDE 0.9 % IV SOLN: 10.0000 mg | Freq: Once | INTRAVENOUS | Status: DC

## 2016-04-28 MED ORDER — PROCHLORPERAZINE EDISYLATE 5 MG/ML IJ SOLN
10.0000 mg | Freq: Once | INTRAMUSCULAR | Status: DC
Start: 2016-05-12 — End: 2016-04-28

## 2016-04-28 MED ORDER — PANTOPRAZOLE SODIUM 40 MG IV SOLR
40.0000 mg | Freq: Once | INTRAVENOUS | Status: DC
Start: 2016-05-12 — End: 2016-04-28

## 2016-05-10 ENCOUNTER — Other Ambulatory Visit: Payer: Self-pay | Admitting: *Deleted

## 2016-05-10 ENCOUNTER — Other Ambulatory Visit (HOSPITAL_BASED_OUTPATIENT_CLINIC_OR_DEPARTMENT_OTHER): Payer: BC Managed Care – PPO

## 2016-05-10 ENCOUNTER — Ambulatory Visit (HOSPITAL_BASED_OUTPATIENT_CLINIC_OR_DEPARTMENT_OTHER): Payer: BC Managed Care – PPO

## 2016-05-10 ENCOUNTER — Ambulatory Visit: Payer: BC Managed Care – PPO

## 2016-05-10 VITALS — BP 124/76 | HR 73 | Temp 97.8°F

## 2016-05-10 DIAGNOSIS — Z95828 Presence of other vascular implants and grafts: Secondary | ICD-10-CM

## 2016-05-10 DIAGNOSIS — Z5112 Encounter for antineoplastic immunotherapy: Secondary | ICD-10-CM | POA: Diagnosis not present

## 2016-05-10 DIAGNOSIS — C569 Malignant neoplasm of unspecified ovary: Secondary | ICD-10-CM

## 2016-05-10 DIAGNOSIS — C562 Malignant neoplasm of left ovary: Secondary | ICD-10-CM | POA: Diagnosis not present

## 2016-05-10 DIAGNOSIS — Z5111 Encounter for antineoplastic chemotherapy: Secondary | ICD-10-CM | POA: Diagnosis not present

## 2016-05-10 DIAGNOSIS — D701 Agranulocytosis secondary to cancer chemotherapy: Secondary | ICD-10-CM | POA: Diagnosis not present

## 2016-05-10 DIAGNOSIS — C561 Malignant neoplasm of right ovary: Secondary | ICD-10-CM | POA: Diagnosis not present

## 2016-05-10 LAB — CBC WITH DIFFERENTIAL/PLATELET
BASO%: 0 % (ref 0.0–2.0)
BASOS ABS: 0 10*3/uL (ref 0.0–0.1)
EOS%: 0 % (ref 0.0–7.0)
Eosinophils Absolute: 0 10*3/uL (ref 0.0–0.5)
HEMATOCRIT: 36.5 % (ref 34.8–46.6)
HGB: 12.2 g/dL (ref 11.6–15.9)
LYMPH#: 1 10*3/uL (ref 0.9–3.3)
LYMPH%: 8.5 % — ABNORMAL LOW (ref 14.0–49.7)
MCH: 32.5 pg (ref 25.1–34.0)
MCHC: 33.4 g/dL (ref 31.5–36.0)
MCV: 97.3 fL (ref 79.5–101.0)
MONO#: 0.7 10*3/uL (ref 0.1–0.9)
MONO%: 6.3 % (ref 0.0–14.0)
NEUT#: 10 10*3/uL — ABNORMAL HIGH (ref 1.5–6.5)
NEUT%: 85.2 % — AB (ref 38.4–76.8)
PLATELETS: 315 10*3/uL (ref 145–400)
RBC: 3.75 10*6/uL (ref 3.70–5.45)
RDW: 15.7 % — ABNORMAL HIGH (ref 11.2–14.5)
WBC: 11.7 10*3/uL — ABNORMAL HIGH (ref 3.9–10.3)

## 2016-05-10 LAB — COMPREHENSIVE METABOLIC PANEL
ALBUMIN: 4.1 g/dL (ref 3.5–5.0)
ALT: 25 U/L (ref 0–55)
AST: 16 U/L (ref 5–34)
Alkaline Phosphatase: 81 U/L (ref 40–150)
Anion Gap: 9 mEq/L (ref 3–11)
BUN: 16.3 mg/dL (ref 7.0–26.0)
CO2: 26 meq/L (ref 22–29)
Calcium: 10.2 mg/dL (ref 8.4–10.4)
Chloride: 107 mEq/L (ref 98–109)
Creatinine: 0.7 mg/dL (ref 0.6–1.1)
EGFR: >90 mL/min/{1.73_m2} (ref >90)
GLUCOSE: 133 mg/dL (ref 70–140)
POTASSIUM: 3.8 meq/L (ref 3.5–5.1)
Sodium: 141 mEq/L (ref 136–145)
TOTAL PROTEIN: 7.3 g/dL (ref 6.4–8.3)

## 2016-05-10 LAB — UA PROTEIN, DIPSTICK - CHCC: Protein, ur: <30 mg/dL

## 2016-05-10 MED ORDER — SODIUM CHLORIDE 0.9% FLUSH
10.0000 mL | INTRAVENOUS | Status: DC | PRN
  Administered 2016-05-10: 10 mL via INTRAVENOUS
  Filled 2016-05-10: qty 10

## 2016-05-10 MED ORDER — SODIUM CHLORIDE 0.9 % IV SOLN
750.0000 mg | Freq: Once | INTRAVENOUS | Status: AC
  Administered 2016-05-10: 750 mg via INTRAVENOUS
  Filled 2016-05-10: qty 60

## 2016-05-10 MED ORDER — HEPARIN SOD (PORK) LOCK FLUSH 100 UNIT/ML IV SOLN
500.0000 [IU] | Freq: Once | INTRAVENOUS | Status: AC | PRN
  Administered 2016-05-10: 500 [IU]
  Filled 2016-05-10: qty 5

## 2016-05-10 MED ORDER — BEVACIZUMAB CHEMO INJECTION 400 MG/16ML
15.0000 mg/kg | Freq: Once | INTRAVENOUS | Status: AC
  Administered 2016-05-10: 1750 mg via INTRAVENOUS
  Filled 2016-05-10: qty 56

## 2016-05-10 MED ORDER — PALONOSETRON HCL INJECTION 0.25 MG/5ML
0.2500 mg | Freq: Once | INTRAVENOUS | Status: AC
  Administered 2016-05-10: 0.25 mg via INTRAVENOUS

## 2016-05-10 MED ORDER — SODIUM CHLORIDE 0.9 % IV SOLN
Freq: Once | INTRAVENOUS | Status: AC
  Administered 2016-05-10: 12:00:00 via INTRAVENOUS

## 2016-05-10 MED ORDER — SODIUM CHLORIDE 0.9 % IV SOLN
Freq: Once | INTRAVENOUS | Status: AC
  Administered 2016-05-10: 14:00:00 via INTRAVENOUS
  Filled 2016-05-10: qty 5

## 2016-05-10 MED ORDER — CARBOPLATIN CHEMO INTRADERMAL TEST DOSE 100MCG/0.02ML
100.0000 ug | Freq: Once | INTRADERMAL | Status: AC
  Administered 2016-05-10: 100 ug via INTRADERMAL
  Filled 2016-05-10: qty 0.01

## 2016-05-10 MED ORDER — PALONOSETRON HCL INJECTION 0.25 MG/5ML
INTRAVENOUS | Status: AC
  Filled 2016-05-10: qty 5

## 2016-05-10 MED ORDER — PEGFILGRASTIM 6 MG/0.6ML ~~LOC~~ PSKT
6.0000 mg | PREFILLED_SYRINGE | Freq: Once | SUBCUTANEOUS | Status: AC
  Administered 2016-05-10: 6 mg via SUBCUTANEOUS
  Filled 2016-05-10: qty 0.6

## 2016-05-10 MED ORDER — SODIUM CHLORIDE 0.9% FLUSH
10.0000 mL | INTRAVENOUS | Status: DC | PRN
  Administered 2016-05-10: 10 mL
  Filled 2016-05-10: qty 10

## 2016-05-10 MED ORDER — DOCETAXEL CHEMO INJECTION 160 MG/16ML
60.0000 mg/m2 | Freq: Once | INTRAVENOUS | Status: AC
  Administered 2016-05-10: 140 mg via INTRAVENOUS
  Filled 2016-05-10: qty 14

## 2016-05-10 NOTE — Patient Instructions (Signed)
[place] Discharge Instructions for Patients Receiving Chemotherapy  Today you received the following chemotherapy agents Avastin/Taxotere/Carboplatin.  To help prevent nausea and vomiting after your treatment, we encourage you to take your nausea medication as prescribed.   If you develop nausea and vomiting that is not controlled by your nausea medication, call the clinic.   BELOW ARE SYMPTOMS THAT SHOULD BE REPORTED IMMEDIATELY:  *FEVER GREATER THAN 100.5 F  *CHILLS WITH OR WITHOUT FEVER  NAUSEA AND VOMITING THAT IS NOT CONTROLLED WITH YOUR NAUSEA MEDICATION  *UNUSUAL SHORTNESS OF BREATH  *UNUSUAL BRUISING OR BLEEDING  TENDERNESS IN MOUTH AND THROAT WITH OR WITHOUT PRESENCE OF ULCERS  *URINARY PROBLEMS  *BOWEL PROBLEMS  UNUSUAL RASH Items with * indicate a potential emergency and should be followed up as soon as possible.  Feel free to call the clinic you have any questions or concerns. The clinic phone number is [phone].  Please show the [place] at check-in to the Emergency Department and triage nurse.

## 2016-05-10 NOTE — Progress Notes (Signed)
Patient took extra decadron pill accidentally last night, Dr. Marko Plume notified and ok to treat.

## 2016-05-10 NOTE — Patient Instructions (Signed)

## 2016-05-11 ENCOUNTER — Other Ambulatory Visit: Payer: Self-pay | Admitting: Oncology

## 2016-05-11 ENCOUNTER — Ambulatory Visit (HOSPITAL_BASED_OUTPATIENT_CLINIC_OR_DEPARTMENT_OTHER): Payer: BC Managed Care – PPO

## 2016-05-11 VITALS — BP 121/71 | HR 65 | Temp 98.5°F | Resp 18

## 2016-05-11 DIAGNOSIS — C562 Malignant neoplasm of left ovary: Secondary | ICD-10-CM

## 2016-05-11 DIAGNOSIS — C561 Malignant neoplasm of right ovary: Secondary | ICD-10-CM | POA: Diagnosis not present

## 2016-05-11 DIAGNOSIS — C569 Malignant neoplasm of unspecified ovary: Secondary | ICD-10-CM

## 2016-05-11 LAB — CA 125: CANCER ANTIGEN (CA) 125: 89 U/mL — AB (ref 0.0–38.1)

## 2016-05-11 MED ORDER — PROCHLORPERAZINE EDISYLATE 5 MG/ML IJ SOLN: 10.0000 mg | Freq: Once | INTRAMUSCULAR | Status: DC | PRN

## 2016-05-11 MED ORDER — FAMOTIDINE IN NACL 20-0.9 MG/50ML-% IV SOLN
20.0000 mg | Freq: Once | INTRAVENOUS | Status: AC
  Administered 2016-05-11: 20 mg via INTRAVENOUS

## 2016-05-11 MED ORDER — SODIUM CHLORIDE 0.9 % IV SOLN
10.0000 mg | Freq: Once | INTRAVENOUS | Status: AC
  Administered 2016-05-11: 10 mg via INTRAVENOUS
  Filled 2016-05-11: qty 1

## 2016-05-11 MED ORDER — SODIUM CHLORIDE 0.9 % IV SOLN
1000.0000 mL | Freq: Once | INTRAVENOUS | Status: AC
  Administered 2016-05-11: 1000 mL via INTRAVENOUS

## 2016-05-11 MED ORDER — HEPARIN SOD (PORK) LOCK FLUSH 100 UNIT/ML IV SOLN
500.0000 [IU] | Freq: Once | INTRAVENOUS | Status: AC
  Administered 2016-05-11: 500 [IU] via INTRAVENOUS
  Filled 2016-05-11: qty 5

## 2016-05-11 MED ORDER — FAMOTIDINE IN NACL 20-0.9 MG/50ML-% IV SOLN
INTRAVENOUS | Status: AC
  Filled 2016-05-11: qty 50

## 2016-05-11 MED ORDER — SODIUM CHLORIDE 0.9% FLUSH
10.0000 mL | INTRAVENOUS | Status: DC | PRN
  Administered 2016-05-11: 10 mL via INTRAVENOUS
  Filled 2016-05-11: qty 10

## 2016-05-14 ENCOUNTER — Telehealth: Payer: Self-pay | Admitting: *Deleted

## 2016-05-14 NOTE — Telephone Encounter (Signed)
Called patient to inform her of CA results. Patient verbalized understanding.

## 2016-05-18 ENCOUNTER — Other Ambulatory Visit: Payer: Self-pay

## 2016-05-20 ENCOUNTER — Other Ambulatory Visit: Payer: Self-pay | Admitting: Oncology

## 2016-05-20 DIAGNOSIS — C569 Malignant neoplasm of unspecified ovary: Secondary | ICD-10-CM

## 2016-05-21 ENCOUNTER — Ambulatory Visit (HOSPITAL_BASED_OUTPATIENT_CLINIC_OR_DEPARTMENT_OTHER): Payer: BC Managed Care – PPO | Admitting: Oncology

## 2016-05-21 ENCOUNTER — Telehealth: Payer: Self-pay | Admitting: Oncology

## 2016-05-21 ENCOUNTER — Other Ambulatory Visit (HOSPITAL_BASED_OUTPATIENT_CLINIC_OR_DEPARTMENT_OTHER): Payer: BC Managed Care – PPO

## 2016-05-21 ENCOUNTER — Ambulatory Visit (HOSPITAL_BASED_OUTPATIENT_CLINIC_OR_DEPARTMENT_OTHER): Payer: BC Managed Care – PPO

## 2016-05-21 ENCOUNTER — Encounter: Payer: Self-pay | Admitting: Oncology

## 2016-05-21 VITALS — BP 113/68 | HR 99 | Temp 98.6°F | Resp 20 | Ht 65.0 in | Wt 255.7 lb

## 2016-05-21 DIAGNOSIS — D6481 Anemia due to antineoplastic chemotherapy: Secondary | ICD-10-CM

## 2016-05-21 DIAGNOSIS — G620 Drug-induced polyneuropathy: Secondary | ICD-10-CM

## 2016-05-21 DIAGNOSIS — Z95828 Presence of other vascular implants and grafts: Secondary | ICD-10-CM

## 2016-05-21 DIAGNOSIS — C562 Malignant neoplasm of left ovary: Secondary | ICD-10-CM

## 2016-05-21 DIAGNOSIS — K5900 Constipation, unspecified: Secondary | ICD-10-CM

## 2016-05-21 DIAGNOSIS — C561 Malignant neoplasm of right ovary: Secondary | ICD-10-CM | POA: Diagnosis not present

## 2016-05-21 DIAGNOSIS — T380X5A Adverse effect of glucocorticoids and synthetic analogues, initial encounter: Secondary | ICD-10-CM

## 2016-05-21 DIAGNOSIS — C569 Malignant neoplasm of unspecified ovary: Secondary | ICD-10-CM

## 2016-05-21 DIAGNOSIS — T451X5A Adverse effect of antineoplastic and immunosuppressive drugs, initial encounter: Secondary | ICD-10-CM

## 2016-05-21 DIAGNOSIS — R112 Nausea with vomiting, unspecified: Secondary | ICD-10-CM

## 2016-05-21 DIAGNOSIS — D701 Agranulocytosis secondary to cancer chemotherapy: Secondary | ICD-10-CM

## 2016-05-21 DIAGNOSIS — R978 Other abnormal tumor markers: Secondary | ICD-10-CM

## 2016-05-21 DIAGNOSIS — G720 Drug-induced myopathy: Secondary | ICD-10-CM

## 2016-05-21 LAB — CBC WITH DIFFERENTIAL/PLATELET
BASO%: 0.1 % (ref 0.0–2.0)
BASOS ABS: 0 10*3/uL (ref 0.0–0.1)
EOS ABS: 0 10*3/uL (ref 0.0–0.5)
EOS%: 0.1 % (ref 0.0–7.0)
HCT: 36.1 % (ref 34.8–46.6)
HEMOGLOBIN: 12.1 g/dL (ref 11.6–15.9)
LYMPH%: 14.7 % (ref 14.0–49.7)
MCH: 32.6 pg (ref 25.1–34.0)
MCHC: 33.5 g/dL (ref 31.5–36.0)
MCV: 97.3 fL (ref 79.5–101.0)
MONO#: 1.2 10*3/uL — ABNORMAL HIGH (ref 0.1–0.9)
MONO%: 7.3 % (ref 0.0–14.0)
NEUT%: 77.8 % — AB (ref 38.4–76.8)
NEUTROS ABS: 12.3 10*3/uL — AB (ref 1.5–6.5)
Platelets: 170 10*3/uL (ref 145–400)
RBC: 3.71 10*6/uL (ref 3.70–5.45)
RDW: 15 % — AB (ref 11.2–14.5)
WBC: 15.8 10*3/uL — ABNORMAL HIGH (ref 3.9–10.3)
lymph#: 2.3 10*3/uL (ref 0.9–3.3)
nRBC: 0 % (ref 0–0)

## 2016-05-21 LAB — BASIC METABOLIC PANEL
ANION GAP: 9 meq/L (ref 3–11)
BUN: 8.8 mg/dL (ref 7.0–26.0)
CALCIUM: 9.7 mg/dL (ref 8.4–10.4)
CO2: 25 meq/L (ref 22–29)
Chloride: 107 mEq/L (ref 98–109)
Creatinine: 0.7 mg/dL (ref 0.6–1.1)
GLUCOSE: 82 mg/dL (ref 70–140)
POTASSIUM: 4 meq/L (ref 3.5–5.1)
SODIUM: 140 meq/L (ref 136–145)

## 2016-05-21 LAB — UA PROTEIN, DIPSTICK - CHCC: Protein, ur: NEGATIVE mg/dL

## 2016-05-21 MED ORDER — HEPARIN SOD (PORK) LOCK FLUSH 100 UNIT/ML IV SOLN
500.0000 [IU] | Freq: Once | INTRAVENOUS | Status: AC | PRN
  Administered 2016-05-21: 500 [IU] via INTRAVENOUS
  Filled 2016-05-21: qty 5

## 2016-05-21 MED ORDER — SODIUM CHLORIDE 0.9 % IJ SOLN
10.0000 mL | INTRAMUSCULAR | Status: DC | PRN
  Administered 2016-05-21: 10 mL via INTRAVENOUS
  Filled 2016-05-21: qty 10

## 2016-05-21 NOTE — Patient Instructions (Signed)

## 2016-05-21 NOTE — Telephone Encounter (Signed)
appt made and avs printed °

## 2016-05-21 NOTE — Progress Notes (Signed)
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 She had cycle 5 carbo taxotere with cycle 3 avastin on 05-10-16. Last imaging was CT AP 03-16-16.  CA 125 was a bit higher on 05-10-16 at 89, this having been 63 on 04-19-16 and 122 on 04-05-16. Repeat marker sent today, as would need to reevaluate if continues to increase now.  Patient had additional IVF on day 2 with most recent treatment, which was helpful. Nausea has been better controlled with present antiemetics, tho po intake is still difficult for up to a week after treatment. Bowels are moving adequately. She notices increased fatigue with exertion "my muscles are not as strong" which may be steroid related proximal muscle weakness. Mid back discomfort continues, no other skeletal pain. She gets sufficient sleep at night. Only bleeding has been slight from nose with rhinorrhea/ environmental allergies. No LE swelling. No increased SOB or cough. Some wheezing intermittently last few days, none now. Bladder ok. No abdominal or pelvic pain. No problems with PAC. Remainder of 10 point Review of Systems negative.    Power PAC placed at [facility] 09-09-15 Flu vaccine 10-27-15 Genetics testing 10-27-15 normal ([facility] panel). 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  Travel plans were interrupted by weather so that she did not take trip to [facility] as planned. 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   ONCOLOGIC HISTORY Patient initially developed constipation and abdominal pain summer 2016 which she thought was stress related, then worsening abdominal and pelvic pain, some back pain and some  fever; she had weight gain ~ 30 lbs in prior 5-6 months. She was seen at [facility] with concern for acute appendicitis. She was evaluated at local ED, I believe [facility], with CT reportedly showing large left and small right pleural effusions, moderate ascites, complex septated cystic mass in low pelvis into left abdomen 13 x 7 x 7 cm, adenopathy Including large diaphragmatic lymph nodes anterior to heart and numerous mesenteric nodes, and apparent carcinomatosis with implants in omentum up to 3.8 cm. She was seen by Dr [name] and referred to Dr Alycia Rossetti. At Dr Elenora Gamma exam 08-31-15 there was large mass in cul de sac and abdominal fluid wave, decreased BS left lower 1/2 of chest. Lab studies included CA 125 33,145; inhibin B <10; CEA <0.5, AFP 2.2, LDH 1281 and HCG negative. Surgery by Dr Alycia Rossetti at [facility] on 09-06-15 was exploratory laparotomy with supracervical hysterectomy, BSO, omentectomy, resection with primary reanastomosis of rectosigmoid and peritoneal stripping. Intraoperative findings included 3 liters of bloody ascites, 12 cm omental mass adherent to uterus, sigmoid colon and rectum which was resected en bloc, bladder nodule resected. At completion of surgery there was residual tumor along entirety of small bowel mesentery with multiple nodules up to 1.5 cm, miliary disease on diaphragm, and peritoneal nodularity along pelvis and rectum (R2 resection). Pathology [facility] [phone] high grade serous carcinoma of bilateral ovaries involving bilateral tubes, uterine wall anterior and posterior and lower uterine segment, omentum, colon. No nodes submitted.  She was transfused 2 units PRBCs on each 9-9 and 09-13-15 for hemoglobin as low  as 6.8 - 7.1. She had urgent right thoracentesis for 750 cc on 09-08-15 with cytology positive for high grade serous carcinoma ([id number]) and left thoracentesis for 400 cc on 09-09-15. CT angio chest on POD 1 was negative for PE. She tolerated morphine by  PCA post operatively. She had mild cellulitis at abdominal incision on day of DC, begun on Keflex then. Psychiatry saw in hospital, begun on zoloft. She was discharged home with 28 days of lovenox. She saw Dr Alycia Rossetti on 09-19-15 with staples removed, however wound opened that evening. She was seen by gyn onc provider on 09-23-15, with wound open 3 x 5 cm, 3 cm deep, no evidence of infection. Wet to dry dressings bid were begun, ongoing. She had follow up wound check on 09-27-15, no findings of concern. GOG protocol at [facility] had delay, so that treatment given off protocol with first cycle of carboplatin taxol at [facility] on 09-26-15. She had additional 5 cycles of carboplatin taxol at [facility] thru 12-19-15. Restaging CT CAP showed no diffuse peritoneal disease remaining, small ascites and small right pleural effusion, thickened retrocecal appendix, right external iliac node 0.7 cm. Chemo continued with 2 cycles of carboplatin taxotere thru 02-27-16.  Objective:  Vital signs in last 24 hours:  BP 113/68 mmHg  Pulse 99  Temp(Src) 98.6 F (37 C) (Oral)  Resp 20  Ht _0  (1.651 m)  Wt 255 lb 11.2 oz (115.985 kg)  BMI 42.55 kg/m2  SpO2 100% Weight up 2 lbs. Respirations not labored RA, sounds slightly nasally congested.  Alert, oriented and appropriate, very pleasant as always. Ambulatory without difficulty, changes position easily on exam table.  Alopecia  HEENT:PERRL, sclerae not icteric, no excessive lacrimation. Oral mucosa moist without lesions, posterior pharynx minimal dull erytherma without exudate..  Neck supple. No JVD.  Lymphatics:no cervical,supraclavicular or inguinal adenopathy Resp: clear to auscultation bilaterally and normal percussion bilaterally, no wheezing Cardio: regular rate and rhythm. No gallop. GI: abdomen obese, soft, nontender, not distended, cannot appreciate mass or organomegaly. Normally active bowel sounds. Surgical incision not remarkable. Musculoskeletal/ Extremities: LE  without pitting edema, cords, tenderness. Spine not tender to palpation Neuro: no increase in peripheral neuropathy. Otherwise nonfocal Skin without rash including mid back, ecchymosis, petechiae. No nail changes Portacath-without erythema or tenderness  Lab Results:  Results for orders placed or performed in visit on 05/21/16  Urine protein by dipstick  Result Value Ref Range   Protein, ur Negative Negative- <30 mg/dL  CBC with Differential  Result Value Ref Range   WBC 15.8 (H) 3.9 - 10.3 10e3/uL   NEUT# 12.3 (H) 1.5 - 6.5 10e3/uL   HGB 12.1 11.6 - 15.9 g/dL   HCT 36.1 34.8 - 46.6 %   Platelets 170 145 - 400 10e3/uL   MCV 97.3 79.5 - 101.0 fL   MCH 32.6 25.1 - 34.0 pg   MCHC 33.5 31.5 - 36.0 g/dL   RBC 3.71 3.70 - 5.45 10e6/uL   RDW 15.0 (H) 11.2 - 14.5 %   lymph# 2.3 0.9 - 3.3 10e3/uL   MONO# 1.2 (H) 0.1 - 0.9 10e3/uL   Eosinophils Absolute 0.0 0.0 - 0.5 10e3/uL   Basophils Absolute 0.0 0.0 - 0.1 10e3/uL   NEUT% 77.8 (H) 38.4 - 76.8 %   LYMPH% 14.7 14.0 - 49.7 %   MONO% 7.3 0.0 - 14.0 %   EOS% 0.1 0.0 - 7.0 %   BASO% 0.1 0.0 - 2.0 %   nRBC 0 0 - 0 %  Basic metabolic panel  Result Value Ref Range   Sodium 140 136 - 145 mEq/L   Potassium 4.0 3.5 - 5.1 mEq/L   Chloride 107 98 - 109 mEq/L   CO2 25 22 - 29 mEq/L   Glucose 82 70 - 140 mg/dl   BUN 8.8 7.0 - 26.0 mg/dL   Creatinine 0.7 0.6 - 1.1 mg/dL   Calcium 9.7 8.4 - 10.4 mg/dL   Anion Gap 9 3 - 11 mEq/L   EGFR >90 >90 ml/min/1.73 m2  CA 125  Result Value Ref Range   Cancer Antigen (CA) 125 197.4 (H) 0.0 - 38.1 U/mL    CA 125 available after visit, further elevated as above  Studies/Results:  No results found.  Medications: I have reviewed the patient's current medications.  DISCUSSION Discussed deconditioning multifactorial, but likely including steroid induced proximal muscle weakness. Encouraged her to be as active as possible. She may want to try Y or possibly outpatient PT this summer.  CA 125 resulted  after visit higher as above. Will let patient know by phone and will try again to get PET, as CT AP 03-16-16 did not document progression (or can get CT again prior to PET if insurance requires, tho best information likely to be PET).  Will hold further carbo taxotere avastin until restaging done.  Will be in touch with Dr Alycia Rossetti.    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 Malignant pleural fluid and ascites at presentation. Persistent disease after 6 cycles carbo taxol, changed to [place] taxotere due to neuropathy, avastin added beginning 03-26-16. CT AP 03-16-16 stable. CA 125 now increasing, concerning for progression on present regimen. Will hold treatment until restaging. Will notify patient and update Dr Alycia Rossetti. Genetics testing negative for germline BRCA mutation.  Foundation One sent from [facility] on surgical path as above, not eligible for related trials now but may be option in future. 2.IVF with IV antiemetics helpful day 2 most recent chemo 3. Constipation: better with addition of miralax to present stool softeners, patient also increasing fiber in diet 5.power PAC, functioning well  6.chemo neutropenia, including cycle 3 carbo taxotere when on pro neulasta did not function. Resolved.  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 8.chemo neuropathy: taxane changed to taxotere. Gabapentin helpful and neuropathy some better off of taxol 9.mild chemo anemia, iron studies ok 09-30-15. 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 Will be in touch with patient as above. Time spent 30 min including >50% counseling and  coordination of care. Route PCP, cc Dr Cammie Mcgee, MD   05/22/2016, 2:15 PM

## 2016-05-22 ENCOUNTER — Other Ambulatory Visit: Payer: Self-pay | Admitting: Oncology

## 2016-05-22 ENCOUNTER — Telehealth: Payer: Self-pay | Admitting: Oncology

## 2016-05-22 DIAGNOSIS — C569 Malignant neoplasm of unspecified ovary: Secondary | ICD-10-CM

## 2016-05-22 DIAGNOSIS — T380X5A Adverse effect of glucocorticoids and synthetic analogues, initial encounter: Secondary | ICD-10-CM

## 2016-05-22 DIAGNOSIS — G720 Drug-induced myopathy: Secondary | ICD-10-CM | POA: Insufficient documentation

## 2016-05-22 LAB — CA 125: Cancer Antigen (CA) 125: 197.4 U/mL — ABNORMAL HIGH (ref 0.0–38.1)

## 2016-05-22 NOTE — Telephone Encounter (Signed)
MEDICAL ONCOLOGY  Tried to reach patient on cell now, LM asking her to call back to office on 05-23-16 and request that RN let me know so that I can speak with her.  Godfrey Pick, MD

## 2016-05-23 ENCOUNTER — Telehealth: Payer: Self-pay | Admitting: Oncology

## 2016-05-23 ENCOUNTER — Other Ambulatory Visit: Payer: Self-pay | Admitting: Oncology

## 2016-05-23 DIAGNOSIS — C569 Malignant neoplasm of unspecified ovary: Secondary | ICD-10-CM

## 2016-05-23 NOTE — Telephone Encounter (Signed)
MEDICAL ONCOLOGY  Returned call to patient now, letting her know that CA 125 marker up to 197, this having been 89 on 05-10-16. Information to be sent to Dr Alycia Rossetti I have requested PET. Patient understands that this needs to be preauthorized and may need MD peer to peer. She has taken off work 6-1 thru 6-6 for the chemo that had been planned, so those days would be easy for her to get scans.  Chemo 6-1 + lab and IVF 6-2 cancelled.  She is glad to see Dr Alycia Rossetti at [facility] if needed.  Patient understands and appreciated call.  Godfrey Pick, MD

## 2016-05-23 NOTE — Telephone Encounter (Signed)
MEDICAL ONCOLOGY  Per [facility] managed care 05-23-16: BCBS approved the CT Chest and CT Abd/Pelvis.   [employer] denied the PET. If you want to discuss this case with a physician reviewer you can call contact [employer] at [phone].  Member AL:[id number]  Date of Birth:August 21, 1986    MD spoke directly with insurance, who did not have the PET request, but entered that, corrected [place] address, then spoke with peer MD to give history. Total MD phone time 20 min.   PET approved, can be done at [facility] thru June 22, auth # [id number]  Per that reviewing MD, OK to do PET alone, insurance does not require additional CTs prior to this PET, tho the CTs are authorized and can still be done if we choose.  Message to [facility] to let radiology scheduling know to set up PET, prefer June 1 or June 2 if possible. Will not set up CTs now, but will keep those authorized in case some problem with PET that CTs would be needed.   Godfrey Pick, MD

## 2016-05-23 NOTE — Telephone Encounter (Signed)
Left message to inform patient of PET scan appt 6/1 at 1030 am at [facility]

## 2016-05-23 NOTE — Telephone Encounter (Signed)
Medical Oncology  Spoke with [facility] managed care, as I realized the PET authorization was done for restaging skull base to thigh, but should be initial skull base to thigh (as the planned initial PET was the one that did not get authorized in 02-2016).  [facility] managed care to call back to insurance to clarify that requested scan is actually initial PET. Will let MD know if another peer to peer needed with this change.  MD unable to discontinue order for restaging PET in EMR now, but I have added another order for an initial PET.  Godfrey Pick, MD

## 2016-05-31 ENCOUNTER — Ambulatory Visit: Payer: BC Managed Care – PPO

## 2016-05-31 ENCOUNTER — Other Ambulatory Visit: Payer: BC Managed Care – PPO

## 2016-05-31 ENCOUNTER — Ambulatory Visit (HOSPITAL_COMMUNITY)
Admission: RE | Admit: 2016-05-31 | Discharge: 2016-05-31 | Disposition: A | Discharge status: Home / Self Care | Payer: BC Managed Care – PPO | Source: Ambulatory Visit | Attending: Oncology | Admitting: Oncology

## 2016-05-31 ENCOUNTER — Other Ambulatory Visit: Payer: Self-pay | Admitting: Oncology

## 2016-05-31 DIAGNOSIS — R932 Abnormal findings on diagnostic imaging of liver and biliary tract: Secondary | ICD-10-CM | POA: Insufficient documentation

## 2016-05-31 DIAGNOSIS — C786 Secondary malignant neoplasm of retroperitoneum and peritoneum: Secondary | ICD-10-CM | POA: Insufficient documentation

## 2016-05-31 DIAGNOSIS — C569 Malignant neoplasm of unspecified ovary: Secondary | ICD-10-CM | POA: Diagnosis present

## 2016-05-31 LAB — GLUCOSE, CAPILLARY: GLUCOSE-CAPILLARY: 92 mg/dL (ref 65–99)

## 2016-05-31 MED ORDER — FLUDEOXYGLUCOSE F - 18 (FDG) INJECTION
12.5000 | Freq: Once | INTRAVENOUS | Status: AC | PRN
  Administered 2016-05-31: 12.5 via INTRAVENOUS

## 2016-06-01 ENCOUNTER — Ambulatory Visit: Payer: BC Managed Care – PPO

## 2016-06-08 ENCOUNTER — Telehealth: Payer: Self-pay | Admitting: Oncology

## 2016-06-08 ENCOUNTER — Other Ambulatory Visit: Payer: Self-pay | Admitting: Oncology

## 2016-06-08 NOTE — Telephone Encounter (Signed)
Opened in error

## 2016-06-08 NOTE — Telephone Encounter (Signed)
Per 6/9 pof appointments for lab/LL on 6/12 cxd - per pof pt aware. Also per pof patient to see Dr. Alycia Rossetti 6/16 per Melissa C. No appointment for Dr. Alycia Rossetti currently on schedule for 6/13 - gyn RN informed.

## 2016-06-11 ENCOUNTER — Other Ambulatory Visit: Payer: BC Managed Care – PPO

## 2016-06-11 ENCOUNTER — Ambulatory Visit: Payer: BC Managed Care – PPO | Admitting: Oncology

## 2016-06-12 ENCOUNTER — Encounter: Payer: Self-pay | Admitting: Gynecologic Oncology

## 2016-06-12 ENCOUNTER — Encounter: Payer: Self-pay | Admitting: Medical Oncology

## 2016-06-12 ENCOUNTER — Ambulatory Visit (HOSPITAL_BASED_OUTPATIENT_CLINIC_OR_DEPARTMENT_OTHER): Payer: BC Managed Care – PPO | Admitting: Gynecologic Oncology

## 2016-06-12 VITALS — BP 122/89 | HR 90 | Temp 98.3°F | Resp 18 | Ht 65.0 in | Wt 261.2 lb

## 2016-06-12 DIAGNOSIS — Z006 Encounter for examination for normal comparison and control in clinical research program: Secondary | ICD-10-CM | POA: Diagnosis not present

## 2016-06-12 NOTE — Patient Instructions (Signed)
Plan to have a CT scan on Thurs at [place] at 8:30am with arrival at 8:15am.  Nothing to eat or drink 4 hours before.  First bottle of contrast at 6:30am and second at 7:30am.

## 2016-06-12 NOTE — Progress Notes (Signed)
Follow Up Note: Gyn-Onc  Sheena Miles 30 y.o. female  CC:  Chief Complaint  Patient presents with  . Follow-up    HPI:  Sheena Miles is a 30 year old female, gravida 0, initially referred by Dr. [name] for a newly diagnosed ovarian mass.  She had irregular cycles for many years and went through menarche at the age of 11.  She was experiencing a lot of stress as she had a difficult relationship with her husband and she left him in June of this year. She began experiencing some pain in her shoulder, abdomen, leg and began having some irregular bowel movements which she felt related to stress.  She sought care at an Urgent Care for her moderate anxiety.  At that time, an exam was performed that revealed some tenderness in the right lower quadrant. She also had a low-grade temperature and was referred to the emergency room to rule out appendicitis or cholecystitis. She reports her abdomen was getting "hard" in early August with intermittent back pain. She also began experiencing pelvic pain over the past 2 weeks with intermittent nausea/vomiting that started in June. She's gained approximately 30 pounds in the last 5-6 months and is been having increasing fatigue.  In the emergency room, her CT scan revealed: She had a small right and large left sided pleural effusion with mild underlying atelectasis bilaterally. The gallbladder appeared normal. There was some ascites with a fluid collection in the area of the falciform ligament measuring 4 x 3 cm. There was another lentiform collection with a similar attenuation. Overall she had moderate volume ascites throughout the abdomen. The pancreas, spleen, adrenals, kidneys, stomach, bowel were unremarkable. There are large diaphragmatic lymph nodes anterior to the heart measuring up to 1.3 cm. There are numerous small mesenteric lymph nodes present. The uterus and ovaries were not discretely identified. There is a multi-cystic septated complex mass  arising out of the low pelvis and extending into the left abdomen. It measured 13 x 7 x 7 cm. A prominently had cystic component along the cranial edge along the caudal edge of the abnormality. There is infiltration and probable carcinomatosis of the omentum. There were numerous omental implants with the largest measuring 3.8 cm.   She was initially seen on 08/31/15.  Lab work resulted: CA 125 33145, LDH 663, Inhibin B <10, CEA <0.5, AFP 2.2. On 09/06/15, she underwent an exploratory laparotomy with supracervical hysterectomy, bilateral salpingoophorectomy, omentectomy, peritoneal stripping, resection of bladder nodule, rectosigmoid resection with mobilization of the splenic flexure and primary end-to-end anastamosis (R2 resection). Operative findings included: 1. 3L of bloody ascites on abdominal entry  2. 12cm omental mass adherent to the colon, pelvic mass, anterior abdominal wall - resected and frozen with serous cancer  3. Bilateral adnexal masses densely adherent to the uterus, sigmoid colon, and rectum - resected en bloc  4. Pelvic peritoneal nodularity  5. 3cm Bladder nodule - resected  6. Residual tumor along the entirely of the small bowel mesentery with multiple nodules up to 1.5cm in size, miliary disease of the diaphragm, and peritoneal nodularity along the pelvis and rectum (R2)  7. Normal appearing gallbladder, liver, spleen, and stomach  8. Negative bubble test following primary anastamosis of the recto-sigmoid with two donuts removed following anastamosis  Final pathology revealed: Final Diagnosis  FSA, A: Omentum, partial omentectomy  - Extensive metastatic serous carcinoma  B: Uterus, cervix, bilateral tubes and ovaries and colon, hysterectomy, bilateral salpingo-oophorectomy and partial colectomy  - High grade serous  carcinoma, see synoptic report below  C: Colon, anastomotic rings, excision  - Colonic mucosa and wall negative for malignancy   Port a cath was placed at [facility] on  09/09/15.       Interval History:  She is overall doing very well. She had a CT scan post cycle number 6. It revealed: IMPRESSION: 1. Although the recent oncology notes indicate that at the time of completion of surgery the patient had residual tumor along the small bowel mesentery and diaphragmatic and peritoneal nodularity, this tumor deposition seems much less notable on today's exam. There is some trace edema along the paracolic gutters and a small nodule in the adipose tissue above the transverse colon, but the diffuse peritoneal disease described at the time of completion of surgery is no longer present indicating a good response to therapy. 2. Small right pleural effusion, nonspecific. 3. Abnormally thickened appendix at 1.2 cm. This could be from low grade inflammation, tumor deposition along the appendix, or a small appendiceal mass. I doubt that this is a mucocele given the lack of low-density. 4. Trace ascites along Morrison's pouch. Trace ascites below the spleen. 5. Prominent stool throughout the colon favors constipation.  She had additional 2 cycles of carboplatin taxotere thru 02-27-16. CT revealed: IMPRESSION: 1. Similar appearance to the prior exam, without well-defined tumor nodularity. Trace ascites along Randol Kern' s pouch in below the liver do just like before. Tiny nodule in the adipose tissue above the transverse colon appears stable. 2. Trace right pleural effusion. 3. Borderline prominence of stool in the distal colon. 4. The AP diameter of the vagina is mildly thickened at 2.1 cm for the double wall thickness. This is stable and now that we are further out postoperatively I doubt that this is all postoperative swelling. I suspect that this is benign/incidental as there is no focal thickening at the vaginal cuff, but this may merit observation.  The patient had undergone a supracervical hysterectomy so the thickening at the vaginal cuff is not worrisome.  She had cycle 5  carbo taxotere with cycle 3 avastin on 05-10-16. Last imaging was CT AP 03-16-16. CA 125 was a bit higher on 05-10-16 at 89, this having been 63 on 04-19-16 and 122 on 04-05-16. CA-125 up to 197 on 5/22.  PET scan revealed:  PET 05/31/16: CHEST  No hypermetabolic mediastinal or hilar nodes. No suspicious pulmonary nodules on the CT scan. Right Port-A-Cath tip: Cavoatrial junction. Incidental azygos fissure.  ABDOMEN/PELVIS  Abnormal peritoneal metastatic disease. Faint nodularity is best seen along the anterior inferior liver edge for example on [id number] series 4, with this nodularity having a maximum standard uptake value of 10.1. Focal activity along the posterior hepaticmargin has maximum standard uptake value of 8.1. In contrast, the liver parenchymal activity has maximum standard uptake value of 4.6.  There is abnormal hypermetabolic activity and mild thickening in both paracolic gutters. On the right, a focus of this hypermetabolicactivity has a maximum SUV of 5.5. In the left lower quadrant justbelow the level of the iliac crests, a focus of hypermetabolic activity in the left paracolic gutter adjacent to the junction of the descending and sigmoid colon has a maximum standard uptake value 8.0.  Along the right adnexa, there is a focus of high activity with maximum SUV 10.7. Also, along the vaginal cuff there is a focus of high activity maximum SUV 12.4.  No large intra-abdominal mass is seen.  SKELETON  Faint diffuse hypermetabolic activity in the bony structures, query marrow  stimulation.  IMPRESSION: 1. Scattered peritoneal foci of metastatic disease are hypermetabolic. These are most notable along the liver margin, paracolic gutters, and vaginal cuff.  She comes in to discuss treatment options. We discussed two available NRG trials. One is at [facility] and it evaluating 4 arms (cediranib, olaparib, combination or standard chemo) versus Nivolomab +/- ipilumumab which is available  at [facility]. After our discussion, she is more interested in the [facility] trial as the [facility] trial has 2 arms that she is not very excited about namely standard chemo and olaparib as she is BRCA negative. Mira from the protocol office came. It appears that she may be eligible pending measurable disease on CT scan, ECHO and labs. Consent signed today.  Current Meds:  Outpatient Encounter Prescriptions as of 06/12/2016  Medication Sig  . dexamethasone (DECADRON) 4 MG tablet TAKE 2 TABLETS BY MOUTH TWICE DAILY FOR 3 DAYS, STARTING THE DAY PRIOR TO TAXOTERE.  . ferrous fumarate (HEMOCYTE - 106 MG FE) 325 (106 FE) MG TABS tablet Take 1 tablet daily on an empty stomach with OJ.  . gabapentin (NEURONTIN) 100 MG capsule Take 1 capsule (100 mg total) by mouth at bedtime.  . hydrocortisone (ANUSOL-HC) 2.5 % rectal cream Place 1 application rectally 2 (two) times daily as needed for hemorrhoids or itching.  Marland Kitchen HYDROmorphone (DILAUDID) 2 MG tablet 1 tablet every 4-6 hours as needed for pain  . ibuprofen (ADVIL,MOTRIN) 200 MG tablet Take 600 mg by mouth every 8 (eight) hours as needed. Reported on 04/05/2016  . lidocaine-prilocaine (EMLA) cream Apply 1 application topically as needed.  Marland Kitchen LORazepam (ATIVAN) 1 MG tablet Take 1 tablet (1 mg total) by mouth every 8 (eight) hours. for nausea.  . Multiple Vitamins-Minerals (MULTI-VITAMIN GUMMIES) CHEW Chew by mouth daily.  Marland Kitchen nystatin (MYCOSTATIN/NYSTOP) 100000 UNIT/GM POWD Apply to skin after drying twice a day as directed  . omeprazole (PRILOSEC) 10 MG capsule Take 10 mg by mouth daily as needed.  . ondansetron (ZOFRAN) 8 MG tablet Take 1 tablet (8 mg total) by mouth every 8 (eight) hours as needed for nausea or vomiting.  Marland Kitchen PROAIR HFA 108 (90 Base) MCG/ACT inhaler Inhale 1 puff into the lungs 2 (two) times daily as needed.  . prochlorperazine (COMPAZINE) 10 MG tablet Take 1 tablet (10 mg total) by mouth every 6 (six) hours as needed.  . sertraline (ZOLOFT) 50 MG tablet Take 50  mg by mouth daily after supper.   No facility-administered encounter medications on file as of 06/12/2016.    Allergy:  Allergies  Allergen Reactions  . Codeine Palpitations    Described by patient as "horrible panic attack feeling and shortness of breath"    Social Hx:   Social History   Social History  . Marital Status: Legally Separated    Spouse Name: N/A  . Number of Children: N/A  . Years of Education: N/A   Occupational History  . Not on file.   Social History Main Topics  . Smoking status: Never Smoker   . Smokeless tobacco: Never Used     Comment: some hookah in college  . Alcohol Use: Yes     Comment: 1-2 nights week socially  . Drug Use: No  . Sexual Activity: No   Other Topics Concern  . Not on file   Social History Narrative    Past Surgical Hx:  Past Surgical History  Procedure Laterality Date  . Wisdom tooth extraction  2013    Past Medical Hx:  Past  Medical History  Diagnosis Date  . Allergy   . Asthma     exercise induced    Family Hx:  Family History  Problem Relation Age of Onset  . Diabetes Father   . Congestive Heart Failure Father   . Atrial fibrillation Father   . Crohn's disease Maternal Grandmother   . Breast cancer Paternal Grandmother     dx. 84s  . Skin cancer Paternal Uncle     unspecified type; dx. 33 or younger  . Heart attack Paternal Grandfather     Vitals:  Blood pressure 122/89, pulse 90, temperature 98.3 F (36.8 C), temperature source Oral, resp. rate 18, height 5' 5"  (1.651 m), weight 261 lb 3.2 oz (118.48 kg), SpO2 100 %.  Physical Exam:  General: Well developed, well nourished female in no acute distress. Alert and oriented x 3. 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    First cycle of chemotherapy on 09/26/15.  Her CA-125 was coming down very nicely, but is starting to rise. Bevacizumab was added with her last cycle but she experience progression and is platinum and taxane resistant.  Will move forward with enrollment in [place] 03 trial. CT approved and we are scheduling. ECHO pending. Will sign consent today and we will get labs ordered.   Nancy Marus A., MD 06/12/2016, 4:21 PM

## 2016-06-13 ENCOUNTER — Other Ambulatory Visit (HOSPITAL_COMMUNITY): Payer: BC Managed Care – PPO

## 2016-06-14 ENCOUNTER — Telehealth: Payer: Self-pay | Admitting: Gynecologic Oncology

## 2016-06-14 ENCOUNTER — Ambulatory Visit (HOSPITAL_COMMUNITY)
Admission: RE | Admit: 2016-06-14 | Discharge: 2016-06-14 | Disposition: A | Discharge status: Home / Self Care | Payer: BC Managed Care – PPO | Source: Ambulatory Visit | Attending: Oncology | Admitting: Oncology

## 2016-06-14 ENCOUNTER — Telehealth: Payer: Self-pay | Admitting: *Deleted

## 2016-06-14 DIAGNOSIS — Z9221 Personal history of antineoplastic chemotherapy: Secondary | ICD-10-CM | POA: Diagnosis not present

## 2016-06-14 DIAGNOSIS — R935 Abnormal findings on diagnostic imaging of other abdominal regions, including retroperitoneum: Secondary | ICD-10-CM | POA: Diagnosis not present

## 2016-06-14 DIAGNOSIS — C569 Malignant neoplasm of unspecified ovary: Secondary | ICD-10-CM | POA: Diagnosis present

## 2016-06-14 MED ORDER — IOPAMIDOL (ISOVUE-300) INJECTION 61%
100.0000 mL | Freq: Once | INTRAVENOUS | Status: AC | PRN
  Administered 2016-06-14: 100 mL via INTRAVENOUS

## 2016-06-14 NOTE — Telephone Encounter (Signed)
Call from [place] that CT results are in [place].

## 2016-06-14 NOTE — Telephone Encounter (Signed)
Informed patient of CT scan results and that she would not be eligible for the study at the [facility] here or at [facility] since there was no measurable disease on her CT.  Verbalizing understanding.  Advised patient to keep echo appt tomorrow per Dr. Marko Plume.  No concerns voiced.  Advised to call for any needs or concerns.

## 2016-06-15 ENCOUNTER — Ambulatory Visit (HOSPITAL_COMMUNITY)
Admission: RE | Admit: 2016-06-15 | Discharge: 2016-06-15 | Disposition: A | Discharge status: Home / Self Care | Payer: BC Managed Care – PPO | Source: Ambulatory Visit | Attending: Gynecologic Oncology | Admitting: Gynecologic Oncology

## 2016-06-15 DIAGNOSIS — Z006 Encounter for examination for normal comparison and control in clinical research program: Secondary | ICD-10-CM

## 2016-06-15 LAB — ECHOCARDIOGRAM COMPLETE
E/e' ratio: 4.3
EWDT: 225 ms
FS: 29 % (ref 28–44)
IV/PV OW: 1.02
LA vol index: 15.3 mL/m2
LA vol: 33.9 mL
LADIAMINDEX: 1.67 cm/m2
LASIZE: 37 mm
LAVOLA4C: 25.5 mL
LEFT ATRIUM END SYS DIAM: 37 mm
LV E/e' medial: 4.3
LV E/e'average: 4.3
LV TDI E'LATERAL: 13.3
LV TDI E'MEDIAL: 8.59
LVELAT: 13.3 cm/s
LVOT area: 3.8 cm2
LVOTD: 22 mm
MV Dec: 225
MV pk A vel: 68.9 m/s
MV pk E vel: 57.2 m/s
PW: 10.1 mm — AB (ref 0.6–1.1)
RV TAPSE: 18.7 mm

## 2016-06-15 NOTE — Progress Notes (Signed)
  Echocardiogram 2D Echocardiogram has been performed.  Donata Clay 06/15/2016, 12:31 PM

## 2016-06-18 ENCOUNTER — Other Ambulatory Visit: Payer: Self-pay | Admitting: Oncology

## 2016-06-18 ENCOUNTER — Telehealth: Payer: Self-pay | Admitting: Oncology

## 2016-06-18 NOTE — Telephone Encounter (Signed)
appt made and avs printed °

## 2016-06-25 ENCOUNTER — Ambulatory Visit (HOSPITAL_COMMUNITY): Payer: BC Managed Care – PPO

## 2016-06-27 ENCOUNTER — Encounter: Payer: Self-pay | Admitting: Gynecologic Oncology

## 2016-06-27 ENCOUNTER — Ambulatory Visit: Payer: BC Managed Care – PPO | Attending: Gynecologic Oncology | Admitting: Gynecologic Oncology

## 2016-06-27 VITALS — BP 129/91 | HR 108 | Temp 98.3°F | Resp 19 | Ht 65.0 in | Wt 261.9 lb

## 2016-06-27 DIAGNOSIS — Z9221 Personal history of antineoplastic chemotherapy: Secondary | ICD-10-CM | POA: Diagnosis not present

## 2016-06-27 DIAGNOSIS — Z803 Family history of malignant neoplasm of breast: Secondary | ICD-10-CM | POA: Diagnosis not present

## 2016-06-27 DIAGNOSIS — Z90722 Acquired absence of ovaries, bilateral: Secondary | ICD-10-CM | POA: Diagnosis not present

## 2016-06-27 DIAGNOSIS — C569 Malignant neoplasm of unspecified ovary: Secondary | ICD-10-CM

## 2016-06-27 DIAGNOSIS — R102 Pelvic and perineal pain: Secondary | ICD-10-CM | POA: Diagnosis not present

## 2016-06-27 DIAGNOSIS — Z8249 Family history of ischemic heart disease and other diseases of the circulatory system: Secondary | ICD-10-CM | POA: Diagnosis not present

## 2016-06-27 DIAGNOSIS — Z90710 Acquired absence of both cervix and uterus: Secondary | ICD-10-CM | POA: Insufficient documentation

## 2016-06-27 DIAGNOSIS — Z833 Family history of diabetes mellitus: Secondary | ICD-10-CM | POA: Diagnosis not present

## 2016-06-27 DIAGNOSIS — K5900 Constipation, unspecified: Secondary | ICD-10-CM | POA: Diagnosis not present

## 2016-06-27 DIAGNOSIS — Z808 Family history of malignant neoplasm of other organs or systems: Secondary | ICD-10-CM | POA: Diagnosis not present

## 2016-06-27 DIAGNOSIS — J90 Pleural effusion, not elsewhere classified: Secondary | ICD-10-CM | POA: Diagnosis not present

## 2016-06-27 DIAGNOSIS — Z9889 Other specified postprocedural states: Secondary | ICD-10-CM | POA: Diagnosis not present

## 2016-06-27 DIAGNOSIS — J45909 Unspecified asthma, uncomplicated: Secondary | ICD-10-CM | POA: Diagnosis not present

## 2016-06-27 DIAGNOSIS — R188 Other ascites: Secondary | ICD-10-CM | POA: Insufficient documentation

## 2016-06-27 NOTE — Patient Instructions (Signed)
Follow up with [facility] as scheduled.

## 2016-06-27 NOTE — Progress Notes (Signed)
Follow Up Note: Gyn-Onc  Sheena Miles 30 y.o. female  CC:  Chief Complaint  Patient presents with  . Follow-up    HPI:  Sheena Miles is a 30 year old female, gravida 0, initially referred by Dr. Lynnda Miles for a newly diagnosed ovarian mass.  She had irregular cycles for many years and went through menarche at the age of 11.  She was experiencing a lot of stress as she had a difficult relationship with her husband and she left him in June of this year. She began experiencing some pain in her shoulder, abdomen, leg and began having some irregular bowel movements which she felt related to stress.  She sought care at an Urgent Care for her moderate anxiety.  At that time, an exam was performed that revealed some tenderness in the right lower quadrant. She also had a low-grade temperature and was referred to the emergency room to rule out appendicitis or cholecystitis. She reports her abdomen was getting "hard" in early August with intermittent back pain. She also began experiencing pelvic pain over the past 2 weeks with intermittent nausea/vomiting that started in June. She's gained approximately 30 pounds in the last 5-6 months and is been having increasing fatigue.  In the emergency room, her CT scan revealed: She had a small right and large left sided pleural effusion with mild underlying atelectasis bilaterally. The gallbladder appeared normal. There was some ascites with a fluid collection in the area of the falciform ligament measuring 4 x 3 cm. There was another lentiform collection with a similar attenuation. Overall she had moderate volume ascites throughout the abdomen. The pancreas, spleen, adrenals, kidneys, stomach, bowel were unremarkable. There are large diaphragmatic lymph nodes anterior to the heart measuring up to 1.3 cm. There are numerous small mesenteric lymph nodes present. The uterus and ovaries were not discretely identified. There is a multi-cystic septated complex mass  arising out of the low pelvis and extending into the left abdomen. It measured 13 x 7 x 7 cm. A prominently had cystic component along the cranial edge along the caudal edge of the abnormality. There is infiltration and probable carcinomatosis of the omentum. There were numerous omental implants with the largest measuring 3.8 cm.   She was initially seen on 08/31/15.  Lab work resulted: CA 125 33145, LDH 663, Inhibin B <10, CEA <0.5, AFP 2.2. On 09/06/15, she underwent an exploratory laparotomy with supracervical hysterectomy, bilateral salpingoophorectomy, omentectomy, peritoneal stripping, resection of bladder nodule, rectosigmoid resection with mobilization of the splenic flexure and primary end-to-end anastamosis (R2 resection). Operative findings included: 1. 3L of bloody ascites on abdominal entry  2. 12cm omental mass adherent to the colon, pelvic mass, anterior abdominal wall - resected and frozen with serous cancer  3. Bilateral adnexal masses densely adherent to the uterus, sigmoid colon, and rectum - resected en bloc  4. Pelvic peritoneal nodularity  5. 3cm Bladder nodule - resected  6. Residual tumor along the entirely of the small bowel mesentery with multiple nodules up to 1.5cm in size, miliary disease of the diaphragm, and peritoneal nodularity along the pelvis and rectum (R2)  7. Normal appearing gallbladder, liver, spleen, and stomach  8. Negative bubble test following primary anastamosis of the recto-sigmoid with two donuts removed following anastamosis  Final pathology revealed: Final Diagnosis  FSA, A: Omentum, partial omentectomy  - Extensive metastatic serous carcinoma  B: Uterus, cervix, bilateral tubes and ovaries and colon, hysterectomy, bilateral salpingo-oophorectomy and partial colectomy  - High grade serous  carcinoma, see synoptic report below  C: Colon, anastomotic rings, excision  - Colonic mucosa and wall negative for malignancy   Port a cath was placed at [facility] on  09/09/15.       Interval History:  She is overall doing very well. She had a CT scan post cycle number 6. It revealed: IMPRESSION: 1. Although the recent oncology notes indicate that at the time of completion of surgery the patient had residual tumor along the small bowel mesentery and diaphragmatic and peritoneal nodularity, this tumor deposition seems much less notable on today's exam. There is some trace edema along the paracolic gutters and a small nodule in the adipose tissue above the transverse colon, but the diffuse peritoneal disease described at the time of completion of surgery is no longer present indicating a good response to therapy. 2. Small right pleural effusion, nonspecific. 3. Abnormally thickened appendix at 1.2 cm. This could be from low grade inflammation, tumor deposition along the appendix, or a small appendiceal mass. I doubt that this is a mucocele given the lack of low-density. 4. Trace ascites along Morrison's pouch. Trace ascites below the spleen. 5. Prominent stool throughout the colon favors constipation.  She had additional 2 cycles of carboplatin taxotere thru 02-27-16. CT revealed: IMPRESSION: 1. Similar appearance to the prior exam, without well-defined tumor nodularity. Trace ascites along Sheena Miles' s pouch in below the liver do just like before. Tiny nodule in the adipose tissue above the transverse colon appears stable. 2. Trace right pleural effusion. 3. Borderline prominence of stool in the distal colon. 4. The AP diameter of the vagina is mildly thickened at 2.1 cm for the double wall thickness. This is stable and now that we are further out postoperatively I doubt that this is all postoperative swelling. I suspect that this is benign/incidental as there is no focal thickening at the vaginal cuff, but this may merit observation.  The patient had undergone a supracervical hysterectomy so the thickening at the vaginal cuff is not worrisome.  She had cycle 5  carbo taxotere with cycle 3 avastin on 05-10-16. Last imaging was CT AP 03-16-16. CA 125 was a bit higher on 05-10-16 at 89, this having been 63 on 04-19-16 and 122 on 04-05-16. CA-125 up to 197 on 5/22.  PET scan revealed:  PET 05/31/16: CHEST  No hypermetabolic mediastinal or hilar nodes. No suspicious pulmonary nodules on the CT scan. Right Port-A-Cath tip: Cavoatrial junction. Incidental azygos fissure.  ABDOMEN/PELVIS  Abnormal peritoneal metastatic disease. Faint nodularity is best seen along the anterior inferior liver edge for example on [id number] series 4, with this nodularity having a maximum standard uptake value of 10.1. Focal activity along the posterior hepaticmargin has maximum standard uptake value of 8.1. In contrast, the liver parenchymal activity has maximum standard uptake value of 4.6.  There is abnormal hypermetabolic activity and mild thickening in both paracolic gutters. On the right, a focus of this hypermetabolicactivity has a maximum SUV of 5.5. In the left lower quadrant justbelow the level of the iliac crests, a focus of hypermetabolic activity in the left paracolic gutter adjacent to the junction of the descending and sigmoid colon has a maximum standard uptake value 8.0.  Along the right adnexa, there is a focus of high activity with maximum SUV 10.7. Also, along the vaginal cuff there is a focus of high activity maximum SUV 12.4.  No large intra-abdominal mass is seen.  SKELETON  Faint diffuse hypermetabolic activity in the bony structures, query marrow  stimulation.  IMPRESSION: 1. Scattered peritoneal foci of metastatic disease are hypermetabolic. These are most notable along the liver margin, paracolic gutters, and vaginal cuff.  She is CT scan for protocol eligibility ultimately was not eligible because there was no lesion measurable by resist criteria. She had her mom come in to discuss options. Her last CA-125 was 197 on May 22. We reviewed a  series of options and ultimately decided on proceeding with niraparib.  Current Meds:  Outpatient Encounter Prescriptions as of 06/27/2016  Medication Sig  . dexamethasone (DECADRON) 4 MG tablet TAKE 2 TABLETS BY MOUTH TWICE DAILY FOR 3 DAYS, STARTING THE DAY PRIOR TO TAXOTERE.  . ferrous fumarate (HEMOCYTE - 106 MG FE) 325 (106 FE) MG TABS tablet Take 1 tablet daily on an empty stomach with OJ.  . gabapentin (NEURONTIN) 100 MG capsule Take 1 capsule (100 mg total) by mouth at bedtime.  . hydrocortisone (ANUSOL-HC) 2.5 % rectal cream Place 1 application rectally 2 (two) times daily as needed for hemorrhoids or itching.  Marland Kitchen HYDROmorphone (DILAUDID) 2 MG tablet 1 tablet every 4-6 hours as needed for pain  . ibuprofen (ADVIL,MOTRIN) 200 MG tablet Take 600 mg by mouth every 8 (eight) hours as needed. Reported on 04/05/2016  . lidocaine-prilocaine (EMLA) cream Apply 1 application topically as needed.  Marland Kitchen LORazepam (ATIVAN) 1 MG tablet Take 1 tablet (1 mg total) by mouth every 8 (eight) hours. for nausea.  . Multiple Vitamins-Minerals (MULTI-VITAMIN GUMMIES) CHEW Chew by mouth daily.  Marland Kitchen nystatin (MYCOSTATIN/NYSTOP) 100000 UNIT/GM POWD Apply to skin after drying twice a day as directed  . omeprazole (PRILOSEC) 10 MG capsule Take 10 mg by mouth daily as needed.  . ondansetron (ZOFRAN) 8 MG tablet Take 1 tablet (8 mg total) by mouth every 8 (eight) hours as needed for nausea or vomiting.  Marland Kitchen PROAIR HFA 108 (90 Base) MCG/ACT inhaler Inhale 1 puff into the lungs 2 (two) times daily as needed.  . prochlorperazine (COMPAZINE) 10 MG tablet Take 1 tablet (10 mg total) by mouth every 6 (six) hours as needed.  . sertraline (ZOLOFT) 50 MG tablet Take 50 mg by mouth daily after supper.   No facility-administered encounter medications on file as of 06/27/2016.    Allergy:  Allergies  Allergen Reactions  . Codeine Palpitations    Described by patient as "horrible panic attack feeling and shortness of breath"     Social Hx:   Social History   Social History  . Marital Status: Legally Separated    Spouse Name: N/A  . Number of Children: N/A  . Years of Education: N/A   Occupational History  . Not on file.   Social History Main Topics  . Smoking status: Never Smoker   . Smokeless tobacco: Never Used     Comment: some hookah in college  . Alcohol Use: Yes     Comment: 1-2 nights week socially  . Drug Use: No  . Sexual Activity: No   Other Topics Concern  . Not on file   Social History Narrative    Past Surgical Hx:  Past Surgical History  Procedure Laterality Date  . Wisdom tooth extraction  2013    Past Medical Hx:  Past Medical History  Diagnosis Date  . Allergy   . Asthma     exercise induced    Family Hx:  Family History  Problem Relation Age of Onset  . Diabetes Father   . Congestive Heart Failure Father   . Atrial  fibrillation Father   . Crohn's disease Maternal Grandmother   . Breast cancer Paternal Grandmother     dx. 44s  . Skin cancer Paternal Uncle     unspecified type; dx. 73 or younger  . Heart attack Paternal Grandfather     Vitals:  Blood pressure 129/91, pulse 108, temperature 98.3 F (36.8 C), temperature source Oral, resp. rate 19, height 5\' 5"  (1.651 m), weight 261 lb 14.4 oz (118.797 kg), SpO2 99 %.  Physical Exam:  General: Well developed, well nourished female in no acute distress. Alert and oriented x 3. 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    First cycle of chemotherapy on 09/26/15.  Her CA-125 was coming down very nicely, but is starting to rise. Bevacizumab was added with her last cycle but she experience  progression and is platinum and taxane resistant But did have a partial response to platinum. I believe that she is a good candidate for prep inhibitor. Based on FDA criteria she is a candidate for niraparib.  Greater than 25 minutes face-to-face time was spent with the patient and her mom discussing the neuropathy, the mechanisms of action, the most usual side effects and risks. We did discuss leukemic risks as well as myelodysplastic syndrome. We discussed pancytopenia as well as nausea. She understands that she'll need to come in for weekly CBCs for the first month and a half after 1 month she is tolerating it well regarding monthly CBCs which can be done in conjunction with her CA-125's. I will communicate this with Dr. Marko Plume to see if she would like for [place] to coordinate the treatment or if she would rather coordinate this from her office. The patient is optimistic that this will at least provide her a break from chemotherapy in the traditional sense and allow her to have some improvement in progression free survival before resuming therapy. We had a very frank conversation that this would not necessarily cure her. She is well aware of this as is her mother.   Lauri Purdum A., MD 06/27/2016, 5:06 PM

## 2016-07-01 ENCOUNTER — Other Ambulatory Visit: Payer: Self-pay | Admitting: Oncology

## 2016-07-01 DIAGNOSIS — C569 Malignant neoplasm of unspecified ovary: Secondary | ICD-10-CM

## 2016-07-02 ENCOUNTER — Encounter: Payer: Self-pay | Admitting: Oncology

## 2016-07-02 ENCOUNTER — Encounter: Payer: Self-pay | Admitting: Pharmacist

## 2016-07-02 ENCOUNTER — Telehealth: Payer: Self-pay | Admitting: Pharmacist

## 2016-07-02 ENCOUNTER — Ambulatory Visit (HOSPITAL_BASED_OUTPATIENT_CLINIC_OR_DEPARTMENT_OTHER): Payer: BC Managed Care – PPO

## 2016-07-02 ENCOUNTER — Ambulatory Visit (HOSPITAL_BASED_OUTPATIENT_CLINIC_OR_DEPARTMENT_OTHER): Payer: BC Managed Care – PPO | Admitting: Oncology

## 2016-07-02 VITALS — BP 109/88 | HR 87 | Temp 98.5°F | Resp 18 | Ht 65.0 in | Wt 261.6 lb

## 2016-07-02 DIAGNOSIS — D6481 Anemia due to antineoplastic chemotherapy: Secondary | ICD-10-CM

## 2016-07-02 DIAGNOSIS — Z452 Encounter for adjustment and management of vascular access device: Secondary | ICD-10-CM

## 2016-07-02 DIAGNOSIS — T451X5A Adverse effect of antineoplastic and immunosuppressive drugs, initial encounter: Secondary | ICD-10-CM

## 2016-07-02 DIAGNOSIS — D701 Agranulocytosis secondary to cancer chemotherapy: Secondary | ICD-10-CM | POA: Diagnosis not present

## 2016-07-02 DIAGNOSIS — C778 Secondary and unspecified malignant neoplasm of lymph nodes of multiple regions: Secondary | ICD-10-CM | POA: Diagnosis not present

## 2016-07-02 DIAGNOSIS — C561 Malignant neoplasm of right ovary: Secondary | ICD-10-CM

## 2016-07-02 DIAGNOSIS — C562 Malignant neoplasm of left ovary: Secondary | ICD-10-CM

## 2016-07-02 DIAGNOSIS — C569 Malignant neoplasm of unspecified ovary: Secondary | ICD-10-CM

## 2016-07-02 DIAGNOSIS — Z95828 Presence of other vascular implants and grafts: Secondary | ICD-10-CM

## 2016-07-02 DIAGNOSIS — G620 Drug-induced polyneuropathy: Secondary | ICD-10-CM

## 2016-07-02 LAB — COMPREHENSIVE METABOLIC PANEL
ALBUMIN: 3.8 g/dL (ref 3.5–5.0)
ALK PHOS: 67 U/L (ref 40–150)
ALT: 11 U/L (ref 0–55)
AST: 16 U/L (ref 5–34)
Anion Gap: 11 mEq/L (ref 3–11)
BILIRUBIN TOTAL: 0.38 mg/dL (ref 0.20–1.20)
BUN: 12.2 mg/dL (ref 7.0–26.0)
CALCIUM: 10.1 mg/dL (ref 8.4–10.4)
CO2: 26 mEq/L (ref 22–29)
Chloride: 104 mEq/L (ref 98–109)
Creatinine: 0.7 mg/dL (ref 0.6–1.1)
Glucose: 91 mg/dl (ref 70–140)
POTASSIUM: 4.1 meq/L (ref 3.5–5.1)
Sodium: 140 mEq/L (ref 136–145)
Total Protein: 7 g/dL (ref 6.4–8.3)

## 2016-07-02 LAB — CBC WITH DIFFERENTIAL/PLATELET
BASO%: 0.6 % (ref 0.0–2.0)
BASOS ABS: 0 10*3/uL (ref 0.0–0.1)
EOS ABS: 0.1 10*3/uL (ref 0.0–0.5)
EOS%: 1.4 % (ref 0.0–7.0)
HEMATOCRIT: 38.1 % (ref 34.8–46.6)
HEMOGLOBIN: 12.8 g/dL (ref 11.6–15.9)
LYMPH#: 1.8 10*3/uL (ref 0.9–3.3)
LYMPH%: 24 % (ref 14.0–49.7)
MCH: 32 pg (ref 25.1–34.0)
MCHC: 33.5 g/dL (ref 31.5–36.0)
MCV: 95.5 fL (ref 79.5–101.0)
MONO#: 0.5 10*3/uL (ref 0.1–0.9)
MONO%: 7.1 % (ref 0.0–14.0)
NEUT#: 4.9 10*3/uL (ref 1.5–6.5)
NEUT%: 66.9 % (ref 38.4–76.8)
Platelets: 285 10*3/uL (ref 145–400)
RBC: 3.99 10*6/uL (ref 3.70–5.45)
RDW: 13.9 % (ref 11.2–14.5)
WBC: 7.4 10*3/uL (ref 3.9–10.3)

## 2016-07-02 MED ORDER — SODIUM CHLORIDE 0.9 % IJ SOLN
10.0000 mL | INTRAMUSCULAR | Status: DC | PRN
  Administered 2016-07-02: 10 mL via INTRAVENOUS
  Filled 2016-07-02: qty 10

## 2016-07-02 MED ORDER — HEPARIN SOD (PORK) LOCK FLUSH 100 UNIT/ML IV SOLN
500.0000 [IU] | Freq: Once | INTRAVENOUS | Status: AC | PRN
  Administered 2016-07-02: 500 [IU] via INTRAVENOUS
  Filled 2016-07-02: qty 5

## 2016-07-02 MED ORDER — NIRAPARIB TOSYLATE 100 MG PO CAPS
300.0000 mg | ORAL_CAPSULE | Freq: Every day | ORAL | Status: DC
Start: 2016-07-02 — End: 2016-08-23

## 2016-07-02 MED ORDER — LORAZEPAM 1 MG PO TABS: 1.0000 mg | ORAL_TABLET | Freq: Three times a day (TID) | ORAL | Status: DC

## 2016-07-02 NOTE — Progress Notes (Signed)
I was able to submit PA request online on [email or web address] and received notification that the PA has been faxed to the plan. The plan will receive your PA as a paper copy.  The plan will respond with the PA determination directly to your office, usually via fax.  Plan response time varies, but one to five business days is typical. If you haven't heard from the plan after that timeframe, or if you have any questions about your submission, please contact the plan directly at the number listed on the top of the PA request.  --> PA referral # [id number]  Kennith Center, Pharm.D., CPP 07/02/2016@11 :17 AM [facility]

## 2016-07-02 NOTE — Progress Notes (Signed)
OFFICE PROGRESS NOTE   July 04, 2016   Physicians :[name], [name], MD (PCP [place]) , [name]   Communication with gyn oncology and [facility] research prior to visit including studies considered and present recommendation for niraparib. Communication with [facility] pharmacist prior to this visit to begin process for niraparib.   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 Most recent treatment has been [place] taxotere avastin, 5 cycles chemo + 3 cycles avastin thru 05-10-16. Plan is to change to niraparib, preauthorization in progress.  Due to increase in CA 125 marker from 63 on 04-19-16 to 89 on 05-10-16 and 197 on 05-21-16, Repeat imaging with PET 05-31-16 then CT CAP on 06-14-16 was done, with hypermetabolic uptake in paracolic gutters, right adnexa, vaginal cuff but nonmeasurable disease. With nonmeasurable disease she was not eligible for [id number] Nivolumab/ Ipilimumab study and likewise not eligible for another study at [facility]. She saw Dr Alycia Rossetti on 06-27-16, recommendation for niraparib based on FDA criteria.   Patient has felt progressively better in short break off of treatment. She had GI symptoms~ 3 days ago, which she felt was food poisoning, tho no one else ill; bowels have been back to normal for last 36 hrs and were ok with some manageable constipation prior to that event. Appetite and taste are improving. She began water aerobics 3x weekly about 3 weeks ago, already can tell significant improvement in peripheral neuropathy with this, essentially no neuropathy symptoms in hands or feet now. She continues gabapentin at hs, may try without this. She has had no fever or symptoms of infection, and only minimal sinus congestion with known grass allergies in summer. Bladder ok, no cough, no problems with PAC, no LE swelling, no bleeding, no abdominal or pelvic discomfort  Remainder of 10 point  Review of Systems negative.  Power PAC placed at [facility] 09-09-15, flushed 07-02-16 Genetics testing 10-27-15 normal ([facility] panel). 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  She has trip to [facility] July 20-25   ONCOLOGIC HISTORY Patient initially developed constipation and abdominal pain summer 2016 which she thought was stress related, then worsening abdominal and pelvic pain, some back pain and some fever; she had weight gain ~ 30 lbs in prior 5-6 months. She was seen at [facility] with concern for acute appendicitis. She was evaluated at local ED, I believe [facility], with CT reportedly showing large left and small right pleural effusions, moderate ascites, complex septated cystic mass in low pelvis into left abdomen 13 x 7 x 7 cm, adenopathy Including large diaphragmatic lymph nodes anterior to heart and numerous mesenteric nodes, and apparent carcinomatosis with implants in omentum up to 3.8 cm. She was seen by Dr [name] and referred to Dr Alycia Rossetti. At Dr Elenora Gamma exam 08-31-15 there was large mass in cul de sac and abdominal fluid wave, decreased BS left lower 1/2 of chest. Lab studies included CA 125 33,145; inhibin B <10; CEA <0.5, AFP 2.2, LDH 1281 and HCG negative. Surgery by Dr Alycia Rossetti at [facility] on 09-06-15 was exploratory laparotomy with supracervical hysterectomy, BSO, omentectomy, resection with primary reanastomosis of rectosigmoid and peritoneal stripping. Intraoperative findings included 3 liters of bloody ascites, 12 cm omental mass adherent to uterus, sigmoid colon and rectum which was resected en bloc, bladder nodule resected. At completion of surgery there was residual tumor along entirety of small bowel mesentery  with multiple nodules up to 1.5 cm, miliary disease on diaphragm, and peritoneal nodularity along pelvis and rectum (R2 resection). 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 No nodes submitted.  She was transfused 2 units PRBCs on each 9-9 and 09-13-15 for hemoglobin as low as 6.8 - 7.1. She had urgent right thoracentesis for 750 cc on 09-08-15 with cytology positive for high grade serous carcinoma ([id number]) and left thoracentesis for 400 cc on 09-09-15. CT angio chest on POD 1 was negative for PE. She tolerated morphine by PCA post operatively. She had mild cellulitis at abdominal incision on day of DC, begun on Keflex then. Psychiatry saw in hospital, begun on zoloft. She was discharged home with 28 days of lovenox. She saw Dr Alycia Rossetti on 09-19-15 with staples removed, however wound opened that evening. She was seen by gyn onc provider on 09-23-15, with wound open 3 x 5 cm, 3 cm deep, no evidence of infection. Wet to dry dressings bid were begun, ongoing. She had follow up wound check on 09-27-15, no findings of concern. GOG protocol at [facility] had delay, so that treatment given off protocol with first cycle of carboplatin taxol at [facility] on 09-26-15. She had additional 5 cycles of carboplatin taxol at [facility] thru 12-19-15. Restaging CT CAP showed no diffuse peritoneal disease remaining, small ascites and small right pleural effusion, thickened retrocecal appendix, right external iliac node 0.7 cm. Chemo continued with 2 cycles of carboplatin taxotere thru 02-27-16. Avastin was added cycle 3,4,5 thru 05-10-16. CA 125 marker from 63 on 04-19-16 to 89 on 05-10-16 and 197 on 05-21-16. Repeat imaging with PET 05-31-16 then CT CAP on 06-14-16 had nonmeasurable disease based on hypermetabolic uptake in paracolic gutters, right adnexa, vaginal cuff. With nonmeasurable disease she was not eligible for [id number]-[id number] Nivolumab/ Ipilimumab study and likewise not eligible for another study at [facility]. She meets FDA eligibility for niraparib, now planned as next intervention. CA 125 807 on 07-02-16 as baseline for niraparib.    Objective:  Vital signs in last 24 hours:  BP 109/88 mmHg  Pulse 87  Temp(Src) 98.5 F (36.9 C) (Oral)  Resp 18  Ht 5' 5"  (1.651 m)  Wt 261 lb 9.6 oz (118.661 kg)  BMI 43.53 kg/m2  SpO2 100% Weight up 6 lbs Alert, oriented and appropriate. Ambulatory without difficulty. Looks comfortable and more energetic, respirations not labored, color good  Alopecia  HEENT:PERRL, sclerae not icteric. Oral mucosa moist without lesions, posterior pharynx clear.  Neck supple. No JVD.  Lymphatics:no cervical,suraclavicular, axillary or inguinal adenopathy Resp: clear to auscultation bilaterally and normal percussion bilaterally Cardio: regular rate and rhythm. No gallop. GI: soft, nontender, not distended, no mass or organomegaly. Normally active bowel sounds. Surgical incision not remarkable. Musculoskeletal/ Extremities: without pitting edema, cords, tenderness Neuro: no significant peripheral neuropathy. Otherwise nonfocal. PSYCH appropriate mood and affect Skin without rash, ecchymosis, petechiae Portacath-without erythema or tenderness  Lab Results:  Results for orders placed or performed in visit on 07/02/16  CBC with Differential  Result Value Ref Range   WBC 7.4 3.9 - 10.3 10e3/uL   NEUT# 4.9 1.5 - 6.5 10e3/uL   HGB 12.8 11.6 - 15.9 g/dL   HCT 38.1 34.8 - 46.6 %   Platelets 285 145 - 400 10e3/uL   MCV 95.5 79.5 - 101.0 fL   MCH 32.0 25.1 - 34.0 pg   MCHC 33.5 31.5 -  36.0 g/dL   RBC 3.99 3.70 - 5.45 10e6/uL   RDW 13.9 11.2 - 14.5 %   lymph# 1.8 0.9 - 3.3 10e3/uL   MONO# 0.5 0.1 - 0.9 10e3/uL   Eosinophils Absolute 0.1 0.0 - 0.5 10e3/uL   Basophils Absolute 0.0 0.0 - 0.1 10e3/uL   NEUT% 66.9 38.4 - 76.8 %   LYMPH% 24.0 14.0 - 49.7 %   MONO% 7.1 0.0 - 14.0 %   EOS% 1.4 0.0 - 7.0 %   BASO% 0.6 0.0 - 2.0 %  Comprehensive metabolic panel  Result Value Ref Range   Sodium 140 136 - 145 mEq/L   Potassium 4.1 3.5 - 5.1 mEq/L   Chloride 104 98 - 109 mEq/L   CO2 26 22 - 29  mEq/L   Glucose 91 70 - 140 mg/dl   BUN 12.2 7.0 - 26.0 mg/dL   Creatinine 0.7 0.6 - 1.1 mg/dL   Total Bilirubin 0.38 0.20 - 1.20 mg/dL   Alkaline Phosphatase 67 40 - 150 U/L   AST 16 5 - 34 U/L   ALT 11 0 - 55 U/L   Total Protein 7.0 6.4 - 8.3 g/dL   Albumin 3.8 3.5 - 5.0 g/dL   Calcium 10.1 8.4 - 10.4 mg/dL   Anion Gap 11 3 - 11 mEq/L   EGFR >90 >90 ml/min/1.73 m2  CA 125  Result Value Ref Range   Cancer Antigen (CA) 125 806.9 (H) 0.0 - 38.1 U/mL    CA 125 available after visit, this having been 197 on 05-21-16  Studies/Results: EXAM: CT CHEST, ABDOMEN, AND PELVIS WITH CONTRAST  06-14-16  COMPARISON: PET of 05/31/2016. Abdominal pelvic CT of 03/16/2016. Most recent chest CT of 01/20/2016.  FINDINGS: RECIST 1.1  Target Lesions:  1. None 2. Non-target Lesions:  1. Thickening of both paracolic gutters, and along the right hepatic capsule. 2. Trace right pleural thickening. CT CHEST FINDINGS  Mediastinum/Lymph Nodes: No supraclavicular adenopathy. A right Port-A-Cath terminates at the high right atrium. No axillary adenopathy. Normal heart size, without pericardial effusion. No central pulmonary embolism, on this non-dedicated study. No mediastinal or hilar adenopathy. Soft tissue density in the anterior mediastinum is likely residual thymus.  Lungs/Pleura: No pleural fluid. Trace right pleural thickening is similar. Azygos fissure.  Clear lungs.  Musculoskeletal: No acute osseous abnormality.  CT ABDOMEN PELVIS FINDINGS  Hepatobiliary: Normal liver. Normal gallbladder, without biliary ductal dilatation.  Pancreas: Normal, without mass or ductal dilatation.  Spleen: Hypo attenuating foci along the splenic capsule, including superiorly at 9 mm on image 41/series 2. Similar to 03/16/2016 (when remeasured). No hypermetabolism in this area on prior PET.  Adrenals/Urinary Tract: Normal adrenal glands. Normal kidneys, without hydronephrosis.  Normal urinary bladder.  Stomach/Bowel: Normal stomach, without wall thickening. Colonic stool burden suggests constipation. Normal terminal ileum. Suspect appendiceal enlargement of 11 mm on image 85/ series 2. This is difficult to differentiate from the adjacent thickening of the right paracolic gutter. No surrounding inflammation. The appendix measured 10 mm on the prior PET and the CT of 03/16/2016.  Normal small bowel caliber.  Vascular/Lymphatic: Normal caliber of the aorta and branch vessels. No abdominopelvic adenopathy.  Reproductive: Hysterectomy. No adnexal mass.  Other: No significant free fluid. Thickening of the right paracolic gutter including on image 77/series 2. This is similar to on the most recent CT but new or increased since 03/16/2016 CT. Similarly, there is left paracolic gutter thickening on image 64/series 2 which is similar to the most recent PET.  No well-defined omental nodule.  Right hepatic lobe capsular irregularity measures 8 mm on image 54/series 2 and is unchanged compared to 03/16/2016 CT.  Musculoskeletal: Left iliac sclerotic lesion is unchanged and likely a bone island. No suspicious osseous lesion.  IMPRESSION: 1. Thickening of both paracolic gutters, similar to most recent PET but new or progressive since 03/16/2016. Right hepatic capsular irregularity is similar back to 03/16/2016. 2. No new sites of disease identified. 3. Enlargement of the appendix, contiguous with the right paracolic gutter thickening. No surrounding inflammation. Possibly related to surrounding peritoneal metastasis. Correlate with right lower quadrant symptoms and recommend attention on follow-up. 4. No evidence of thoracic metastatic disease. 5. Probable incidental splenic lesions. Given capsular location, recommend attention on follow-up to exclude less likely foci of peritoneal metastasis.   EXAM: NUCLEAR MEDICINE PET SKULL BASE TO THIGH   05-31-16  COMPARISON: Multiple exams, including 03/16/2016 and 01/20/2016  FINDINGS: NECK  No hypermetabolic lymph nodes in the neck.  CHEST  No hypermetabolic mediastinal or hilar nodes. No suspicious pulmonary nodules on the CT scan. Right Port-A-Cath tip: Cavoatrial junction. Incidental azygos fissure.  ABDOMEN/PELVIS  Abnormal peritoneal metastatic disease. Faint nodularity is best seen along the anterior inferior liver edge for example on images 109-112 series 4, with this nodularity having a maximum standard uptake value of 10.1. Focal activity along the posterior hepatic margin has maximum standard uptake value of 8.1. In contrast, the liver parenchymal activity has maximum standard uptake value of 4.6.  There is abnormal hypermetabolic activity and mild thickening in both paracolic gutters. On the right, a focus of this hypermetabolic activity has a maximum SUV of 5.5. In the left lower quadrant just below the level of the iliac crests, a focus of hypermetabolic activity in the left paracolic gutter adjacent to the junction of the descending and sigmoid colon has a maximum standard uptake value 8.0.  Along the right adnexa, there is a focus of high activity with maximum SUV 10.7. Also, along the vaginal cuff there is a focus of high activity maximum SUV 12.4.  No large intra-abdominal mass is seen.  SKELETON  Faint diffuse hypermetabolic activity in the bony structures, query marrow stimulation.  IMPRESSION: 1. Scattered peritoneal foci of metastatic disease are hypermetabolic. These are most notable along the liver margin, paracolic gutters, and vaginal cuff.   PACs images CTs and PET reviewed by MD for this visit.   Medications: I have reviewed the patient's current medications. Niraparib reviewed: will begin at 300 mg daily, give at hs due to possible nausea, CBC weekly first 4 weeks and follow chemistries.   Patient had teaching also by  [facility] oral chemo pharmacist at time of this visit.   DISCUSSION  Interval history reviewed.  Niraparib major potential side effects reviewed, including HTN, fatigue, GI, cytopenias (particularly thrombocytopenia), elevated LFTs, rare MDS/ AML. Patient is in full agreement with pursuing niraparib and tells me that she is hopeful that this will be beneficial, tho she is aware that it will not be curative. Timing of start will depend on preauth, which we hope will be done by end of this week (with 7-4 holiday), then time for drug delivery (likely via [place]). She understands that she will need labs weekly x 4 for first month then at least monthly; my appointments to be added when we know start date.  Note trip to [facility] July 20-25.  Verbal consent obtained.   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 Malignant pleural fluid and ascites at presentation. Persistent disease after 6 cycles carbo taxol, changed to [place] taxotere due to neuropathy, avastin added beginning cycle 3 03-26-16. CT AP 03-16-16 stable. Carbo taxotere avastin continued thru cycle 5 chemo on 05-10-16. Restaging done due to increasing CA 125, with hypermetabolic uptake by PET but no measurable disease on CT such that she was not eligible for nivolumab trial or another [facility] trial. Plan now to begin niraparib/ zejula if preauthorization obtained.  Genetics testing negative for germline BRCA mutation.  Foundation One sent from [facility] on surgical path as above, not eligible for related trials now but may be option in future. 2.peripheral neuropathy essentially resolved off of taxane and doing water aerobics. May change gabapentin to prn 3. Some GI symptoms in past week, otherwise constipation better controlled with miralax and diet 4.power PAC, flushed 07-02-16 5.chemo neutropenia, including cycle 3 carbo  taxotere when on pro neulasta did not function. Resolved.  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 7.mild chemo anemia improved,  iron studies ok 09-30-15 8.obesity: BMI 42 9.Surgical menopause with hot flashes 10.multifactorial fatigue with steroid myopathy: also improving off of chemo and with exercise program   All questions answered.  Patient will be sure this office knows when she obtains drug and when she begins, as labs and MD visits still to be set up. Time spent 40 min including >50% counseling and coordination of care. Route PCP, cc Dr Cammie Mcgee, MD   07/04/2016, 9:00 AM

## 2016-07-02 NOTE — Progress Notes (Signed)
Oral Chemotherapy Pharmacist Encounter  I spoke with patient for overview of new oral chemotherapy medication: Zejula.  The prescription has been sent to the [facility] outpatient pharmacy. As noted earlier today, we still await PA from [place].  I counseled patient on administration, dosing, side effects, safe handling, and monitoring. Side effects include but not limited to: myelosuppression, hypertension, arrhythmia, nausea. She voiced understanding and appreciation. She has already been researching the drug and is aware of these side effects.  All current questions answered.  I provided pt w/ a handout on Zejula.  Will follow up on 07/04/16 regarding insurance PA and pharmacy.  Pt is going out of town until this weekend.  If [facility] is able to fill the Rx, she will send a family member to p/u and would begin tx on Sun 07/08/16.  [facility] aware. Once she starts Zejula, we will follow up in 1 week for adherence and toxicity management.   Thank you, Kennith Center, Pharm.D., CPP 07/02/2016@3 :03 PM Oral Chemotherapy Clinic

## 2016-07-02 NOTE — Patient Instructions (Signed)

## 2016-07-02 NOTE — Progress Notes (Signed)
Note the Quick Start option for Zejula (15 day supply) only applies if a pt is experiencing an insurance coverage delay of 5 business days or more. If needed, we can apply for this on 07/09/16.  Hopefully we'll have the PA by then (one day delay due to holiday already). Kennith Center, Pharm.D., CPP 07/02/2016@10 :46 AM

## 2016-07-02 NOTE — Progress Notes (Signed)
In an attempt to get an early start on the [place] (niraparib) new RX, I have been in contact w/ [facility] and they have pt loaded in their system, ready to fill the Zejula Rx.  However, a prior Josem Kaufmann is required. I contacted [facility] (Ph# [phone] or [phone]) and they do not have Zejula listed in their formulary and the representative I s/w has passed a request to "senior management" to build the drug into their formulary.  The rep requested that we call back in 24 hours to carry out the PA. I alerted [facility] of this and we will be in touch w/ them as soon as we get the PA. If this fails, there is a program w/ Diplomatic Services operational officer, [employer]) called Together w/ Jorene Minors that offers a quick start and bridge to tx - providers can Rx a 15-day supply at no cost in the event of insurance coverage delay.  We have enrollment forms in the oral chemo clinic manuf assistance files if needed or also can obtain online: [email or web address]  Kennith Center, Pharm.D., CPP 07/02/2016@10 :33 AM Oral Chemo Clinic

## 2016-07-03 LAB — CA 125: Cancer Antigen (CA) 125: 806.9 U/mL — ABNORMAL HIGH (ref 0.0–38.1)

## 2016-07-04 ENCOUNTER — Telehealth: Payer: Self-pay | Admitting: Pharmacist

## 2016-07-04 NOTE — Telephone Encounter (Addendum)
Called to f/u on Zejula Prior Authorization. I was informed on multiple occasions that there was not a PA in the system for this medication for this patient. I gave them the PA reference # (PA referral # [id number]) from 07/02/16 from Kennith Center, PharmD. I was transferred to [facility] in the [place] pharmacy department. She stated that PA reference number was not a part of [facility] and likely the PA number provided was a  PA# for the patient's medical health plan, not pharmacy benefits with [facility].  I completed the PA over the phone with Patti at [place]. (Phone:  [phone]). She said from the information that we discussed over the phone, the PA appeared marked as approved  but the information would need to go to the pharmacist for final verification. This could take 24-48 hours. She suggested I call back in 24hours.  Precious Bard thought the final PA determination would be made by that time. The final determination result will also be faxed to [phone]. Pending PA Authorization number from [facility]:  [id number].  Reference this PA number when calling to f/u. Will f/u PA authorization status on 07/05/16 as instructed.  Raul Del, PharmD, BCPS, [facility] [phone]

## 2016-07-04 NOTE — Telephone Encounter (Signed)
See Telephone note from 07/04/16 from Oral Oncology Clinic.

## 2016-07-05 ENCOUNTER — Other Ambulatory Visit: Payer: BC Managed Care – PPO

## 2016-07-05 ENCOUNTER — Encounter: Payer: Self-pay | Admitting: Oncology

## 2016-07-05 ENCOUNTER — Ambulatory Visit: Payer: BC Managed Care – PPO | Admitting: Oncology

## 2016-07-05 NOTE — Progress Notes (Signed)
Per insurance niraparib is not reimb. I will let dr. Marko Plume know to see if an alternative is available.

## 2016-07-06 ENCOUNTER — Other Ambulatory Visit: Payer: Self-pay | Admitting: Oncology

## 2016-07-06 ENCOUNTER — Telehealth: Payer: Self-pay | Admitting: Oncology

## 2016-07-06 MED FILL — *ZEJULA 100 MG CAPSULE: 100 | 30 days supply | Qty: 90 | Fill #0

## 2016-07-06 NOTE — Progress Notes (Signed)
07/06/16-  I received notification that Zejula was not approved per insurance.  I contacted [facility] and the representative informed me the medication was approved and he ran a mock prescription to determine the copay.  The copay came out at $2000 he informed me we could submit another request to appeal the copay and start a benefit review to determine medical necessity and attempt at a lower copay.  However, I was able to locate a copay card and faxed to [facility].  Received notification from [facility] that the copay card worked and the copay went to $0.  Sheena Miles is ordering the drug for arrival Monday 07/09/16 and will contact the patient which at that time will notify us so we may begin tracking on a clinical side.  The PA for the drug is [id number].  Thank you  Henreitta Leber, PharmD Oral On[facility]

## 2016-07-06 NOTE — Telephone Encounter (Signed)
Spoke with patient to confirm 7/17 appt date/times per LL 7/7 pof

## 2016-07-09 ENCOUNTER — Telehealth: Payer: Self-pay

## 2016-07-09 ENCOUNTER — Encounter: Payer: Self-pay | Admitting: Pharmacist

## 2016-07-09 NOTE — Telephone Encounter (Signed)
Sheena Miles called stating that she has severe nausea and vomiting last night from ~9 pm to ~0630 this am.   She experienced abdominal cramping. Vomiting was forceful at times. Experiencing some cramping after eating. Temp 99.0 today.  No urinary burning, frequency, or pressure. Did have 2 solid BM last night.  Reviewed with Dr. Marko Plume. Dr. Marko Plume  said to take clear liquids only for next 24 hrs.   Use miralax  to keep bowels moving. If she experiences any vomiting while taking in fluids, she needs to call the office. She is to call on Wednesday am to update Dr. Marko Plume on her condition. She is not  to not start the Zejula (niraparib at this time. Sheena Miles verbalized understanding.

## 2016-07-09 NOTE — Progress Notes (Signed)
[facility] has pts Zejula Rx ready for pick up ($0 copay).  Noted pt has had N/V this weekend and MD advised not to begin Zejula just yet.  We'll follow along for pts condition to begin Zejula and call her 1 week after starting.  Kennith Center, Pharm.D., CPP 07/09/2016@3 :24 PM Oral Chemo Clinic

## 2016-07-11 NOTE — Telephone Encounter (Signed)
Sheena Miles called this am and is feeling much better.  No vomiting since Monday.   She did clear liquids for 24 hrs and ate some solid food yesterday.  She did experience some abdominal cramping.  She feels she needs to be more careful of wht she eats. She is using miralax and colace. Has had 2 moderate BM.

## 2016-07-11 NOTE — Telephone Encounter (Signed)
LM for Ms Plunk stating that Dr. Marko Plume said that she coud begin the [facility]. Take as directed by pharmacist Kennith Center in [facility]. Keep appointment on 07-16-16 as scheduled.  Call prior to appointment if any problems or concerns.

## 2016-07-15 ENCOUNTER — Other Ambulatory Visit: Payer: Self-pay | Admitting: Oncology

## 2016-07-16 ENCOUNTER — Telehealth: Payer: Self-pay | Admitting: Internal Medicine

## 2016-07-16 ENCOUNTER — Encounter (HOSPITAL_COMMUNITY): Payer: Self-pay | Admitting: Orthopedic Surgery

## 2016-07-16 ENCOUNTER — Other Ambulatory Visit: Payer: BC Managed Care – PPO

## 2016-07-16 ENCOUNTER — Inpatient Hospital Stay (HOSPITAL_COMMUNITY)
Admission: EM | Admit: 2016-07-16 | Discharge: 2016-07-20 | DRG: 389 | Disposition: A | Discharge status: Home / Self Care | Payer: BC Managed Care – PPO | Source: Other Acute Inpatient Hospital | Attending: Internal Medicine | Admitting: Internal Medicine

## 2016-07-16 ENCOUNTER — Ambulatory Visit: Payer: BC Managed Care – PPO | Admitting: Oncology

## 2016-07-16 DIAGNOSIS — K56600 Partial intestinal obstruction, unspecified as to cause: Secondary | ICD-10-CM | POA: Diagnosis present

## 2016-07-16 DIAGNOSIS — R109 Unspecified abdominal pain: Secondary | ICD-10-CM | POA: Diagnosis present

## 2016-07-16 DIAGNOSIS — R1112 Projectile vomiting: Secondary | ICD-10-CM | POA: Diagnosis not present

## 2016-07-16 DIAGNOSIS — Z6841 Body Mass Index (BMI) 40.0 and over, adult: Secondary | ICD-10-CM | POA: Diagnosis not present

## 2016-07-16 DIAGNOSIS — C562 Malignant neoplasm of left ovary: Secondary | ICD-10-CM

## 2016-07-16 DIAGNOSIS — E669 Obesity, unspecified: Secondary | ICD-10-CM | POA: Diagnosis present

## 2016-07-16 DIAGNOSIS — J45909 Unspecified asthma, uncomplicated: Secondary | ICD-10-CM | POA: Diagnosis present

## 2016-07-16 DIAGNOSIS — C561 Malignant neoplasm of right ovary: Secondary | ICD-10-CM | POA: Diagnosis present

## 2016-07-16 DIAGNOSIS — C569 Malignant neoplasm of unspecified ovary: Secondary | ICD-10-CM | POA: Diagnosis present

## 2016-07-16 DIAGNOSIS — R188 Other ascites: Secondary | ICD-10-CM | POA: Diagnosis present

## 2016-07-16 DIAGNOSIS — Z79899 Other long term (current) drug therapy: Secondary | ICD-10-CM

## 2016-07-16 DIAGNOSIS — R112 Nausea with vomiting, unspecified: Secondary | ICD-10-CM | POA: Diagnosis present

## 2016-07-16 DIAGNOSIS — K56609 Unspecified intestinal obstruction, unspecified as to partial versus complete obstruction: Secondary | ICD-10-CM

## 2016-07-16 DIAGNOSIS — J90 Pleural effusion, not elsewhere classified: Secondary | ICD-10-CM

## 2016-07-16 DIAGNOSIS — Z7901 Long term (current) use of anticoagulants: Secondary | ICD-10-CM

## 2016-07-16 DIAGNOSIS — R0602 Shortness of breath: Secondary | ICD-10-CM

## 2016-07-16 DIAGNOSIS — K5669 Other intestinal obstruction: Secondary | ICD-10-CM | POA: Diagnosis not present

## 2016-07-16 LAB — CBC
HCT: 37.9 % (ref 36.0–46.0)
Hemoglobin: 12.5 g/dL (ref 12.0–15.0)
MCH: 31.4 pg (ref 26.0–34.0)
MCHC: 33 g/dL (ref 30.0–36.0)
MCV: 95.2 fL (ref 78.0–100.0)
PLATELETS: 290 10*3/uL (ref 150–400)
RBC: 3.98 MIL/uL (ref 3.87–5.11)
RDW: 13.3 % (ref 11.5–15.5)
WBC: 6.2 10*3/uL (ref 4.0–10.5)

## 2016-07-16 LAB — CREATININE, SERUM
CREATININE: 0.85 mg/dL (ref 0.44–1.00)
GFR calc Af Amer: >60 mL/min (ref >60)

## 2016-07-16 MED ORDER — ONDANSETRON HCL 4 MG/2ML IJ SOLN
4.0000 mg | Freq: Four times a day (QID) | INTRAMUSCULAR | Status: DC | PRN
  Administered 2016-07-16 – 2016-07-19 (×3): 4 mg via INTRAVENOUS
  Filled 2016-07-16 (×3): qty 2

## 2016-07-16 MED ORDER — ONDANSETRON HCL 4 MG PO TABS
4.0000 mg | ORAL_TABLET | ORAL | Status: DC | PRN
Start: 2016-07-16 — End: 2016-07-20
  Administered 2016-07-17 – 2016-07-19 (×2): 4 mg via ORAL
  Filled 2016-07-16 (×2): qty 1

## 2016-07-16 MED ORDER — HYDROCORTISONE 2.5 % RE CREA
1.0000 "application " | TOPICAL_CREAM | Freq: Two times a day (BID) | RECTAL | Status: DC | PRN
  Filled 2016-07-16: qty 28.35

## 2016-07-16 MED ORDER — MORPHINE SULFATE (PF) 2 MG/ML IV SOLN: 2.0000 mg | INTRAVENOUS | Status: DC | PRN

## 2016-07-16 MED ORDER — FAMOTIDINE IN NACL 20-0.9 MG/50ML-% IV SOLN
20.0000 mg | Freq: Two times a day (BID) | INTRAVENOUS | Status: DC
  Administered 2016-07-16 – 2016-07-19 (×8): 20 mg via INTRAVENOUS
  Filled 2016-07-16 (×9): qty 50

## 2016-07-16 MED ORDER — NIRAPARIB TOSYLATE 100 MG PO CAPS: 300.0000 mg | ORAL_CAPSULE | Freq: Every day | ORAL | Status: DC

## 2016-07-16 MED ORDER — HYDROMORPHONE HCL 2 MG/ML IJ SOLN
2.0000 mg | INTRAMUSCULAR | Status: DC | PRN
  Administered 2016-07-16 – 2016-07-17 (×2): 2 mg via INTRAVENOUS
  Filled 2016-07-16 (×2): qty 1

## 2016-07-16 MED ORDER — SODIUM CHLORIDE 0.9% FLUSH
10.0000 mL | INTRAVENOUS | Status: DC | PRN
  Administered 2016-07-17 – 2016-07-20 (×3): 10 mL
  Filled 2016-07-16 (×3): qty 40

## 2016-07-16 MED ORDER — ACETAMINOPHEN 325 MG PO TABS
650.0000 mg | ORAL_TABLET | Freq: Four times a day (QID) | ORAL | Status: DC | PRN
  Administered 2016-07-16 – 2016-07-19 (×5): 650 mg via ORAL
  Filled 2016-07-16 (×5): qty 2

## 2016-07-16 MED ORDER — POTASSIUM CHLORIDE IN NACL 20-0.9 MEQ/L-% IV SOLN
INTRAVENOUS | Status: DC
  Administered 2016-07-16: 21:00:00 via INTRAVENOUS
  Administered 2016-07-16 – 2016-07-17 (×3): 100 mL/h via INTRAVENOUS
  Administered 2016-07-18 – 2016-07-19 (×3): via INTRAVENOUS
  Filled 2016-07-16 (×13): qty 1000

## 2016-07-16 MED ORDER — NYSTATIN 100000 UNIT/GM EX POWD
1.0000 g | Freq: Every day | CUTANEOUS | Status: DC
  Administered 2016-07-19: 1 g via TOPICAL
  Filled 2016-07-16: qty 15

## 2016-07-16 MED ORDER — LIDOCAINE-PRILOCAINE 2.5-2.5 % EX CREA
1.0000 "application " | TOPICAL_CREAM | CUTANEOUS | Status: DC | PRN
  Filled 2016-07-16: qty 5

## 2016-07-16 MED ORDER — ACETAMINOPHEN 650 MG RE SUPP
650.0000 mg | Freq: Four times a day (QID) | RECTAL | Status: DC | PRN
  Administered 2016-07-17: 650 mg via RECTAL
  Filled 2016-07-16: qty 1

## 2016-07-16 MED ORDER — ENOXAPARIN SODIUM 60 MG/0.6ML ~~LOC~~ SOLN
55.0000 mg | SUBCUTANEOUS | Status: DC
  Administered 2016-07-16 – 2016-07-17 (×2): 55 mg via SUBCUTANEOUS
  Filled 2016-07-16 (×3): qty 0.6

## 2016-07-16 MED ORDER — ALBUTEROL SULFATE (2.5 MG/3ML) 0.083% IN NEBU: 3.0000 mL | INHALATION_SOLUTION | Freq: Two times a day (BID) | RESPIRATORY_TRACT | Status: DC | PRN

## 2016-07-16 NOTE — Consult Note (Addendum)
Reason for Consult: Small bowel obstruction Referring Physician: Flonnie Overman Dhungel  Sheena Miles is an 30 y.o. female.  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  Several weeks ago she had a brief, self-limited episode of nausea/vomiting that she attributed to food poisoning.  Several days later nausea/vomiting recurred with associated colicky abdominal pain.  She contacted Medical Oncology and was started on a liquid diet--her symptoms were progressive and she presented to the ED at [facility]. She denies any fevers. Her last BM was yesterday.  She reports a several pound weight loss. A CT scan of the abdomen and pelvis showed high-grade small bowel obstruction with transition point in the pelvis with increased tumor bulk in right lower quadrant compared to her last CT.   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 First cycle of chemotherapy on 09/26/15. Her CA-125 was trending down --after 6 cycles of chemotherapy it as 88.  A  preop  CA 125 was. 33145, BRCA negative.  She had persistent disease after 6 cycles of paclitaxel and carboplatin with small amount of ascites on imaging as well as slightly elevated CA 125. Chemo continued with 2 cycles of carboplatin taxotere thru 02-27-16. Avastin was added cycle 3,4,5 thru 05-10-16. CA 125 marker from 63 on 04-19-16 to 89 on 05-10-16 and 197 on 05-21-16. Repeat imaging with PET 05-31-16 then CT CAP on 06-14-16 had nonmeasurable disease based on hypermetabolic uptake in paracolic gutters, right  adnexa, vaginal cuff. She was started on niraparib on 7/12.  Past Medical History  Diagnosis Date  . Allergy   . Asthma     exercise induced    Past Surgical History  Procedure Laterality Date  . Wisdom tooth extraction  2013    Family History  Problem Relation Age of Onset  . Diabetes Father   . Congestive Heart Failure Father   . Atrial fibrillation Father   . Crohn's disease Maternal Grandmother   . Breast cancer Paternal Grandmother     dx. 6s  . Skin cancer Paternal Uncle     unspecified type; dx. 35 or younger  . Heart attack Paternal Grandfather     Social History:  reports that she has never smoked. She has never used smokeless tobacco. She reports that she drinks alcohol. She reports that she does not use illicit drugs.  Allergies:  Allergies  Allergen Reactions  . Codeine Palpitations    Described by patient as "horrible panic attack feeling and shortness of breath"    Medications: I have reviewed the patient's current medications.  Results for orders placed or performed during the hospital encounter of 07/16/16 (from the past 48 hour(s))  CBC     Status: None   Collection Time: 07/16/16 11:50 AM  Result Value Ref Range   WBC 6.2 4.0 - 10.5 K/uL   RBC 3.98 3.87 - 5.11 MIL/uL   Hemoglobin 12.5 12.0 - 15.0 g/dL   HCT 37.9 36.0 - 46.0 %   MCV 95.2 78.0 - 100.0 fL   MCH 31.4 26.0 - 34.0 pg   MCHC 33.0 30.0 - 36.0 g/dL   RDW 13.3 11.5 - 15.5 %   Platelets 290  150 - 400 K/uL  Creatinine, serum     Status: None   Collection Time: 07/16/16 11:50 AM  Result Value Ref Range   Creatinine, Ser 0.85 0.44 - 1.00 mg/dL   GFR calc non Af Amer >60 >60 mL/min   GFR calc Af Amer >60 >60 mL/min    Comment: (NOTE) The eGFR has been calculated using the CKD EPI equation. This calculation has not been validated in all clinical situations. eGFR's persistently <60 mL/min signify possible Chronic Kidney Disease.   .   Review of Systems  Constitutional: Negative for  fever.  Gastrointestinal: Positive for nausea, vomiting and abdominal pain. Negative for diarrhea and constipation.   Blood pressure 113/77, pulse 96, temperature 98.8 F (37.1 C), temperature source Oral, resp. rate 16, height 5' 5"  (1.651 m), weight 251 lb (113.853 kg), SpO2 100 %. Physical Exam  Constitutional: She is oriented to person, place, and time. She appears well-developed and well-nourished.  HENT:  Head: Normocephalic.  Cardiovascular: Normal rate, regular rhythm and normal heart sounds.   Respiratory: Effort normal.  GI: Soft. She exhibits no distension. There is no tenderness. There is no rebound and no guarding.  Neurological: She is alert and oriented to person, place, and time.    Assessment/Plan: Malignant SBO in the setting of a history of an advanced staged, high grade serous ovarian cancer Symptoms controlled at present with bowel rest/antiemetics/IV hydration  >agree with continued supportive measures >consisder G tube for recalcitrant vomiting    JACKSON-MOORE,Mahonri Seiden A 07/16/2016, 9:00 PM

## 2016-07-16 NOTE — Telephone Encounter (Signed)
Called by Dr. Shirlean Kelly at [facility]: 30 yo F patient of Dr. Marko Plume with stage 4 ovarian cancer.  Presented to ED today with SBO, persistent vomiting.  Probably needs consult with Gyn/onc when she gets here to see if they need to or can resolve this surgically.  Looks like she has seen Dr. Alycia Rossetti.

## 2016-07-16 NOTE — Progress Notes (Signed)
Medical Oncology July 16, 2016, 7:59 PM  Hospital day 1 Antibiotics: none Chemotherapy: last carboplatin taxotere avastin thru 05-10-16.  Zejula (niraparib) begun 07-11-16. Will HOLD until SBO resolved.  Appreciate notification of admission of this very pleasant 30 yo lady with IVA high grade serous carcinoma of bilateral ovaries, now with SBO. EMR reviewed, discussed with Dr Clementeen Graham by phone earlier today. [facility] report not available to me presently. Patient seen, with mother and multiple family/ visitors present.   Subjective: Feeling better just having had IV dilaudid and zofran. Has walked in halls. Last bowel movement 07-15-16 small amount. Has had only small sips fluids today. She had 2 episodes of self-limited similar symptoms prior to starting Zejula. No SOB, no fever, no bleeding. She is voiding.   Power PAC placed at [facility] 09-09-15, flushed 07-02-16 Genetics testing 10-27-15 normal ([facility] panel). 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   ONCOLOGIC HISTORY Patient developed constipation and abdominal pain summer 2016; she had weight gain ~ 30 lbs in prior 5-6 months. She was seen at [facility] with concern for acute appendicitis. She was evaluated [facility] with CT reportedly showing large left and small right pleural effusions, moderate ascites, complex septated cystic mass in low pelvis into left abdomen 13 x 7 x 7 cm, adenopathy Including large diaphragmatic lymph nodes anterior to heart and numerous mesenteric nodes, and apparent carcinomatosis with implants in omentum up to 3.8 cm. She was seen by Dr [name] and referred to Dr Alycia Rossetti. At Dr Elenora Gamma exam 08-31-15 there was large mass in cul de sac and abdominal fluid wave, decreased BS left lower 1/2 of chest. Lab studies included CA 125 33,145; inhibin B <10; CEA <0.5, AFP 2.2, LDH 1281 and HCG negative. Surgery by Dr Alycia Rossetti at [facility] on 09-06-15 was exploratory laparotomy with  supracervical hysterectomy, BSO, omentectomy, resection with primary reanastomosis of rectosigmoid and peritoneal stripping. Intraoperative findings included 3 liters of bloody ascites, 12 cm omental mass adherent to uterus, sigmoid colon and rectum which was resected en bloc, bladder nodule resected. At completion of surgery there was residual tumor along entirety of small bowel mesentery with multiple nodules up to 1.5 cm, miliary disease on diaphragm, and peritoneal nodularity along pelvis and rectum (R2 resection). 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 No nodes submitted.  She was transfused 2 units PRBCs on each 9-9 and 09-13-15 for hemoglobin as low as 6.8 - 7.1. She had urgent right thoracentesis for 750 cc on 09-08-15 with cytology positive for high grade serous carcinoma ([id number]) and left thoracentesis for 400 cc on 09-09-15. CT angio chest on POD 1 was negative for PE. She tolerated morphine by PCA post operatively.  She was discharged home with 28 days of lovenox. She saw Dr Alycia Rossetti on 09-19-15 with staples removed, however wound opened that evening. She was seen by gyn onc provider on 09-23-15, with wound open 3 x 5 cm, 3 cm deep, no evidence of infection. Wet to dry dressings bid were begun, ongoing. GOG protocol at [facility] had delay, so that treatment given off protocol with first cycle of carboplatin taxol at [facility] on 09-26-15. She had additional 5 cycles of carboplatin taxol at [facility] thru 12-19-15. Restaging CT CAP showed no diffuse peritoneal disease remaining, small ascites and small right pleural effusion, thickened retrocecal appendix, right external iliac node 0.7  cm. Chemo continued with 2 cycles of carboplatin taxotere thru 02-27-16. Avastin was added cycle 3,4,5 thru 05-10-16. CA 125 marker from 63 on 04-19-16 to 89 on 05-10-16 and 197 on 05-21-16. Repeat imaging  with PET 05-31-16 then CT CAP on 06-14-16 had nonmeasurable disease based on hypermetabolic uptake in paracolic gutters, right adnexa, vaginal cuff. With nonmeasurable disease she was not eligible for NRG-GY003 Nivolumab/ Ipilimumab study and likewise not eligible for another study at [facility]. She met FDA eligibility for niraparib, begun 07-11-16. CA 125 807 on 07-02-16 as baseline for niraparib. Admitted with SBO 07-16-16.    Objective: Vital signs in last 24 hours: Blood pressure 113/77, pulse 96, temperature 98.8 F (37.1 C), temperature source Oral, resp. rate 16, height 5' 5"  (1.651 m), weight 251 lb (113.853 kg), SpO2 100 %. Alert, looks comfortable sitting up in recliner, PAC accessed with IVF infusing. PERRL, not icteric. Oral mucosa moist. Respirations not labored. Repositions easily, does not appear in significant discomfort now. LE no swelling.  Lab Results:  Recent Labs  07/16/16 1150  WBC 6.2  HGB 12.5  HCT 37.9  PLT 290   BMET  Recent Labs  07/16/16 1150  CREATININE 0.85   CBC differential and CMET ordered for AM, for first week niraparib  Studies/Results: No results found. 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 Reminded patient that pain medication can slow bowel motility. Hold Zejula. 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 Surgery and systemic treatment course as above. She is out 2 months from last avastin if surgery becomes necessary for the SBO.  BRCA negative. I have ordered CMET and CBC diff for AM, needed for monitoring first week of Zejula (that HELD now) 3.PAC in, fine to use for IVs and blood draws. 4.surgical menopause 5.prophylactic lovenox  Please page or message in EMR if I can help  between my rounds.   Pager [phone] Thank you  Evlyn Clines  MD

## 2016-07-16 NOTE — H&P (Signed)
TRH H&P   Patient Demographics:    Sheena Miles, is a 30 y.o. female  MRN: [id number]   DOB - September 22, 1986  Admit Date - 07/16/2016  Outpatient Primary MD for the patient is Rochel Brome, MD  Referring MD: Dr Marisa Severin at [facility] ED  Outpatient Specialists:  Dr Marko Plume ( oncologist) Dr Alycia Rossetti ( gyn onc)  Patient coming from: home  Chief complaint: Abdominal pain with nausea and vomiting x 10 days, worsened for 1 day    HPI:    Sheena Miles  is a 30 y.o. female, With history of stage IVA high-grade serous carcinoma bilateral ovaries with extensive disease involving the abdomen and pelvis was underwent expiratory laparotomy with debulking surgery at [facility] in September 2016 (had malignant pleural fluid and ascites on presentation) and start her on chemotherapy with persistent disease. Patient started on a new chemotherapy recently presented to [facility] ED with upper abdominal cramping associated with nausea and vomiting for almost 10 days. 10 days back patient had crampy abdominal pain with an episode of vomiting which she thought was due to food poisoning. She went to [place] for the weekend and on returning she again had abdominal cramping with an episode of vomiting (this was about 1 week back). She was started on chemotherapy 5 days back and during that time again had abdominal pain almost throughout the day. For the last few days she was having off-and-on abdominal cramping without nausea, vomiting or diarrhea. Denied any fevers or chills. Yesterday she had severe abdominal cramping mainly involving the epigastric and bilateral mid quadrant associated with 4 episodes of projectile vomiting. She then went to [facility] ED. In the ED her vitals were stable. Blood work done was unremarkable. UA was negative for UTI. A CT scan of the abdomen and pelvis showed  high-grade small bowel obstruction with transition point in the pelvis with increased tumor bulk in right lower quadrant compared to her last CT. Hospitalist at [facility] was consulted for transfer and be followed by GYN oncology here.  On my evaluation patient has mild epigastric abdominal pain but no further nausea or vomiting. Reports her abdomen feels less distended.    Review of systems:    In addition to the HPI above,  No Fever-chills, No Headache, No changes with Vision or hearing, No problems swallowing food or Liquids, No Chest pain, Cough or Shortness of Breath,  Abdominal pain,  Nausea & Vomiting,  No Blood in stool or Urine, No dysuria, No new skin rashes or bruises, No new joints pains-aches,  No new weakness, tingling, numbness in any extremity, No recent weight gain or loss, No polyuria, polydypsia or polyphagia, No significant Mental Stressors.  A full 10 point Review of Systems was done, except as stated above, all other Review of Systems were negative.   With Past History of the following :  Past Medical History  Diagnosis Date  . Allergy   . Asthma     exercise induced      Past Surgical History  Procedure Laterality Date  . Wisdom tooth extraction  2013      Social History:     Social History  Substance Use Topics  . Smoking status: Never Smoker   . Smokeless tobacco: Never Used     Comment: some hookah in college  . Alcohol Use: Yes     Comment: 1-2 nights week socially     Lives - Home  Mobility - ambulatory     Family History :     Family History  Problem Relation Age of Onset  . Diabetes Father   . Congestive Heart Failure Father   . Atrial fibrillation Father   . Crohn's disease Maternal Grandmother   . Breast cancer Paternal Grandmother     dx. 76s  . Skin cancer Paternal Uncle     unspecified type; dx. 80 or younger  . Heart attack Paternal Grandfather       Home Medications:   Prior to Admission  medications   Medication Sig Start Date End Date Taking? Authorizing Provider  ferrous fumarate (HEMOCYTE - 106 MG FE) 325 (106 FE) MG TABS tablet Take 1 tablet daily on an empty stomach with OJ. 10/21/15  Yes Lennis P Livesay, MD  gabapentin (NEURONTIN) 100 MG capsule Take 1 capsule (100 mg total) by mouth at bedtime. 01/25/16  Yes Melissa D Cross, NP  hydrocortisone (ANUSOL-HC) 2.5 % rectal cream Place 1 application rectally 2 (two) times daily as needed for hemorrhoids or itching. 10/27/15  Yes Lennis Marion Downer, MD  HYDROmorphone (DILAUDID) 2 MG tablet 1 tablet every 4-6 hours as needed for pain Patient taking differently: Take 1 mg by mouth every 4 (four) hours as needed for moderate pain.  04/26/16  Yes Lennis Marion Downer, MD  ibuprofen (ADVIL,MOTRIN) 200 MG tablet Take 600 mg by mouth every 8 (eight) hours as needed for fever, headache, mild pain, moderate pain or cramping. Reported on 04/05/2016   Yes Historical Provider, MD  lidocaine-prilocaine (EMLA) cream Apply 1 application topically as needed. Patient taking differently: Apply 1 application topically as needed (for port access).  09/30/15  Yes Lennis Marion Downer, MD  LORazepam (ATIVAN) 1 MG tablet Take 1 tablet (1 mg total) by mouth every 8 (eight) hours. for nausea. 07/02/16  Yes Lennis Marion Downer, MD  Multiple Vitamins-Minerals (MULTI-VITAMIN GUMMIES) CHEW Chew 1 capsule by mouth daily.    Yes Historical Provider, MD  Niraparib Tosylate 100 MG CAPS Take 300 mg by mouth at bedtime. 07/02/16  Yes Lennis Marion Downer, MD  nystatin (MYCOSTATIN/NYSTOP) 100000 UNIT/GM POWD Apply to skin after drying twice a day as directed Patient taking differently: Apply 1 g topically daily. Apply to skin after drying 02/13/16  Yes Lennis Marion Downer, MD  omeprazole (PRILOSEC) 10 MG capsule Take 10 mg by mouth daily as needed (for acid reflex).    Yes Historical Provider, MD  ondansetron (ZOFRAN) 8 MG tablet Take 1 tablet (8 mg total) by mouth every 8 (eight) hours as needed  for nausea or vomiting. 12/08/15  Yes Lennis Marion Downer, MD  polyethylene glycol (MIRALAX / GLYCOLAX) packet Take 17 g by mouth daily.    Yes Historical Provider, MD  PROAIR HFA 108 (90 Base) MCG/ACT inhaler Inhale 1 puff into the lungs 2 (two) times daily as needed for wheezing or shortness of breath.  03/06/16  Yes Historical Provider, MD  prochlorperazine (COMPAZINE) 10 MG tablet Take 1 tablet (10 mg total) by mouth every 6 (six) hours as needed. Patient not taking: Reported on 07/16/2016 02/13/16   Gordy Levan, MD     Allergies:     Allergies  Allergen Reactions  . Codeine Palpitations    Described by patient as "horrible panic attack feeling and shortness of breath"     Physical Exam:   Vitals  Blood pressure 137/94, pulse 100, temperature 97.6 F (36.4 C), temperature source Oral, resp. rate 18, height 5\' 5"  (1.651 m), weight 113.853 kg (251 lb), SpO2 99 %.   General: Young obese female not in distress, pleasant HEENT: No pallor, no icterus, dry mucosa, supple neck Chest: Right-sided Port-A-Cath, clear to auscultation bilaterally, no added sounds CVS: Normal S1 and S2, no murmurs rub or gallop GI: Soft, nondistended, midline laparotomy scar, epigastric and bilateral mid quadrant tenderness, bowel sounds present Musculoskeletal: Warm, no edema CNS: Alert and oriented    Data Review:   Labs at [facility] ED CBC Wbc 7.9, hb :13.9, hct: 41.3, plt: 320  Chemistry Na: 139, k:3.9, cl: 100, co2: 25, bun: 9, cr: 0.9, glucose: 25 AG: 14 Lipase:15 LFTs: normal  UA: few bacteria  CT abdomen and pelvis with contrast peritoneal metastatic disease with hgih grade small bowel obstruction with transition point in the pelvis.increased tumor bulk in RLQ ( complared to last CT)with some ascites   ---------------------------------------------------------------------------------------------------------------  Urinalysis    Component Value Date/Time   PROTEINUR Negative 05/21/2016  1359    ----------------------------------------------------------------------------------------------------------------   Imaging Results:    No results found.  My personal review of EKG: Pending   Assessment & Plan:    Principal Problem:   Partial small bowel obstruction ([place]) Possibly due to lesions from bowel surgery and progressive ovarian malignancy. Clinically she is not showing signs or symptoms of high-grade obstruction as seen on CT scan. Would manage conservative be with bowel rest, nothing by mouth, IV fluids, supportive care with pain medications and antiemetics. Serial abdominal exam. Keep K>4. Follow-up abdominal x-ray in the morning. I have notified GYN oncology (Dr. Delsa Sale) who will see the patient later today. I also called her oncologist Dr. Marko Plume and left a message.  Active Problems:   Epithelial ovarian cancer, FIGO stage IVA (HCC) High-grade serous carcinoma bilateral ovaries status post exploratory laparotomy in September 2016 with hysterectomy, BSO, omentectomy. He has persistent disease after 6 cycles of carbotaxol, switched to [place] and now started on niraparib recently. I have called her oncologist Dr. Marko Plume and left a message. Her chemotherapy is being held due to bowel obstruction symptoms.  Obesity         DVT Prophylaxis Lovenox  AM Labs Ordered, also please review Full Orders  Family Communication: Discussed patient's condition and plan of care in detail with patient, her mother and sister at bedside  Code Status Full code  Likely DC to  [place] called:  GYN onc (  Dr Glennon Mac Laurance Flatten)   Admission status: Inpatient  Time spent in minutes :50   Louellen Molder M.D on 07/16/2016 at 9:53 AM  Between 7am to 7pm - Pager - [phone]. After 7pm go to [email or web address] - password [facility]  [facility] - Office  [phone]

## 2016-07-17 ENCOUNTER — Inpatient Hospital Stay (HOSPITAL_COMMUNITY): Payer: BC Managed Care – PPO

## 2016-07-17 ENCOUNTER — Encounter (HOSPITAL_COMMUNITY): Payer: Self-pay | Admitting: Radiology

## 2016-07-17 LAB — CBC WITH DIFFERENTIAL/PLATELET
BASOS ABS: 0 10*3/uL (ref 0.0–0.1)
BASOS PCT: 0 %
Eosinophils Absolute: 0.1 10*3/uL (ref 0.0–0.7)
Eosinophils Relative: 2 %
HEMATOCRIT: 37.2 % (ref 36.0–46.0)
HEMOGLOBIN: 12.2 g/dL (ref 12.0–15.0)
Lymphocytes Relative: 30 %
Lymphs Abs: 1.5 10*3/uL (ref 0.7–4.0)
MCH: 31.5 pg (ref 26.0–34.0)
MCHC: 32.8 g/dL (ref 30.0–36.0)
MCV: 96.1 fL (ref 78.0–100.0)
MONOS PCT: 9 %
Monocytes Absolute: 0.4 10*3/uL (ref 0.1–1.0)
NEUTROS ABS: 2.8 10*3/uL (ref 1.7–7.7)
NEUTROS PCT: 59 %
Platelets: 294 10*3/uL (ref 150–400)
RBC: 3.87 MIL/uL (ref 3.87–5.11)
RDW: 13.4 % (ref 11.5–15.5)
WBC: 4.8 10*3/uL (ref 4.0–10.5)

## 2016-07-17 LAB — COMPREHENSIVE METABOLIC PANEL
ALK PHOS: 61 U/L (ref 38–126)
ALT: 18 U/L (ref 14–54)
ANION GAP: 7 (ref 5–15)
AST: 25 U/L (ref 15–41)
Albumin: 3.8 g/dL (ref 3.5–5.0)
BUN: 8 mg/dL (ref 6–20)
CALCIUM: 9.1 mg/dL (ref 8.9–10.3)
CHLORIDE: 107 mmol/L (ref 101–111)
CO2: 24 mmol/L (ref 22–32)
Creatinine, Ser: 0.75 mg/dL (ref 0.44–1.00)
GFR calc non Af Amer: >60 mL/min (ref >60)
Glucose, Bld: 76 mg/dL (ref 65–99)
Potassium: 4.3 mmol/L (ref 3.5–5.1)
SODIUM: 138 mmol/L (ref 135–145)
Total Bilirubin: 1.4 mg/dL — ABNORMAL HIGH (ref 0.3–1.2)
Total Protein: 6.7 g/dL (ref 6.5–8.1)

## 2016-07-17 MED ORDER — IOPAMIDOL (ISOVUE-300) INJECTION 61%
100.0000 mL | Freq: Once | INTRAVENOUS | Status: AC | PRN
  Administered 2016-07-17: 100 mL via INTRAVENOUS

## 2016-07-17 MED ORDER — DIATRIZOATE MEGLUMINE & SODIUM 66-10 % PO SOLN
30.0000 mL | Freq: Once | ORAL | Status: AC
  Administered 2016-07-17: 30 mL via ORAL

## 2016-07-17 NOTE — Care Management Note (Signed)
Case Management Note  Patient Details  Name: Sheena Miles MRN: [id number] Date of Birth: 12-08-1986  Subjective/Objective:                  SBO Action/Plan: Discharge planning Expected Discharge Date:                  Expected Discharge Plan:     In-House Referral:     Discharge planning Services     Post Acute Care Choice:    Choice offered to:     DME Arranged:    DME Agency:     HH Arranged:    [place] Agency:     Status of Service:  In process, will continue to follow  If discussed at Long Length of Stay Meetings, dates discussed:    Additional Comments: Utilization Reviewed. Anticipate pt to return home upon discharge with not [place] services.  Pt is ind with ADLs and expect same at discharge.  CM will continue to follow. Dellie Catholic, RN 07/17/2016, 12:08 PM

## 2016-07-17 NOTE — Progress Notes (Addendum)
PROGRESS NOTE                                                                                                                                                                                                             Patient Demographics:    Sheena Miles, is a 30 y.o. female, DOB - 1986-03-27, [id number]  Admit date - 07/16/2016   Admitting Physician Louellen Molder, MD  Outpatient Primary MD for the patient is Rochel Brome, MD  LOS - 1  Outpatient Specialists:  Oncology ( Dr Candace Gallus)   No chief complaint on file.      Brief Narrative   30 y.o. 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 Patient started on a new chemotherapy recently presented to [facility] ED with upper abdominal cramping associated with nausea and vomiting for almost 10 days. CT done there showed high grade SBO with transitioned point in right pelvis.    Subjective:   Still has some  abd discomfort but better. Has 3-4 episodes of vomiting last evening   Assessment  & Plan :   Principal Problem:  Partial small bowel obstruction (HCC) Possibly form adhesions and progressive ovarian malignancy.  conservative management  with bowel rest, nothing by mouth, IV fluids, supportive care with pain medications and antiemetics. Serial abdominal exam. Keep K>4. Follow-up daily abdominal x-ray. Abd xray today shows high grade SBO with ? Intraperitoneal free air. Ordered CT abd and pelvis.  -GYN oncology (Dr. Delsa Sale) and her oncologist Dr. Marko Plume following.  Active Problems:  Epithelial ovarian cancer, FIGO stage IVA (HCC) High-grade serous carcinoma  bilateral ovaries status post exploratory laparotomy in September 2016 with hysterectomy, BSO, omentectomy. He has persistent disease after 6 cycles of carbotaxol, switched to [place] and now started on niraparib recently.  Her chemotherapy is being held due to bowel obstruction .   Obesity    Code Status : full code  Family Communication  : mother at bedside  Disposition Plan  : home once improved  Barriers For Discharge : active symptoms  Consults  :   Oncology ( Dr Marko Plume) Gyn onc  Procedures  :  CT abd with contrast  DVT Prophylaxis  :  Lovenox -   Lab Results  Component Value Date   PLT 294 07/17/2016    Antibiotics  :    Anti-infectives    None        Objective:   Filed Vitals:   07/16/16 0715 07/16/16 1428 07/16/16 2125 07/17/16 0648  BP: 137/94 113/77 135/84 132/78  Pulse: 100 96 70 97  Temp: 97.6 F (36.4 C) 98.8 F (37.1 C) 98.4 F (36.9 C) 98.6 F (37 C)  TempSrc: Oral Oral Oral Oral  Resp: 18 16 16 16   Height: 5\' 5"  (1.651 m)     Weight: 113.853 kg (251 lb)     SpO2: 99% 100% 100% 99%    Wt Readings from Last 3 Encounters:  07/16/16 113.853 kg (251 lb)  07/02/16 118.661 kg (261 lb 9.6 oz)  06/27/16 118.797 kg (261 lb 14.4 oz)     Intake/Output Summary (Last 24 hours) at 07/17/16 1322 Last data filed at 07/17/16 0854  Gross per 24 hour  Intake 2038.33 ml  Output      0 ml  Net 2038.33 ml     Physical Exam  Gen: not in distress HEENT:  moist mucosa, supple neck Chest: clear b/l, no added sounds CVS: N S1&S2, no murmurs, rubs or gallop GI: soft, NT , ND, BS+ Musculoskeletal: warm, no edema     Data Review:    CBC  Recent Labs Lab 07/16/16 1150 07/17/16 0440  WBC 6.2 4.8  HGB 12.5 12.2  HCT 37.9 37.2  PLT 290 294  MCV 95.2 96.1  MCH 31.4 31.5  MCHC 33.0 32.8  RDW 13.3 13.4  LYMPHSABS  --  1.5  MONOABS  --  0.4  EOSABS  --  0.1  BASOSABS  --  0.0    Chemistries   Recent Labs Lab 07/16/16 1150  07/17/16 0440  NA  --  138  K  --  4.3  CL  --  107  CO2  --  24  GLUCOSE  --  76  BUN  --  8  CREATININE 0.85 0.75  CALCIUM  --  9.1  AST  --  25  ALT  --  18  ALKPHOS  --  61  BILITOT  --  1.4*   ------------------------------------------------------------------------------------------------------------------ No results for input(s): CHOL, HDL, LDLCALC, TRIG, CHOLHDL, LDLDIRECT in the last 72 hours.  No results found for: HGBA1C ------------------------------------------------------------------------------------------------------------------ No results for input(s): TSH, T4TOTAL, T3FREE, THYROIDAB in the last 72 hours.  Invalid input(s): FREET3 ------------------------------------------------------------------------------------------------------------------ No results for input(s): VITAMINB12, FOLATE, FERRITIN, TIBC, IRON, RETICCTPCT in the last 72 hours.  Coagulation profile No results for input(s): INR, PROTIME in the last 168 hours.  No results for input(s): DDIMER in the last 72 hours.  Cardiac Enzymes No results for input(s): CKMB, TROPONINI, MYOGLOBIN in the last 168 hours.  Invalid input(s): CK ------------------------------------------------------------------------------------------------------------------ No results found for: BNP  Inpatient Medications  Scheduled Meds: . enoxaparin (LOVENOX) injection  55 mg Subcutaneous Q24H  . famotidine (PEPCID) IV  20 mg Intravenous Q12H  . nystatin  1 g Topical Daily   Continuous Infusions: . 0.9 % NaCl with KCl 20 mEq / L 100 mL/hr (07/17/16 0700)   PRN Meds:.acetaminophen **OR** acetaminophen, albuterol, hydrocortisone, HYDROmorphone (DILAUDID) injection, lidocaine-prilocaine, ondansetron **OR** ondansetron (ZOFRAN) IV, sodium chloride flush  Micro Results No results found for this or any previous visit (from the past 240 hour(s)).  Radiology Reports Dg Abd Portable 1v  07/17/2016  CLINICAL DATA:  Small-bowel  obstruction. EXAM: PORTABLE ABDOMEN - 1 VIEW COMPARISON:  No prior. By history the patient has had a CT yesterday which is unavailable, reference is made to that report. FINDINGS: Dilated loops of proximal small bowel with paucity of the intra colonic air noted consistent small bowel obstruction. Questionable free intraperitoneal air. No pathologic intra-abdominal calcifications. No acute bony abnormality. IMPRESSION: 1. Proximal small bowel obstruction. 2. Cannot exclude subtle free intraperitoneal air. By history the patient has had a prior CT yesterday which is unavailable. Reference is made to that report. A a follow-up IV contrast-enhanced CT can be obtained as needed. Follow-up three-way of the abdomen can be obtained as needed. Critical Value/emergent results were called by telephone at the time of interpretation on 07/17/2016 at 7:02 am to nurse Amy, who verbally acknowledged these results. Electronically Signed   By: Marcello Moores  Register   On: 07/17/2016 07:04    Time Spent in minutes  25   Louellen Molder M.D on 07/17/2016 at 1:22 PM  Between 7am to 7pm - Pager - [phone]  After 7pm go to [email or web address] - password [facility]  [facility] -  Office  [phone]

## 2016-07-17 NOTE — Consult Note (Signed)
CONSULT NOTE  Reason for Consult: Malignant small bowel obstruction Referring Physician: Flonnie Overman Dhungel  Sheena Miles is an 30 y.o. female.  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  Several weeks ago she had a brief, self-limited episode of nausea/vomiting that she attributed to food poisoning.  Several days later nausea/vomiting recurred with associated colicky abdominal pain.  She contacted Medical Oncology and was started on a liquid diet--her symptoms were progressive and she presented to the ED at [facility]. She denies any fevers. Her last BM was Sunday.  She reports a several pound weight loss. A CT scan of the abdomen and pelvis at [facility] showed high-grade small bowel obstruction with transition point in the pelvis with increased tumor bulk in right lower quadrant compared to her last CT.   Since admission to hospital she has had no further emesis however is yet to pass flatus. Minimal abdominal tenderness.  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 First cycle of chemotherapy on 09/26/15. Her CA-125 was trending down --after 6 cycles of chemotherapy it as 88.  A  preop  CA 125 was. 33145, BRCA negative.  She had persistent disease after 6 cycles of paclitaxel and carboplatin with small amount of ascites on imaging as well as slightly elevated CA 125. Chemo continued with 2 cycles of carboplatin taxotere thru 02-27-16. Avastin was added cycle 3,4,5 thru 05-10-16. CA 125 marker from 63 on 04-19-16 to 89 on 05-10-16 and 197  on 05-21-16. Repeat imaging with PET 05-31-16 then CT CAP on 06-14-16 had nonmeasurable disease based on hypermetabolic uptake in paracolic gutters, right adnexa, vaginal cuff. She was started on niraparib on 7/12.  Past Medical History  Diagnosis Date  . Allergy   . Asthma     exercise induced    Past Surgical History  Procedure Laterality Date  . Wisdom tooth extraction  2013    Family History  Problem Relation Age of Onset  . Diabetes Father   . Congestive Heart Failure Father   . Atrial fibrillation Father   . Crohn's disease Maternal Grandmother   . Breast cancer Paternal Grandmother     dx. 19s  . Skin cancer Paternal Uncle     unspecified type; dx. 62 or younger  . Heart attack Paternal Grandfather     Social History:  reports that she has never smoked. She has never used smokeless tobacco. She reports that she drinks alcohol. She reports that she does not use illicit drugs.  Allergies:  Allergies  Allergen Reactions  . Codeine Palpitations    Described by patient as "horrible panic attack feeling and shortness of breath"    Medications: I have reviewed the patient's current medications.  Results for orders placed or performed during the hospital encounter of 07/16/16 (from the past 48 hour(s))  CBC     Status: None   Collection Time: 07/16/16 11:50 AM  Result Value Ref Range   WBC 6.2 4.0 - 10.5 K/uL   RBC 3.98 3.87 - 5.11 MIL/uL   Hemoglobin 12.5 12.0 - 15.0 g/dL   HCT 37.9 36.0 - 46.0 %   MCV 95.2 78.0 - 100.0 fL  MCH 31.4 26.0 - 34.0 pg   MCHC 33.0 30.0 - 36.0 g/dL   RDW 13.3 11.5 - 15.5 %   Platelets 290 150 - 400 K/uL  Creatinine, serum     Status: None   Collection Time: 07/16/16 11:50 AM  Result Value Ref Range   Creatinine, Ser 0.85 0.44 - 1.00 mg/dL   GFR calc non Af Amer >60 >60 mL/min   GFR calc Af Amer >60 >60 mL/min    Comment: (NOTE) The eGFR has been calculated using the CKD EPI equation. This calculation has not been validated in all  clinical situations. eGFR's persistently <60 mL/min signify possible Chronic Kidney Disease.   Comprehensive metabolic panel     Status: Abnormal   Collection Time: 07/17/16  4:40 AM  Result Value Ref Range   Sodium 138 135 - 145 mmol/L   Potassium 4.3 3.5 - 5.1 mmol/L   Chloride 107 101 - 111 mmol/L   CO2 24 22 - 32 mmol/L   Glucose, Bld 76 65 - 99 mg/dL   BUN 8 6 - 20 mg/dL   Creatinine, Ser 0.75 0.44 - 1.00 mg/dL   Calcium 9.1 8.9 - 10.3 mg/dL   Total Protein 6.7 6.5 - 8.1 g/dL   Albumin 3.8 3.5 - 5.0 g/dL   AST 25 15 - 41 U/L   ALT 18 14 - 54 U/L   Alkaline Phosphatase 61 38 - 126 U/L   Total Bilirubin 1.4 (H) 0.3 - 1.2 mg/dL   GFR calc non Af Amer >60 >60 mL/min   GFR calc Af Amer >60 >60 mL/min    Comment: (NOTE) The eGFR has been calculated using the CKD EPI equation. This calculation has not been validated in all clinical situations. eGFR's persistently <60 mL/min signify possible Chronic Kidney Disease.    Anion gap 7 5 - 15  CBC with Differential/Platelet     Status: None   Collection Time: 07/17/16  4:40 AM  Result Value Ref Range   WBC 4.8 4.0 - 10.5 K/uL   RBC 3.87 3.87 - 5.11 MIL/uL   Hemoglobin 12.2 12.0 - 15.0 g/dL   HCT 37.2 36.0 - 46.0 %   MCV 96.1 78.0 - 100.0 fL   MCH 31.5 26.0 - 34.0 pg   MCHC 32.8 30.0 - 36.0 g/dL   RDW 13.4 11.5 - 15.5 %   Platelets 294 150 - 400 K/uL   Neutrophils Relative % 59 %   Neutro Abs 2.8 1.7 - 7.7 K/uL   Lymphocytes Relative 30 %   Lymphs Abs 1.5 0.7 - 4.0 K/uL   Monocytes Relative 9 %   Monocytes Absolute 0.4 0.1 - 1.0 K/uL   Eosinophils Relative 2 %   Eosinophils Absolute 0.1 0.0 - 0.7 K/uL   Basophils Relative 0 %   Basophils Absolute 0.0 0.0 - 0.1 K/uL  .   Review of Systems  Constitutional: Negative for fever.  Gastrointestinal: Positive for nausea, vomiting and abdominal pain. Negative for diarrhea and constipation.   Blood pressure 149/93, pulse 77, temperature 98.1 F (36.7 C), temperature source  Oral, resp. rate 18, height 5' 5"  (1.651 m), weight 251 lb (113.853 kg), SpO2 100 %. Physical Exam  Constitutional: She is oriented to person, place, and time. She appears well-developed and well-nourished.  HENT:  Head: Normocephalic.  Cardiovascular: Normal rate, regular rhythm and normal heart sounds.   Respiratory: Effort normal.  GI: Soft. She exhibits no distension. There is no tenderness. There is no rebound and  no guarding.  Neurological: She is alert and oriented to person, place, and time.    Assessment/Plan: Malignant SBO in the setting of a history of an progressive platinum resistant high grade serous ovarian cancer Symptoms controlled at present with bowel rest/antiemetics/IV hydration.  No indication for surgery in this patient as her obstruction is not focal (albeit concentrated in the RLQ). I explained to the patient the diffuse nature of her carcinomatosis and the multi-focal nature of her disease. Therefore, in this setting, attempted surgical relief of obstruction is neither appropriate or likely to be achievable.   If she has continued emesis, would consider NGT and placement of definitive G tube. However, if she spontaneously resolves this episode, would recommend discharge on full liquid diet. I explained to [facility] that it is unlikely she will return to be able to eating a regular diet in the future.  Discussed therapeutic options for the cancer. Potentially returning to niraparib when she can tolerated PO vs attempting IV cytotoxic. Treatment intent is palliative as this is not a curable disease and anticipated life expectancy is 3-56month.  RDonaciano Eva7/18/2017, 3:48 PM

## 2016-07-18 DIAGNOSIS — R1112 Projectile vomiting: Secondary | ICD-10-CM

## 2016-07-18 DIAGNOSIS — K5669 Other intestinal obstruction: Secondary | ICD-10-CM

## 2016-07-18 DIAGNOSIS — C569 Malignant neoplasm of unspecified ovary: Secondary | ICD-10-CM

## 2016-07-18 MED ORDER — ENOXAPARIN SODIUM 60 MG/0.6ML ~~LOC~~ SOLN
55.0000 mg | SUBCUTANEOUS | Status: DC
  Administered 2016-07-18 – 2016-07-19 (×2): 55 mg via SUBCUTANEOUS
  Filled 2016-07-18 (×2): qty 0.6

## 2016-07-18 MED ORDER — DOCUSATE SODIUM 100 MG PO CAPS
100.0000 mg | ORAL_CAPSULE | Freq: Every day | ORAL | Status: DC
  Filled 2016-07-18 (×2): qty 1

## 2016-07-18 NOTE — Progress Notes (Signed)
Medical Oncology  EMR reviewed, patient seen, discussed with Dr Clementeen Graham on unit and Dr Denman George at bedside. No visitors here presently.  Episodes of vomiting after IV dilaudid. Woke with mid abdominal sharp pain 0200, improved wtth getting up and tylenol. Discussed minimizing narcotics as possible so as not to slow GI motility. No flatus or BM yet. Not thirsty now, not hungry now. Continues bowel rest. VSS, appears comfortable sitting up in bed. PAC site ok Labs noted.  Holding zejula. Appreciate help from all physicains involved.  Please page me if I can assist between my rounds  [phone] L.Marko Plume, MD

## 2016-07-18 NOTE — Progress Notes (Signed)
PROGRESS NOTE                                                                                                                                                                                                             Patient Demographics:    Sheena Miles, is a 30 y.o. female, DOB - Mar 01, 1986, [id number]  Admit date - 07/16/2016   Admitting Physician Louellen Molder, MD  Outpatient Primary MD for the patient is Rochel Brome, MD  LOS - 2  Outpatient Specialists:  Oncology ( Dr Candace Gallus)   No chief complaint on file.      Brief Narrative   30 y.o. 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 Patient started on a new chemotherapy recently presented to [facility] ED with upper abdominal cramping associated with nausea and vomiting for almost 10 days. CT done there showed high grade SBO with transitioned point in right pelvis.    Subjective:   Vomited 3 times yesterday. Did not pass any gas or had bowel movement.   Assessment  & Plan :    Partial small bowel obstruction (HCC) -Possibly form adhesions and progressive ovarian malignancy. -Conservative management  with bowel rest, NPO, IV fluids, supportive care with pain medications and antiemetics.  -Serial abdominal exam. Keep K>4. Follow-up daily abdominal x-ray. -Abd xray today shows high grade SBO with ? Intraperitoneal free air. CT showed ascites and high-grade obstruction. -Continue ambulation and NPO status, hydration.  Epithelial ovarian cancer, FIGO stage IVA (HCC) -High-grade serous carcinoma bilateral ovaries status post exploratory laparotomy in September 2016 with  hysterectomy, BSO, omentectomy.  -He has persistent disease after 6 cycles of carbotaxol, switched to [place] and now started on niraparib recently. -Her chemotherapy is being held due to bowel obstruction .  Obesity -Weight of 251 and BMI of 41.9   Code Status : full code Family Communication  : mother at bedside Disposition Plan  : home once improved Barriers For Discharge : active symptoms Consults  :   Oncology ( Dr Marko Plume) Gyn onc  Procedures  : CT abd with contrast  DVT Prophylaxis  :  Lovenox -   Lab Results  Component Value Date   PLT 294 07/17/2016    Antibiotics  :    Anti-infectives    None        Objective:   Filed Vitals:   07/17/16 0648 07/17/16 1408 07/17/16 2134 07/18/16 0522  BP: 132/78 149/93 126/86 118/75  Pulse: 97 77 86 102  Temp: 98.6 F (37 C) 98.1 F (36.7 C) 97.5 F (36.4 C) 98 F (36.7 C)  TempSrc: Oral Oral Oral Oral  Resp: 16 18 18 18   Height:      Weight:      SpO2: 99% 100% 100% 100%    Wt Readings from Last 3 Encounters:  07/16/16 113.853 kg (251 lb)  07/02/16 118.661 kg (261 lb 9.6 oz)  06/27/16 118.797 kg (261 lb 14.4 oz)     Intake/Output Summary (Last 24 hours) at 07/18/16 1023 Last data filed at 07/18/16 0957  Gross per 24 hour  Intake 1971.67 ml  Output      0 ml  Net 1971.67 ml     Physical Exam  Gen: not in distress HEENT:  moist mucosa, supple neck Chest: clear b/l, no added sounds CVS: N S1&S2, no murmurs, rubs or gallop GI: soft, NT , ND, BS+ Musculoskeletal: warm, no edema     Data Review:    CBC  Recent Labs Lab 07/16/16 1150 07/17/16 0440  WBC 6.2 4.8  HGB 12.5 12.2  HCT 37.9 37.2  PLT 290 294  MCV 95.2 96.1  MCH 31.4 31.5  MCHC 33.0 32.8  RDW 13.3 13.4  LYMPHSABS  --  1.5  MONOABS  --  0.4  EOSABS  --  0.1  BASOSABS  --  0.0    Chemistries   Recent Labs Lab 07/16/16 1150 07/17/16 0440  NA  --  138  K  --  4.3  CL  --  107  CO2  --  24  GLUCOSE  --  76    BUN  --  8  CREATININE 0.85 0.75  CALCIUM  --  9.1  AST  --  25  ALT  --  18  ALKPHOS  --  61  BILITOT  --  1.4*   ------------------------------------------------------------------------------------------------------------------ No results for input(s): CHOL, HDL, LDLCALC, TRIG, CHOLHDL, LDLDIRECT in the last 72 hours.  No results found for: HGBA1C ------------------------------------------------------------------------------------------------------------------ No results for input(s): TSH, T4TOTAL, T3FREE, THYROIDAB in the last 72 hours.  Invalid input(s): FREET3 ------------------------------------------------------------------------------------------------------------------ No results for input(s): VITAMINB12, FOLATE, FERRITIN, TIBC, IRON, RETICCTPCT in the last 72 hours.  Coagulation profile No results for input(s): INR, PROTIME in the last 168 hours.  No results for input(s): DDIMER in the last 72 hours.  Cardiac Enzymes No results for input(s): CKMB, TROPONINI, MYOGLOBIN in the last 168 hours.  Invalid input(s): CK ------------------------------------------------------------------------------------------------------------------ No results found for: BNP  Inpatient Medications  Scheduled Meds: . enoxaparin (LOVENOX) injection  55 mg Subcutaneous Q24H  . famotidine (PEPCID) IV  20 mg Intravenous Q12H  . nystatin  1 g Topical Daily   Continuous Infusions: . 0.9 % NaCl with KCl 20 mEq / L 100 mL/hr at 07/18/16 0240   PRN Meds:.acetaminophen **OR** acetaminophen, albuterol, hydrocortisone, HYDROmorphone (DILAUDID) injection, lidocaine-prilocaine, ondansetron **OR** ondansetron (ZOFRAN) IV, sodium chloride flush  Micro Results No results found for this or any previous visit (from the past 240 hour(s)).  Radiology Reports Ct Abdomen Pelvis W Contrast  07/17/2016  CLINICAL DATA:  Small bowel obstruction. Abdominal pain and vomiting  since 07/09/2016. Ongoing  chemotherapy for FIGO stage IVa ovarian cancer diagnosed in June 2017. EXAM: CT ABDOMEN AND PELVIS WITH CONTRAST TECHNIQUE: Multidetector CT imaging of the abdomen and pelvis was performed using the standard protocol following bolus administration of intravenous contrast. CONTRAST:  165mL ISOVUE-300 IOPAMIDOL (ISOVUE-300) INJECTION 61% COMPARISON:  07/17/2016 radiographs FINDINGS: Lower chest:  Unremarkable Hepatobiliary: No intraparenchymal lesions identified. Dependent density in the gallbladder probably from sludge. Pancreas: Unremarkable Spleen: Unremarkable Adrenals/Urinary Tract: Unremarkable Stomach/Bowel: Anastomotic staple line in the rectum. Progressive dilution of the oral contrast column in dilated loops of small bowel, point of transition difficult to be certain of but probably in the pelvis. Vascular/Lymphatic: Scattered upper normal sized mesenteric lymph nodes but no retroperitoneal adenopathy. Reproductive: Uterus and ovaries absent. Other: Perihepatic mild perisplenic ascites with ascites tracking in the paracolic gutters and in the anterior pelvis. Tumor nodularity is observed along this ascites,, notably in the right paracolic gutter ; this is fairly similar to the prior exam. There is some mild nodularity along the left paracolic gutter as well. Mesenteric nodularity in the pelvis adjacent to the pelvic ascites. Possible nodularity in the vicinity of the distal ileum. Musculoskeletal: Unremarkable IMPRESSION: 1. Ascites with tumor nodularity along the paracolic gutters and potentially in the pelvis, ascites slightly increased from yesterday's exam but otherwise the appearance is stable. 2. Persistent low-grade small bowel obstruction, transition point along the matted loops of pelvic bowel and in the vicinity of the pelvic ascites. Electronically Signed   By: Van Clines M.D.   On: 07/17/2016 18:26   Dg Abd Portable 1v  07/17/2016  CLINICAL DATA:  Small-bowel obstruction. EXAM:  PORTABLE ABDOMEN - 1 VIEW COMPARISON:  No prior. By history the patient has had a CT yesterday which is unavailable, reference is made to that report. FINDINGS: Dilated loops of proximal small bowel with paucity of the intra colonic air noted consistent small bowel obstruction. Questionable free intraperitoneal air. No pathologic intra-abdominal calcifications. No acute bony abnormality. IMPRESSION: 1. Proximal small bowel obstruction. 2. Cannot exclude subtle free intraperitoneal air. By history the patient has had a prior CT yesterday which is unavailable. Reference is made to that report. A a follow-up IV contrast-enhanced CT can be obtained as needed. Follow-up three-way of the abdomen can be obtained as needed. Critical Value/emergent results were called by telephone at the time of interpretation on 07/17/2016 at 7:02 am to nurse Amy, who verbally acknowledged these results. Electronically Signed   By: Marcello Moores  Register   On: 07/17/2016 07:04    Time Spent in minutes  25   Costas Sena A M.D on 07/18/2016 at 10:23 AM  Between 7am to 7pm - Pager - [phone]  After 7pm go to [email or web address] - password [facility]  [facility] -  Office  [phone]

## 2016-07-19 LAB — BASIC METABOLIC PANEL
ANION GAP: 10 (ref 5–15)
BUN: 5 mg/dL — AB (ref 6–20)
CALCIUM: 9.4 mg/dL (ref 8.9–10.3)
CO2: 20 mmol/L — ABNORMAL LOW (ref 22–32)
Chloride: 106 mmol/L (ref 101–111)
Creatinine, Ser: 0.7 mg/dL (ref 0.44–1.00)
GFR calc Af Amer: >60 mL/min (ref >60)
Glucose, Bld: 83 mg/dL (ref 65–99)
Potassium: 4.1 mmol/L (ref 3.5–5.1)
Sodium: 136 mmol/L (ref 135–145)

## 2016-07-19 LAB — CBC
HCT: 38 % (ref 36.0–46.0)
HEMOGLOBIN: 12.7 g/dL (ref 12.0–15.0)
MCH: 31.4 pg (ref 26.0–34.0)
MCHC: 33.4 g/dL (ref 30.0–36.0)
MCV: 93.8 fL (ref 78.0–100.0)
PLATELETS: 286 10*3/uL (ref 150–400)
RBC: 4.05 MIL/uL (ref 3.87–5.11)
RDW: 13 % (ref 11.5–15.5)
WBC: 5.3 10*3/uL (ref 4.0–10.5)

## 2016-07-19 MED ORDER — LORAZEPAM 1 MG PO TABS
1.0000 mg | ORAL_TABLET | ORAL | Status: DC | PRN
  Administered 2016-07-19: 1 mg via ORAL
  Filled 2016-07-19: qty 1

## 2016-07-19 NOTE — Progress Notes (Signed)
PROGRESS NOTE                                                                                                                                                                                                             Patient Demographics:    Sheena Miles, is a 30 y.o. female, DOB - 03-22-1986, [id number]  Admit date - 07/16/2016   Admitting Physician Louellen Molder, MD  Outpatient Primary MD for the patient is Rochel Brome, MD  LOS - 3  Outpatient Specialists:  Oncology ( Dr Candace Gallus)   No chief complaint on file.      Brief Narrative   30 y.o. 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 Patient started on a new chemotherapy recently presented to [facility] ED with upper abdominal cramping associated with nausea and vomiting for almost 10 days. CT done there showed high grade SBO with transitioned point in right pelvis.    Subjective:   No vomiting overnight, had 3 bowel movements yesterday. We'll as diet to full liquids.   Assessment  & Plan :    Partial small bowel obstruction (HCC) -Possibly form adhesions and progressive ovarian malignancy. -Conservative management  with bowel rest, NPO, IV fluids, supportive care with pain medications and antiemetics.  -Serial abdominal exam. Keep K>4. Follow-up daily abdominal x-ray. -Abd xray today shows high grade SBO with ? Intraperitoneal free air. CT showed ascites and high-grade obstruction. -Had 2 bowel movements yesterday was on clear liquids for the afternoon and she tolerated. -Continue ambulation, advanced her diet to full liquids.  Epithelial ovarian cancer, FIGO  stage IVA (HCC) -High-grade serous carcinoma bilateral ovaries status post exploratory laparotomy in September 2016 with hysterectomy, BSO, omentectomy.  -He has persistent disease after 6 cycles of carbotaxol, switched to [place] and now started on niraparib recently. -Her chemotherapy is being held due to bowel obstruction .  Obesity -Weight of 251 and BMI of 41.9   Code Status : full code Family Communication  : mother at bedside Disposition Plan  : home once improved Barriers For Discharge : active symptoms Consults  :   Oncology (  Dr Marko Plume) Gyn onc  Procedures  : CT abd with contrast  DVT Prophylaxis  :  Lovenox -   Lab Results  Component Value Date   PLT 286 07/19/2016    Antibiotics  :    Anti-infectives    None        Objective:   Filed Vitals:   07/18/16 0522 07/18/16 1332 07/18/16 2210 07/19/16 0507  BP: 118/75 125/79 129/86 105/67  Pulse: 102 85 85 92  Temp: 98 F (36.7 C) 97.1 F (36.2 C) 98.6 F (37 C) 98.9 F (37.2 C)  TempSrc: Oral Oral Oral Oral  Resp: 18 18 18 18   Height:      Weight:      SpO2: 100% 100% 99% 98%    Wt Readings from Last 3 Encounters:  07/16/16 113.853 kg (251 lb)  07/02/16 118.661 kg (261 lb 9.6 oz)  06/27/16 118.797 kg (261 lb 14.4 oz)     Intake/Output Summary (Last 24 hours) at 07/19/16 1232 Last data filed at 07/19/16 1000  Gross per 24 hour  Intake    940 ml  Output      0 ml  Net    940 ml     Physical Exam  Gen: not in distress HEENT:  moist mucosa, supple neck Chest: clear b/l, no added sounds CVS: N S1&S2, no murmurs, rubs or gallop GI: soft, NT , ND, BS+ Musculoskeletal: warm, no edema     Data Review:    CBC  Recent Labs Lab 07/16/16 1150 07/17/16 0440 07/19/16 0415  WBC 6.2 4.8 5.3  HGB 12.5 12.2 12.7  HCT 37.9 37.2 38.0  PLT 290 294 286  MCV 95.2 96.1 93.8  MCH 31.4 31.5 31.4  MCHC 33.0 32.8 33.4  RDW 13.3 13.4 13.0  LYMPHSABS  --  1.5  --   MONOABS  --  0.4  --     EOSABS  --  0.1  --   BASOSABS  --  0.0  --     Chemistries   Recent Labs Lab 07/16/16 1150 07/17/16 0440 07/19/16 0415  NA  --  138 136  K  --  4.3 4.1  CL  --  107 106  CO2  --  24 20*  GLUCOSE  --  76 83  BUN  --  8 5*  CREATININE 0.85 0.75 0.70  CALCIUM  --  9.1 9.4  AST  --  25  --   ALT  --  18  --   ALKPHOS  --  61  --   BILITOT  --  1.4*  --    ------------------------------------------------------------------------------------------------------------------ No results for input(s): CHOL, HDL, LDLCALC, TRIG, CHOLHDL, LDLDIRECT in the last 72 hours.  No results found for: HGBA1C ------------------------------------------------------------------------------------------------------------------ No results for input(s): TSH, T4TOTAL, T3FREE, THYROIDAB in the last 72 hours.  Invalid input(s): FREET3 ------------------------------------------------------------------------------------------------------------------ No results for input(s): VITAMINB12, FOLATE, FERRITIN, TIBC, IRON, RETICCTPCT in the last 72 hours.  Coagulation profile No results for input(s): INR, PROTIME in the last 168 hours.  No results for input(s): DDIMER in the last 72 hours.  Cardiac Enzymes No results for input(s): CKMB, TROPONINI, MYOGLOBIN in the last 168 hours.  Invalid input(s): CK ------------------------------------------------------------------------------------------------------------------ No results found for: BNP  Inpatient Medications  Scheduled Meds: . docusate sodium  100 mg Oral Daily  . enoxaparin (LOVENOX) injection  55 mg Subcutaneous Q24H  . famotidine (PEPCID) IV  20 mg Intravenous Q12H  . nystatin  1 g Topical Daily  Continuous Infusions: . 0.9 % NaCl with KCl 20 mEq / L 100 mL/hr at 07/19/16 0553   PRN Meds:.acetaminophen **OR** acetaminophen, albuterol, hydrocortisone, HYDROmorphone (DILAUDID) injection, lidocaine-prilocaine, ondansetron **OR** ondansetron  (ZOFRAN) IV, sodium chloride flush  Micro Results No results found for this or any previous visit (from the past 240 hour(s)).  Radiology Reports Ct Abdomen Pelvis W Contrast  07/17/2016  CLINICAL DATA:  Small bowel obstruction. Abdominal pain and vomiting since 07/09/2016. Ongoing chemotherapy for FIGO stage IVa ovarian cancer diagnosed in June 2017. EXAM: CT ABDOMEN AND PELVIS WITH CONTRAST TECHNIQUE: Multidetector CT imaging of the abdomen and pelvis was performed using the standard protocol following bolus administration of intravenous contrast. CONTRAST:  133mL ISOVUE-300 IOPAMIDOL (ISOVUE-300) INJECTION 61% COMPARISON:  07/17/2016 radiographs FINDINGS: Lower chest:  Unremarkable Hepatobiliary: No intraparenchymal lesions identified. Dependent density in the gallbladder probably from sludge. Pancreas: Unremarkable Spleen: Unremarkable Adrenals/Urinary Tract: Unremarkable Stomach/Bowel: Anastomotic staple line in the rectum. Progressive dilution of the oral contrast column in dilated loops of small bowel, point of transition difficult to be certain of but probably in the pelvis. Vascular/Lymphatic: Scattered upper normal sized mesenteric lymph nodes but no retroperitoneal adenopathy. Reproductive: Uterus and ovaries absent. Other: Perihepatic mild perisplenic ascites with ascites tracking in the paracolic gutters and in the anterior pelvis. Tumor nodularity is observed along this ascites,, notably in the right paracolic gutter ; this is fairly similar to the prior exam. There is some mild nodularity along the left paracolic gutter as well. Mesenteric nodularity in the pelvis adjacent to the pelvic ascites. Possible nodularity in the vicinity of the distal ileum. Musculoskeletal: Unremarkable IMPRESSION: 1. Ascites with tumor nodularity along the paracolic gutters and potentially in the pelvis, ascites slightly increased from yesterday's exam but otherwise the appearance is stable. 2. Persistent low-grade  small bowel obstruction, transition point along the matted loops of pelvic bowel and in the vicinity of the pelvic ascites. Electronically Signed   By: Van Clines M.D.   On: 07/17/2016 18:26   Dg Abd Portable 1v  07/17/2016  CLINICAL DATA:  Small-bowel obstruction. EXAM: PORTABLE ABDOMEN - 1 VIEW COMPARISON:  No prior. By history the patient has had a CT yesterday which is unavailable, reference is made to that report. FINDINGS: Dilated loops of proximal small bowel with paucity of the intra colonic air noted consistent small bowel obstruction. Questionable free intraperitoneal air. No pathologic intra-abdominal calcifications. No acute bony abnormality. IMPRESSION: 1. Proximal small bowel obstruction. 2. Cannot exclude subtle free intraperitoneal air. By history the patient has had a prior CT yesterday which is unavailable. Reference is made to that report. A a follow-up IV contrast-enhanced CT can be obtained as needed. Follow-up three-way of the abdomen can be obtained as needed. Critical Value/emergent results were called by telephone at the time of interpretation on 07/17/2016 at 7:02 am to nurse Amy, who verbally acknowledged these results. Electronically Signed   By: Marcello Moores  Register   On: 07/17/2016 07:04    Time Spent in minutes  25   Mckenzee Beem A M.D on 07/19/2016 at 12:32 PM  Between 7am to 7pm - Pager - [phone]  After 7pm go to [email or web address] - password [facility]  [facility] -  Office  [phone]

## 2016-07-20 ENCOUNTER — Other Ambulatory Visit: Payer: Self-pay | Admitting: Oncology

## 2016-07-20 ENCOUNTER — Telehealth: Payer: Self-pay | Admitting: Gynecologic Oncology

## 2016-07-20 DIAGNOSIS — C569 Malignant neoplasm of unspecified ovary: Secondary | ICD-10-CM

## 2016-07-20 LAB — BASIC METABOLIC PANEL
ANION GAP: 9 (ref 5–15)
BUN: 5 mg/dL — ABNORMAL LOW (ref 6–20)
CO2: 24 mmol/L (ref 22–32)
Calcium: 9.2 mg/dL (ref 8.9–10.3)
Chloride: 105 mmol/L (ref 101–111)
Creatinine, Ser: 0.6 mg/dL (ref 0.44–1.00)
GLUCOSE: 94 mg/dL (ref 65–99)
POTASSIUM: 4 mmol/L (ref 3.5–5.1)
Sodium: 138 mmol/L (ref 135–145)

## 2016-07-20 MED ORDER — HEPARIN SOD (PORK) LOCK FLUSH 100 UNIT/ML IV SOLN
500.0000 [IU] | INTRAVENOUS | Status: AC | PRN
  Administered 2016-07-20: 500 [IU]

## 2016-07-20 NOTE — Telephone Encounter (Signed)
Called and informed patient of the below per Dr. Marko Plume.  Stating she is doing well at home since discharge.    Could you please let patient know by phone later today or Mon that gyn onc has other recommendations if things do not continue to improve. She should hold niraparib this weekend and should let us know how she is on Monday.   I will add prealbumin to my labs/ visit next week. I asked schedulers to put her on my schedule Thurs 7-27 (not showing in EMR yet). I moved labs to 7-27 with visit.   Lennis   Advised patient to call for any questions or concerns.

## 2016-07-20 NOTE — Discharge Summary (Signed)
Physician Discharge Summary  Royann Goth [id number] DOB: [birth year] DOA: 07/16/2016  PCP: Rochel Brome, MD  Admit date: 07/16/2016 Discharge date: 07/20/2016  Admitted From:Home Disposition:  Home  Recommendations for Outpatient Follow-up:  1. Follow up with primary oncologist in 1-2 weeks 2. Please obtain BMP/CBC in one week  Home Health: None Equipment/Devices: N/A  Discharge Condition: Stable CODE STATUS: Full code Diet recommendation: Full liquids can try soft diet  Brief/Interim Summary: 30 y.o. 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 Patient started on a new chemotherapy recently presented to [facility] ED with upper abdominal cramping associated with nausea and vomiting for almost 10 days. CT done there showed high grade SBO with transitioned point in right pelvis  Discharge Diagnoses:  Principal Problem:   Partial small bowel obstruction (HCC) Active Problems:   Epithelial ovarian cancer, FIGO stage IVA (HCC)   Nausea and vomiting   Partial small bowel obstruction (HCC) -Possibly form adhesions and progressive ovarian malignancy. -Conservative management with bowel rest, NPO, IV fluids, supportive care with pain medications and antiemetics.  -Serial abdominal exam. Keep K>4. Follow-up daily abdominal x-ray. -Abd xray today shows high grade SBO with ? Intraperitoneal free air. CT showed ascites and high-grade obstruction. -Had 2 bowel movements, diet advance to clear liquids then she continues to have bowel movement so started on full liquids. -She had full liquid breakfast this morning and after that she had bowel movement complaints. -Discharged home on full liquid  diet, follow-up with primary oncologist as soon as possible.  Epithelial ovarian cancer, FIGO stage IVA (HCC) -High-grade serous carcinoma bilateral ovaries status post exploratory laparotomy in September 2016 with hysterectomy, BSO, omentectomy.  -He has persistent disease after 6 cycles of carbotaxol, switched to [place] and now started on niraparib recently. -Her chemotherapy is being held due to bowel obstruction. -Family and patient aware about the poor prognosis. -Follow with primary oncologist for further recommendations.  Obesity -Weight of 251 and BMI of 41.9  Discharge Instructions  Discharge Instructions    Diet - low sodium heart healthy    Complete by:  As directed      Increase activity slowly    Complete by:  As directed             Medication List    TAKE these medications        ferrous fumarate 325 (106 Fe) MG Tabs tablet  Commonly known as:  HEMOCYTE - 106 mg FE  Take 1 tablet daily on an empty stomach with OJ.     gabapentin 100 MG capsule  Commonly known as:  NEURONTIN  Take 1 capsule (100 mg total) by mouth at bedtime.     hydrocortisone 2.5 % rectal cream  Commonly known as:  ANUSOL-HC  Place 1 application rectally 2 (two) times daily as needed for hemorrhoids or itching.     HYDROmorphone 2 MG tablet  Commonly known as:  DILAUDID  1 tablet every 4-6 hours as needed for pain     ibuprofen 200 MG tablet  Commonly known as:  ADVIL,MOTRIN  Take 600 mg by mouth every 8 (eight) hours as needed for fever, headache, mild pain, moderate pain or cramping. Reported on 04/05/2016     lidocaine-prilocaine cream  Commonly known as:  EMLA  Apply 1 application topically as needed.     LORazepam 1 MG tablet  Commonly known as:  ATIVAN  Take 1 tablet (1 mg total) by mouth every 8 (eight) hours. for nausea.     MULTI-VITAMIN GUMMIES Chew  Chew 1 capsule by mouth daily.     Niraparib Tosylate 100 MG Caps  Take 300 mg by mouth at bedtime.      nystatin powder  Generic drug:  nystatin  Apply to skin after drying twice a day as directed     omeprazole 10 MG capsule  Commonly known as:  PRILOSEC  Take 10 mg by mouth daily as needed (for acid reflex).     ondansetron 8 MG tablet  Commonly known as:  ZOFRAN  Take 1 tablet (8 mg total) by mouth every 8 (eight) hours as needed for nausea or vomiting.     polyethylene glycol packet  Commonly known as:  MIRALAX / GLYCOLAX  Take 17 g by mouth daily.     PROAIR HFA 108 (90 Base) MCG/ACT inhaler  Generic drug:  albuterol  Inhale 1 puff into the lungs 2 (two) times daily as needed for wheezing or shortness of breath.     prochlorperazine 10 MG tablet  Commonly known as:  COMPAZINE  Take 1 tablet (10 mg total) by mouth every 6 (six) hours as needed.        Allergies  Allergen Reactions  . Codeine Palpitations    Described by patient as "horrible panic attack feeling and shortness of breath"    Consultations:  Dr. Marko Plume oncology and Dr. Denman George gyn/oncology   Procedures/Studies: Ct Abdomen Pelvis W Contrast  07/17/2016  CLINICAL DATA:  Small bowel obstruction. Abdominal pain and vomiting since 07/09/2016. Ongoing chemotherapy for FIGO stage IVa ovarian cancer diagnosed in June 2017. EXAM: CT ABDOMEN AND PELVIS WITH CONTRAST TECHNIQUE: Multidetector CT imaging of the abdomen and pelvis was performed using the standard protocol following bolus administration of intravenous contrast. CONTRAST:  145mL ISOVUE-300 IOPAMIDOL (ISOVUE-300) INJECTION 61% COMPARISON:  07/17/2016 radiographs FINDINGS: Lower chest:  Unremarkable Hepatobiliary: No intraparenchymal lesions identified. Dependent density in the gallbladder probably from sludge. Pancreas: Unremarkable Spleen: Unremarkable Adrenals/Urinary Tract: Unremarkable Stomach/Bowel: Anastomotic staple line in the rectum. Progressive dilution of the oral contrast column in dilated loops of small bowel, point of transition difficult to be  certain of but probably in the pelvis. Vascular/Lymphatic: Scattered upper normal sized mesenteric lymph nodes but no retroperitoneal adenopathy. Reproductive: Uterus and ovaries absent. Other: Perihepatic mild perisplenic ascites with ascites tracking in the paracolic gutters and in the anterior pelvis. Tumor nodularity is observed along this ascites,, notably in the right paracolic gutter ; this is fairly similar to the prior exam. There is some mild nodularity along the left paracolic gutter as well. Mesenteric nodularity in the pelvis adjacent to the pelvic ascites. Possible nodularity in the vicinity of the distal ileum. Musculoskeletal: Unremarkable IMPRESSION: 1. Ascites with tumor nodularity along the paracolic gutters and potentially in the pelvis, ascites slightly increased from yesterday's exam but otherwise the appearance is stable. 2. Persistent low-grade small bowel obstruction, transition point along the matted loops of pelvic bowel and in the vicinity of the pelvic ascites. Electronically Signed   By: Van Clines M.D.   On: 07/17/2016 18:26   Dg Abd Portable 1v  07/17/2016  CLINICAL DATA:  Small-bowel obstruction. EXAM: PORTABLE ABDOMEN - 1 VIEW COMPARISON:  No prior. By history the patient has had a CT yesterday which is unavailable, reference  is made to that report. FINDINGS: Dilated loops of proximal small bowel with paucity of the intra colonic air noted consistent small bowel obstruction. Questionable free intraperitoneal air. No pathologic intra-abdominal calcifications. No acute bony abnormality. IMPRESSION: 1. Proximal small bowel obstruction. 2. Cannot exclude subtle free intraperitoneal air. By history the patient has had a prior CT yesterday which is unavailable. Reference is made to that report. A a follow-up IV contrast-enhanced CT can be obtained as needed. Follow-up three-way of the abdomen can be obtained as needed. Critical Value/emergent results were called by telephone at  the time of interpretation on 07/17/2016 at 7:02 am to nurse Amy, who verbally acknowledged these results. Electronically Signed   By: Marcello Moores  Register   On: 07/17/2016 07:04    (Echo, Carotid, EGD, Colonoscopy, ERCP)    Subjective:   Discharge Exam: Filed Vitals:   07/19/16 2115 07/20/16 0615  BP: 120/72 127/78  Pulse: 101 98  Temp: 99.3 F (37.4 C) 99.6 F (37.6 C)  Resp: 18 18   Filed Vitals:   07/19/16 1531 07/19/16 1610 07/19/16 2115 07/20/16 0615  BP:  153/108 120/72 127/78  Pulse: 102  101 98  Temp: 98.7 F (37.1 C)  99.3 F (37.4 C) 99.6 F (37.6 C)  TempSrc: Oral  Oral Oral  Resp: 18  18 18   Height:      Weight:      SpO2: 100%  99% 99%    General: Pt is alert, awake, not in acute distress Cardiovascular: RRR, S1/S2 +, no rubs, no gallops Respiratory: CTA bilaterally, no wheezing, no rhonchi Abdominal: Soft, NT, ND, bowel sounds + Extremities: no edema, no cyanosis    The results of significant diagnostics from this hospitalization (including imaging, microbiology, ancillary and laboratory) are listed below for reference.     Microbiology: No results found for this or any previous visit (from the past 240 hour(s)).   Labs: BNP (last 3 results) No results for input(s): BNP in the last 8760 hours. Basic Metabolic Panel:  Recent Labs Lab 07/16/16 1150 07/17/16 0440 07/19/16 0415 07/20/16 0415  NA  --  138 136 138  K  --  4.3 4.1 4.0  CL  --  107 106 105  CO2  --  24 20* 24  GLUCOSE  --  76 83 94  BUN  --  8 5* 5*  CREATININE 0.85 0.75 0.70 0.60  CALCIUM  --  9.1 9.4 9.2   Liver Function Tests:  Recent Labs Lab 07/17/16 0440  AST 25  ALT 18  ALKPHOS 61  BILITOT 1.4*  PROT 6.7  ALBUMIN 3.8   No results for input(s): LIPASE, AMYLASE in the last 168 hours. No results for input(s): AMMONIA in the last 168 hours. CBC:  Recent Labs Lab 07/16/16 1150 07/17/16 0440 07/19/16 0415  WBC 6.2 4.8 5.3  NEUTROABS  --  2.8  --   HGB 12.5  12.2 12.7  HCT 37.9 37.2 38.0  MCV 95.2 96.1 93.8  PLT 290 294 286   Cardiac Enzymes: No results for input(s): CKTOTAL, CKMB, CKMBINDEX, TROPONINI in the last 168 hours. BNP: Invalid input(s): POCBNP CBG: No results for input(s): GLUCAP in the last 168 hours. D-Dimer No results for input(s): DDIMER in the last 72 hours. Hgb A1c No results for input(s): HGBA1C in the last 72 hours. Lipid Profile No results for input(s): CHOL, HDL, LDLCALC, TRIG, CHOLHDL, LDLDIRECT in the last 72 hours. Thyroid function studies No results for input(s): TSH, T4TOTAL, T3FREE, THYROIDAB  in the last 72 hours.  Invalid input(s): FREET3 Anemia work up No results for input(s): VITAMINB12, FOLATE, FERRITIN, TIBC, IRON, RETICCTPCT in the last 72 hours. Urinalysis    Component Value Date/Time   PROTEINUR Negative 05/21/2016 1359   Sepsis Labs Invalid input(s): PROCALCITONIN,  WBC,  LACTICIDVEN Microbiology No results found for this or any previous visit (from the past 240 hour(s)).   Time coordinating discharge: Over 30 minutes  SIGNED:   Birdie Hopes, MD  [facility] 07/20/2016, 9:30 AM Pager   If 7PM-7AM, please contact night-coverage [email or web address] Password [email or web address]

## 2016-07-23 ENCOUNTER — Telehealth: Payer: Self-pay | Admitting: Oncology

## 2016-07-23 ENCOUNTER — Other Ambulatory Visit: Payer: BC Managed Care – PPO

## 2016-07-23 ENCOUNTER — Telehealth: Payer: Self-pay

## 2016-07-23 ENCOUNTER — Other Ambulatory Visit: Payer: Self-pay | Admitting: Oncology

## 2016-07-23 DIAGNOSIS — C569 Malignant neoplasm of unspecified ovary: Secondary | ICD-10-CM

## 2016-07-23 NOTE — Telephone Encounter (Signed)
lvm to inform pt of 7/27 appts per LL pof

## 2016-07-23 NOTE — Telephone Encounter (Signed)
Sheena Miles called to follow up on her condition post  discharge from hospital 07-20-16  for SBO. She did well Friday then slept all day Saturday and did not have much fluid intake.  She took her temp and it was 102.5 T-max. She did have chills.  Took tylenol and temp came down. Today it is 97.6. She vomited yesterday.  Last evening she vomited some fecal matter.  Told her that Dr. Marko Plume said to not restart the [place]. Only clear liquids. Continue with nausea medications. Dr. Marko Plume spoke with Sheena Miles with Gyn ONC and is trying to arrange an appointment directly at [facility] with Sheena Miles for further evaluation. Sheena Miles in agreement with this plan.

## 2016-07-23 NOTE — Telephone Encounter (Signed)
appt made avs printed °

## 2016-07-26 ENCOUNTER — Other Ambulatory Visit: Payer: BC Managed Care – PPO

## 2016-07-26 ENCOUNTER — Ambulatory Visit: Payer: BC Managed Care – PPO

## 2016-07-26 ENCOUNTER — Inpatient Hospital Stay: Payer: BC Managed Care – PPO | Admitting: Oncology

## 2016-07-26 ENCOUNTER — Other Ambulatory Visit: Payer: Self-pay | Admitting: Oncology

## 2016-07-28 ENCOUNTER — Encounter: Payer: Self-pay | Admitting: Oncology

## 2016-07-28 ENCOUNTER — Other Ambulatory Visit: Payer: Self-pay | Admitting: Oncology

## 2016-07-28 NOTE — Progress Notes (Signed)
Medical Oncology  Patient hospitalized at [facility]. POF sent to cancel lab and MD on 07-30-16  L.Marko Plume MD

## 2016-07-30 ENCOUNTER — Ambulatory Visit: Payer: BC Managed Care – PPO | Admitting: Oncology

## 2016-07-30 ENCOUNTER — Other Ambulatory Visit: Payer: BC Managed Care – PPO

## 2016-08-06 ENCOUNTER — Other Ambulatory Visit: Payer: BC Managed Care – PPO

## 2016-08-09 ENCOUNTER — Telehealth: Payer: Self-pay | Admitting: Oncology

## 2016-08-09 ENCOUNTER — Other Ambulatory Visit: Payer: Self-pay | Admitting: Oncology

## 2016-08-09 NOTE — Telephone Encounter (Signed)
lvm to inform pt of 8/24 appt per LL

## 2016-08-16 ENCOUNTER — Inpatient Hospital Stay: Payer: BC Managed Care – PPO | Admitting: Oncology

## 2016-08-16 ENCOUNTER — Other Ambulatory Visit: Payer: BC Managed Care – PPO

## 2016-08-22 ENCOUNTER — Other Ambulatory Visit: Payer: Self-pay | Admitting: Oncology

## 2016-08-22 ENCOUNTER — Ambulatory Visit: Payer: BC Managed Care – PPO | Admitting: Gynecologic Oncology

## 2016-08-23 ENCOUNTER — Ambulatory Visit (HOSPITAL_BASED_OUTPATIENT_CLINIC_OR_DEPARTMENT_OTHER): Payer: BC Managed Care – PPO | Admitting: Oncology

## 2016-08-23 ENCOUNTER — Encounter: Payer: Self-pay | Admitting: Oncology

## 2016-08-23 ENCOUNTER — Ambulatory Visit (HOSPITAL_BASED_OUTPATIENT_CLINIC_OR_DEPARTMENT_OTHER): Payer: BC Managed Care – PPO

## 2016-08-23 ENCOUNTER — Telehealth: Payer: Self-pay | Admitting: Oncology

## 2016-08-23 ENCOUNTER — Other Ambulatory Visit (HOSPITAL_BASED_OUTPATIENT_CLINIC_OR_DEPARTMENT_OTHER): Payer: BC Managed Care – PPO

## 2016-08-23 VITALS — BP 122/89 | HR 120 | Temp 98.5°F | Resp 18 | Ht 65.0 in | Wt 242.3 lb

## 2016-08-23 DIAGNOSIS — Z452 Encounter for adjustment and management of vascular access device: Secondary | ICD-10-CM

## 2016-08-23 DIAGNOSIS — C562 Malignant neoplasm of left ovary: Secondary | ICD-10-CM

## 2016-08-23 DIAGNOSIS — D701 Agranulocytosis secondary to cancer chemotherapy: Secondary | ICD-10-CM

## 2016-08-23 DIAGNOSIS — C561 Malignant neoplasm of right ovary: Secondary | ICD-10-CM | POA: Diagnosis not present

## 2016-08-23 DIAGNOSIS — R634 Abnormal weight loss: Secondary | ICD-10-CM

## 2016-08-23 DIAGNOSIS — C569 Malignant neoplasm of unspecified ovary: Secondary | ICD-10-CM

## 2016-08-23 DIAGNOSIS — Z95828 Presence of other vascular implants and grafts: Secondary | ICD-10-CM

## 2016-08-23 DIAGNOSIS — R180 Malignant ascites: Secondary | ICD-10-CM | POA: Diagnosis not present

## 2016-08-23 DIAGNOSIS — D500 Iron deficiency anemia secondary to blood loss (chronic): Secondary | ICD-10-CM

## 2016-08-23 LAB — CBC WITH DIFFERENTIAL/PLATELET
BASO%: 0.6 % (ref 0.0–2.0)
Basophils Absolute: 0 10*3/uL (ref 0.0–0.1)
EOS%: 0.7 % (ref 0.0–7.0)
Eosinophils Absolute: 0 10*3/uL (ref 0.0–0.5)
HEMATOCRIT: 27.3 % — AB (ref 34.8–46.6)
HGB: 8.9 g/dL — ABNORMAL LOW (ref 11.6–15.9)
LYMPH#: 1.8 10*3/uL (ref 0.9–3.3)
LYMPH%: 26.6 % (ref 14.0–49.7)
MCH: 29 pg (ref 25.1–34.0)
MCHC: 32.8 g/dL (ref 31.5–36.0)
MCV: 88.6 fL (ref 79.5–101.0)
MONO#: 0.8 10*3/uL (ref 0.1–0.9)
MONO%: 11.9 % (ref 0.0–14.0)
NEUT#: 4.1 10*3/uL (ref 1.5–6.5)
NEUT%: 60.2 % (ref 38.4–76.8)
Platelets: 470 10*3/uL — ABNORMAL HIGH (ref 145–400)
RBC: 3.08 10*6/uL — AB (ref 3.70–5.45)
RDW: 14.2 % (ref 11.2–14.5)
WBC: 6.8 10*3/uL (ref 3.9–10.3)

## 2016-08-23 LAB — COMPREHENSIVE METABOLIC PANEL
ALT: 12 U/L (ref 0–55)
AST: 16 U/L (ref 5–34)
Albumin: 2.4 g/dL — ABNORMAL LOW (ref 3.5–5.0)
Alkaline Phosphatase: 125 U/L (ref 40–150)
Anion Gap: 10 mEq/L (ref 3–11)
BUN: 13.4 mg/dL (ref 7.0–26.0)
CALCIUM: 9.6 mg/dL (ref 8.4–10.4)
CHLORIDE: 103 meq/L (ref 98–109)
CO2: 26 meq/L (ref 22–29)
CREATININE: 0.6 mg/dL (ref 0.6–1.1)
EGFR: >90 mL/min/{1.73_m2} (ref >90)
Glucose: 101 mg/dl (ref 70–140)
Potassium: 3.3 mEq/L — ABNORMAL LOW (ref 3.5–5.1)
Sodium: 139 mEq/L (ref 136–145)
Total Bilirubin: 0.3 mg/dL (ref 0.20–1.20)
Total Protein: 6.9 g/dL (ref 6.4–8.3)

## 2016-08-23 MED ORDER — CYCLOBENZAPRINE HCL 10 MG PO TABS: 5.0000 mg | ORAL_TABLET | Freq: Three times a day (TID) | ORAL | 0 refills | Status: DC | PRN

## 2016-08-23 MED ORDER — HEPARIN SOD (PORK) LOCK FLUSH 100 UNIT/ML IV SOLN
500.0000 [IU] | Freq: Once | INTRAVENOUS | Status: AC
  Administered 2016-08-23: 500 [IU] via INTRAVENOUS
  Filled 2016-08-23: qty 5

## 2016-08-23 MED ORDER — SODIUM CHLORIDE 0.9% FLUSH
10.0000 mL | INTRAVENOUS | Status: DC | PRN
  Administered 2016-08-23: 10 mL via INTRAVENOUS
  Filled 2016-08-23: qty 10

## 2016-08-23 MED ORDER — IBUPROFEN 600 MG PO TABS: 600.0000 mg | ORAL_TABLET | Freq: Four times a day (QID) | ORAL | 0 refills | Status: AC | PRN

## 2016-08-23 MED ORDER — OXYCODONE HCL ER 10 MG PO T12A: 10.0000 mg | EXTENDED_RELEASE_TABLET | Freq: Two times a day (BID) | ORAL | 0 refills | Status: DC

## 2016-08-23 NOTE — Progress Notes (Signed)
OFFICE PROGRESS NOTE   August 23, 2016   Physicians:  [name], [name], MD (PCP [place]) , [name]  [facility] records received and reviewed for visit, not yet all in [place].   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 She has open wound. Plan is doxil with addition of avastin when adequately healed. She saw Dr Alycia Rossetti at [facility] on 08-20-16. She has been seen twice additionally at [facility] for the surgical wound, which will now be followed by gyn onc NP / MD in [place].   Patient was hospitalized at [facility] with bowel obstruction 7-17 thru 07-20-16, with initial improvement. Symptoms reoccurred shortly after DC home and she was admitted to [facility] 7-24 thru 08-06-16. Intraoperative findings 07-29-16 were of dilated small bowel, with distal ileum, ascending and descending colon coated with white rind, and mesentery of small bowel shortened and matted, rectum and sigmoid not visualized due to rind and matted terminal ileum in pelvis. Good diameter bowel lumen noted after bypass between ileum and transverse colon.  Patient had TNA initially, then advanced diet in hospital. She continues prophylactic lovenox injections since surgery. She had only just begun niraparib around time of the bowel obstruction, that not continued.   Patient is tolerating regular diet well, with regular bowel movements more than once daily not diarrhea, no nausea or vomiting. Per mother, the surgical wound is improving, that packed and dressed twice daily. Patient is uncomfortable in surgical area to the point that she is taking oxycodone 5 mg every 4 hours in addition to ibuprofen 600 mg every 6 hrs, without complete relief. She has had no fever, does have hot flashes as before. Bladder is ok,  no bleeding, no SOB or cough. She is not sleeping well from general discomfort. No problems with PAC. Voiding easily. She is hoping to work half days at [employer], has been working this week, students return next week. Remainder of 10 point Review of Systems negative.   Power PAC placed at [facility] 09-09-15 Genetics testing 10-27-15 normal ([facility] panel). 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 She was seen at [facility] with concern for acute appendicitis. She was evaluated at local ED, I believe [facility], with CT reportedly showing large left and small right pleural effusions, moderate ascites, complex septated cystic mass in low pelvis into left abdomen 13 x 7 x 7 cm, adenopathy Including large diaphragmatic lymph nodes anterior to heart and numerous mesenteric nodes, and apparent carcinomatosis with implants in omentum up to 3.8 cm. She was seen by Dr [name] and referred to Dr Alycia Rossetti. At Dr Elenora Gamma exam 08-31-15 there was large mass in cul de sac and abdominal fluid wave, decreased BS left lower 1/2 of chest. Lab studies included CA 125 33,145; inhibin B <10; CEA <0.5, AFP 2.2, LDH 1281 and HCG negative. Surgery by Dr Alycia Rossetti at [facility] on 09-06-15 was exploratory laparotomy with supracervical hysterectomy, BSO, omentectomy, resection with primary reanastomosis of rectosigmoid and peritoneal stripping. Intraoperative findings included 3 liters of bloody  ascites, 12 cm omental mass adherent to uterus, sigmoid colon and rectum which was resected en bloc, bladder nodule resected. At completion of surgery there was residual tumor along entirety of small bowel mesentery with multiple nodules up to 1.5  cm, miliary disease on diaphragm, and peritoneal nodularity along pelvis and rectum (R2 resection). 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 No nodes submitted.  She was transfused 2 units PRBCs on each 9-9 and 09-13-15 for hemoglobin as low as 6.8 - 7.1. She had urgent right thoracentesis for 750 cc on 09-08-15 with cytology positive for high grade serous carcinoma ([id number]) and left thoracentesis for 400 cc on 09-09-15. CT angio chest on POD 1 was negative for PE. She tolerated morphine by PCA post operatively. She had mild cellulitis at abdominal incision on day of DC, begun on Keflex then. Psychiatry saw in hospital, begun on zoloft. She was discharged home with 28 days of lovenox. She saw Dr Alycia Rossetti on 09-19-15 with staples removed, however wound opened that evening. She was seen by gyn onc provider on 09-23-15, with wound open 3 x 5 cm, 3 cm deep, no evidence of infection. Wet to dry dressings bid were begun, ongoing. She had follow up wound check on 09-27-15, no findings of concern. GOG protocol at [facility] had delay, so that treatment given off protocol with first cycle of carboplatin taxol at [facility] on 09-26-15. She had additional 5 cycles of carboplatin taxol at [facility] thru 12-19-15. Restaging CT CAP showed no diffuse peritoneal disease remaining, small ascites and small right pleural effusion, thickened retrocecal appendix, right external iliac node 0.7 cm. Chemo continued with 2 cycles of carboplatin taxotere thru 02-27-16. Avastin was added cycle 3,4,5 thru 05-10-16. CA 125 marker from 63 on 04-19-16 to 89 on 05-10-16 and 197 on 05-21-16. Repeat imaging with PET 05-31-16 then CT CAP on 06-14-16 had nonmeasurable disease based on hypermetabolic uptake in paracolic gutters, right adnexa, vaginal cuff. With nonmeasurable disease she was not eligible for [id number] Nivolumab/ Ipilimumab  study and likewise not eligible for another study at [facility]. She meets FDA eligibility for niraparib, now planned as next intervention. CA 125 807 on 07-02-16 as baseline for niraparib.  .  Objective:  Vital signs in last 24 hours:  BP 122/89 (BP Location: Right Arm, Patient Position: Sitting)   Pulse (!) 120   Temp 98.5 F (36.9 C) (Oral)   Resp 18   Ht 5' 5"  (1.651 m)   Wt 242 lb 4.8 oz (109.9 kg)   SpO2 98%   BMI 40.32 kg/m  Weight down from 261 in late June.  Alert, oriented and appropriate, smiling and very pleasant as always. Looks mildly uncomfortable supine on exam table, slightly diaphoretic.  Respirations not labored. Ambulatory  Hair is growing back.  HEENT:PERRL, sclerae not icteric. Oral mucosa moist without lesions, posterior pharynx clear.  Neck supple. No JVD.  Lymphatics:no supraclavicular adenopathy Resp: clear to auscultation bilaterally and normal percussion bilaterally Cardio: regular rate and rhythm. No gallop. GI: soft, some bowel sounds, not distended. Midline incision with superficial area open 0.7 cm inferiiorly and open area ~ 4x4 x4 cm with tracking superiorly to right, good granulation tissue, clean. No surrounding erythema.  Musculoskeletal/ Extremities: without pitting edema, cords, tenderness Neuro: no peripheral neuropathy. Otherwise nonfocal Skin without rash, ecchymosis, petechiae Portacath-without erythema or tenderness  Wound evaluated also by gyn onc NP  Lab Results:  Results for orders placed or performed in visit on 08/23/16  CBC with Differential  Result Value Ref Range   WBC 6.8 3.9 - 10.3 10e3/uL   NEUT# 4.1 1.5 - 6.5 10e3/uL   HGB 8.9 (L) 11.6 - 15.9 g/dL   HCT 27.3 (L) 34.8 - 46.6 %   Platelets 470 (H) 145 - 400 10e3/uL   MCV 88.6 79.5 - 101.0 fL   MCH 29.0 25.1 - 34.0 pg   MCHC 32.8 31.5 - 36.0 g/dL   RBC 3.08 (L) 3.70 - 5.45 10e6/uL   RDW 14.2 11.2 - 14.5 %   lymph# 1.8 0.9 - 3.3 10e3/uL   MONO# 0.8 0.1 - 0.9 10e3/uL    Eosinophils Absolute 0.0 0.0 - 0.5 10e3/uL   Basophils Absolute 0.0 0.0 - 0.1 10e3/uL   NEUT% 60.2 38.4 - 76.8 %   LYMPH% 26.6 14.0 - 49.7 %   MONO% 11.9 0.0 - 14.0 %   EOS% 0.7 0.0 - 7.0 %   BASO% 0.6 0.0 - 2.0 %  Comprehensive metabolic panel  Result Value Ref Range   Sodium 139 136 - 145 mEq/L   Potassium 3.3 (L) 3.5 - 5.1 mEq/L   Chloride 103 98 - 109 mEq/L   CO2 26 22 - 29 mEq/L   Glucose 101 70 - 140 mg/dl   BUN 13.4 7.0 - 26.0 mg/dL   Creatinine 0.6 0.6 - 1.1 mg/dL   Total Bilirubin 0.30 0.20 - 1.20 mg/dL   Alkaline Phosphatase 125 40 - 150 U/L   AST 16 5 - 34 U/L   ALT 12 0 - 55 U/L   Total Protein 6.9 6.4 - 8.3 g/dL   Albumin 2.4 (L) 3.5 - 5.0 g/dL   Calcium 9.6 8.4 - 10.4 mg/dL   Anion Gap 10 3 - 11 mEq/L   EGFR >90 >90 ml/min/1.73 m2     Studies/Results:  CTA Chest Pe8/03/2016 [facility] Result Impression   No evidence of pulmonary emboli.  Trace left pleural effusion.  Please see concurrently obtained CT of the abdomen and pelvis for discussion of the upper abdominal findings.  Result Narrative  EXAM: CTA Chest for Pulmonary Embolus DATE: 08/03/2016 2:10 PM ACCESSION: [id number] UN DICTATED: 08/03/2016 2:15 PM INTERPRETATION LOCATION: [place]  CLINICAL INDICATION: 30 years old Female with fever--  COMPARISON: Chest radiograph dated 08/01/2016. CTA chest dated 09/07/2015. CT abdomen and pelvis dated 08/03/2016.  TECHNIQUE: A spiral CTA scan was obtained with IV contrast from the thoracic inlet through the hemidiaphragms. Images were reconstructed in the axial plane. Multiplanar reformatted and MIP images are provided for further evaluation of the pulmonary vasculature.  FINDINGS:  AIRWAYS, LUNGS, PLEURA:  Clear central airways.  Azygos lobe noted. No consolidation. No nodules.   Trace left pleural effusion. No right pleural effusion. No pneumothorax.  PULMONARY ARTERIES: No filling defects identified in the pulmonary arteries.  MEDIASTINUM:    Right chest port catheter tip terminates in the SVC. Enteric tube terminates in the gastric antrum.  Normal heart size. No pericardial effusion.   Normal caliber thoracic aorta.   No lymphadenopathy.  IMAGED ABDOMEN: Small-volume free fluid and free air in the upper abdomen, better evaluated on complete CT abdomen and pelvis.  SOFT TISSUES: Unremarkable.  BONES: Unremarkable.     2017 August CT Abdomen Pelvis W Contrast8/03/2016 [facility] Result Impression  - No evidence of bowel obstruction. Bowel wall thickening and hyperenhancement of multiple loops of ileum is most likely due to inflammation/peritonitis and enteritis.  -  Multiloculated ascites, interdigitated within multiple loops of bowel, with peritoneal enhancement. This is most likely due to peritonitis.  Result Narrative  EXAM: CT abdomen and pelvis with contrast DATE: 08/03/2016 2:10 PM ACCESSION: [id number] UN DICTATED: 08/03/2016 2:21 PM INTERPRETATION LOCATION: [place]: 30 years old Female with postop, SBO  COMPARISON: None  TECHNIQUE: A spiral CT scan was obtained with IV contrast from the lung bases to the pubic symphysis.Images were reconstructed in the axial plane. Coronal and sagittal reformatted images were also provided for further evaluation.  FINDINGS:  LINES/DEVICES: None.  LOWER CHEST: Unremarkable.  ABDOMEN/PELVIS:  HEPATOBILIARY: Unremarkable liver. No biliary ductal dilatation.  GALLBLADDER: Unremarkable. PANCREAS: Unremarkable. SPLEEN: Unremarkable. ADRENAL GLANDS: Unremarkable.  KIDNEYS/URETERS: Unremarkable. BLADDER: Unremarkable.  BOWEL/PERITONEUM/RETROPERITONEUM: Postsurgical changes following ileotransverse colon bypass. Oral contrast extends from the proximal small bowel to the rectum, without evidence of obstruction. There is bowel wall thickening of multiple loops of ileum in the pelvis. Multiloculated fluid, with interdigitations throughout the  mesentery and peritoneal enhancement.Fluid is loculated in the anterior pelvis, with surrounding enhancement. Trace pneumoperitoneum is likely postsurgical, with locules anterior to the liver and along the anterior midline abdomen.  REPRODUCTIVE ORGANS: Uterus is surgically absent. Ovaries not identified, may be atrophic versus absent.  VASCULATURE: Abdominal aorta within normal limits. Portal, superior mesenteric, and splenic veins are patent.  LYMPH NODES: Mesenteric nodes are likely reactive.  BONES/SOFT TISSUES: Postsurgical changes along the midline abdomen.    Echocardiogram 06-15-16  EF  65%    Medications: I have reviewed the patient's current medications. Will add oxycontin 10 mg q 12 hrs and she can increase oxycodone to 10 mg q 4-6 hrs if needed.  Ibuprofen refilled, instructed to take with food and to stay well hydrated with the NSAID She prefers to increase K in diet rather than oral supplement now  DISCUSSION Interval history reviewed.  Medications as above Discussed good nutrition to hasten surgical healing: she is drinking one protein shake daily, but will try to increase to 2x daily Patient is in agreement with recommendation to proceed with doxil, then likely to add avastin after she has healed from surgery. Note she had avastin thru 05-10-16, with  carbo taxotere.  Doxil teaching done by RN in addition to discussion with MD now, with specific instruction on skin care. She will not need steroids with doxil, which will help wound healing, and hopefully counts will tolerate this well. She prefers treatments on Thursdays Dr Alycia Rossetti suggested every 2 week Avastin when that is added. Patient has given verbal consent for the chemotherapy.  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 Malignant pleural fluid and ascites at presentation. Persistent disease  after 6 cycles carbo taxol, changed to [place] taxotere due to neuropathy, avastin added beginning cycle 3 03-26-16; Carbo taxotere avastin continued thru cycle 5 chemo on 05-10-16. Restaging done due to increasing CA 125, with hypermetabolic uptake by PET but no measurable disease on CT such that she was not eligible for nivolumab trial or another [facility] trial. Briefly started on niraparib, then bowel obstruction. She is post exploratory lap with ileotransverse colon bypass of bowel obstruction due to progressive malignancy. Plan doxil every 4 weeks to begin 08-30-16. Will try to add avastin ~ q 2 weeks after healing from recent surgery Genetics testing negative for germline BRCA mutation.  2.open surgical wound: excellent care by mother, healing. Gyn onc following. 3.peripheral neuropathy essentially resolved  off of taxane. Gabapentin helpful previously.  4.power PAC 5.chemo neutropenia with carbo taxane previously. 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 7. Hemoglobin lower since surgery: resume oral iron and follow. Not symptomatic enough to transfuse now. 8.weight loss 19 lbs since June with bowel obstruction and surgery. Will ask [place] nutritionist to see coordinating with other appointment.  9.Surgical menopause with hot flashes 10. Has declined information on advance directives   All questions answered and she knows to call if concerns between scheduled visits.Chemo orders placed, preauthorization requested.  TIme spent 40 min including >50% counseling and coordination of care. Route PCP, cc Dr Alycia Rossetti  Evlyn Clines, MD   08/23/2016, 9:26 PM

## 2016-08-23 NOTE — Patient Instructions (Signed)

## 2016-08-23 NOTE — Telephone Encounter (Signed)
appt made and avs printed °

## 2016-08-24 ENCOUNTER — Other Ambulatory Visit: Payer: Self-pay | Admitting: Oncology

## 2016-08-24 ENCOUNTER — Telehealth: Payer: Self-pay

## 2016-08-24 ENCOUNTER — Telehealth: Payer: Self-pay | Admitting: Gynecologic Oncology

## 2016-08-24 ENCOUNTER — Ambulatory Visit (HOSPITAL_BASED_OUTPATIENT_CLINIC_OR_DEPARTMENT_OTHER): Payer: BC Managed Care – PPO

## 2016-08-24 DIAGNOSIS — C562 Malignant neoplasm of left ovary: Secondary | ICD-10-CM

## 2016-08-24 DIAGNOSIS — R509 Fever, unspecified: Secondary | ICD-10-CM

## 2016-08-24 DIAGNOSIS — C561 Malignant neoplasm of right ovary: Secondary | ICD-10-CM | POA: Diagnosis not present

## 2016-08-24 DIAGNOSIS — R634 Abnormal weight loss: Secondary | ICD-10-CM | POA: Insufficient documentation

## 2016-08-24 DIAGNOSIS — D500 Iron deficiency anemia secondary to blood loss (chronic): Secondary | ICD-10-CM | POA: Insufficient documentation

## 2016-08-24 LAB — URINALYSIS, MICROSCOPIC - CHCC
BILIRUBIN (URINE): NEGATIVE
Blood: NEGATIVE
Glucose: NEGATIVE mg/dL
KETONES: NEGATIVE mg/dL
Leukocyte Esterase: NEGATIVE
Nitrite: NEGATIVE
Protein: <30 mg/dL
Specific Gravity, Urine: 1.02 (ref 1.003–1.035)
Urobilinogen, UR: 0.2 mg/dL (ref 0.2–1)
pH: 6 (ref 4.6–8.0)

## 2016-08-24 LAB — PREALBUMIN: PREALBUMIN: 14 mg/dL (ref 14–35)

## 2016-08-24 NOTE — Telephone Encounter (Signed)
Told Ms Tomanek the results of the U/A as noted below by Dr. Marko Plume. Ms Spinuzzi verbalized understanding.

## 2016-08-24 NOTE — Telephone Encounter (Signed)
Sheena Marion Downer, MD  Baruch Merl, RN        [place] - UA does not show obvious infection. She should call [facility] if shaking chills or temp >=101 this weekend. Would not begin empiric antibiotics. Would be best if she drinks more fluids, which you probably already told her.

## 2016-08-24 NOTE — Telephone Encounter (Signed)
Patient called reporting low grade temp.  States last pm, her temperature was 100.2 and today it is 100.7.  She reports having chills and feeling achy.  Her mother states she left a message with Dr. Marko Plume but they were concerned about the weekend coming up with these symptoms.  Advised that I would talk with Dr. Mariana Kaufman RN and also forward this message to Dr. Marko Plume.  No other concerns voiced.

## 2016-08-26 LAB — URINE CULTURE: Organism ID, Bacteria: NO GROWTH

## 2016-08-27 ENCOUNTER — Telehealth: Payer: Self-pay

## 2016-08-27 ENCOUNTER — Ambulatory Visit: Payer: BC Managed Care – PPO

## 2016-08-27 ENCOUNTER — Telehealth: Payer: Self-pay | Admitting: Oncology

## 2016-08-27 NOTE — Telephone Encounter (Signed)
Spoke with Ms Lye this am to let her know that the urine culture was negative from 08-24-16 and to see how she was doing. Ms Allegra stated that her temp went up to 102.1 Saturday 08-25-16 and she called [facility] and they direct admitted her. She was hoping to go home today but had not heard about her from the physicians. Dr. Marko Plume notified of admission.

## 2016-08-27 NOTE — Telephone Encounter (Signed)
Spoke with pt to confirm nutrition appt on 9/11 per pt request per LOS

## 2016-08-27 NOTE — Telephone Encounter (Signed)
-----   Message from Gordy Levan, MD sent at 08/26/2016  1:17 PM EDT ----- Labs seen and need follow up: on Mon 8-28 please let her know urine cx negative and ask if fever over w/e        thanks

## 2016-08-30 ENCOUNTER — Telehealth: Payer: Self-pay

## 2016-08-30 ENCOUNTER — Other Ambulatory Visit: Payer: BC Managed Care – PPO

## 2016-08-30 ENCOUNTER — Ambulatory Visit: Payer: BC Managed Care – PPO

## 2016-08-30 NOTE — Telephone Encounter (Signed)
S/w pt and she was released from hospital [facility] yesterday. Dr Alycia Rossetti told her that [facility] is supposed to move her lab/chemo appt to next Thurs 9/7 and Dr Arie Sabina s/w Dr Marko Plume about this. No change in appts noted in chart yet.

## 2016-08-31 ENCOUNTER — Telehealth: Payer: Self-pay | Admitting: Oncology

## 2016-08-31 ENCOUNTER — Telehealth: Payer: Self-pay

## 2016-08-31 NOTE — Telephone Encounter (Signed)
vm full and unable to lvm to inform pt of r/s appts to 9/8 at 2 pm per LOS

## 2016-08-31 NOTE — Telephone Encounter (Signed)
Pt called re: no message left on phone. Explained her VM is full. Gave her appt information.

## 2016-09-04 ENCOUNTER — Encounter: Payer: Self-pay | Admitting: Oncology

## 2016-09-04 ENCOUNTER — Other Ambulatory Visit: Payer: Self-pay | Admitting: Oncology

## 2016-09-04 DIAGNOSIS — C569 Malignant neoplasm of unspecified ovary: Secondary | ICD-10-CM

## 2016-09-04 NOTE — Progress Notes (Signed)
Medical Oncology  Email from Dr Alycia Rossetti that abdominal culture was enteric flora with no speciation. 09-06-16 will be day 10 of drain and should be finished antibiotics. Dr Alycia Rossetti would be in favor of moving ahead with treatment then if not febrile and drainage not frankly purulent due to fair amount of disease likely progressing. Drain interrogation will be 9-13 if need to wait until after that. WIll open spot on my schedule to see her on 9-7, leave chemo on 9-8 for now  L.Marko Plume, MD

## 2016-09-06 ENCOUNTER — Other Ambulatory Visit (HOSPITAL_BASED_OUTPATIENT_CLINIC_OR_DEPARTMENT_OTHER): Payer: BC Managed Care – PPO

## 2016-09-06 ENCOUNTER — Ambulatory Visit (HOSPITAL_BASED_OUTPATIENT_CLINIC_OR_DEPARTMENT_OTHER): Payer: BC Managed Care – PPO | Admitting: Oncology

## 2016-09-06 ENCOUNTER — Ambulatory Visit (HOSPITAL_BASED_OUTPATIENT_CLINIC_OR_DEPARTMENT_OTHER): Payer: BC Managed Care – PPO

## 2016-09-06 ENCOUNTER — Other Ambulatory Visit: Payer: Self-pay | Admitting: *Deleted

## 2016-09-06 ENCOUNTER — Encounter: Payer: Self-pay | Admitting: Oncology

## 2016-09-06 VITALS — BP 130/94 | HR 120 | Temp 98.6°F | Resp 18 | Ht 65.0 in | Wt 235.1 lb

## 2016-09-06 DIAGNOSIS — N739 Female pelvic inflammatory disease, unspecified: Secondary | ICD-10-CM

## 2016-09-06 DIAGNOSIS — J910 Malignant pleural effusion: Secondary | ICD-10-CM | POA: Diagnosis not present

## 2016-09-06 DIAGNOSIS — Z95828 Presence of other vascular implants and grafts: Secondary | ICD-10-CM

## 2016-09-06 DIAGNOSIS — D6481 Anemia due to antineoplastic chemotherapy: Secondary | ICD-10-CM

## 2016-09-06 DIAGNOSIS — C562 Malignant neoplasm of left ovary: Secondary | ICD-10-CM

## 2016-09-06 DIAGNOSIS — C561 Malignant neoplasm of right ovary: Secondary | ICD-10-CM | POA: Diagnosis not present

## 2016-09-06 DIAGNOSIS — R180 Malignant ascites: Secondary | ICD-10-CM | POA: Diagnosis not present

## 2016-09-06 DIAGNOSIS — Z452 Encounter for adjustment and management of vascular access device: Secondary | ICD-10-CM | POA: Diagnosis not present

## 2016-09-06 DIAGNOSIS — C569 Malignant neoplasm of unspecified ovary: Secondary | ICD-10-CM

## 2016-09-06 DIAGNOSIS — D701 Agranulocytosis secondary to cancer chemotherapy: Secondary | ICD-10-CM

## 2016-09-06 DIAGNOSIS — T451X5A Adverse effect of antineoplastic and immunosuppressive drugs, initial encounter: Secondary | ICD-10-CM

## 2016-09-06 DIAGNOSIS — Z8719 Personal history of other diseases of the digestive system: Secondary | ICD-10-CM

## 2016-09-06 LAB — CBC WITH DIFFERENTIAL/PLATELET
BASO%: 0.2 % (ref 0.0–2.0)
BASOS ABS: 0 10*3/uL (ref 0.0–0.1)
EOS%: 1.4 % (ref 0.0–7.0)
Eosinophils Absolute: 0.1 10*3/uL (ref 0.0–0.5)
HEMATOCRIT: 28.4 % — AB (ref 34.8–46.6)
HGB: 8.8 g/dL — ABNORMAL LOW (ref 11.6–15.9)
LYMPH#: 1.9 10*3/uL (ref 0.9–3.3)
LYMPH%: 21.6 % (ref 14.0–49.7)
MCH: 27.7 pg (ref 25.1–34.0)
MCHC: 31 g/dL — AB (ref 31.5–36.0)
MCV: 89.3 fL (ref 79.5–101.0)
MONO#: 0.7 10*3/uL (ref 0.1–0.9)
MONO%: 8 % (ref 0.0–14.0)
NEUT#: 6 10*3/uL (ref 1.5–6.5)
NEUT%: 68.8 % (ref 38.4–76.8)
PLATELETS: 417 10*3/uL — AB (ref 145–400)
RBC: 3.18 10*6/uL — ABNORMAL LOW (ref 3.70–5.45)
RDW: 16.2 % — ABNORMAL HIGH (ref 11.2–14.5)
WBC: 8.7 10*3/uL (ref 3.9–10.3)

## 2016-09-06 LAB — COMPREHENSIVE METABOLIC PANEL
ALT: <9 U/L (ref 0–55)
ANION GAP: 12 meq/L — AB (ref 3–11)
AST: 17 U/L (ref 5–34)
Albumin: 2.7 g/dL — ABNORMAL LOW (ref 3.5–5.0)
Alkaline Phosphatase: 88 U/L (ref 40–150)
BUN: 12.9 mg/dL (ref 7.0–26.0)
CALCIUM: 9.5 mg/dL (ref 8.4–10.4)
CHLORIDE: 101 meq/L (ref 98–109)
CO2: 25 mEq/L (ref 22–29)
Creatinine: 0.7 mg/dL (ref 0.6–1.1)
EGFR: >90 mL/min/{1.73_m2} (ref >90)
Glucose: 95 mg/dl (ref 70–140)
POTASSIUM: 3.7 meq/L (ref 3.5–5.1)
Sodium: 138 mEq/L (ref 136–145)
Total Bilirubin: <0.3 mg/dL (ref 0.20–1.20)
Total Protein: 7.2 g/dL (ref 6.4–8.3)

## 2016-09-06 MED ORDER — HEPARIN SOD (PORK) LOCK FLUSH 100 UNIT/ML IV SOLN
500.0000 [IU] | Freq: Once | INTRAVENOUS | Status: AC
  Administered 2016-09-06: 500 [IU] via INTRAVENOUS
  Filled 2016-09-06: qty 5

## 2016-09-06 MED ORDER — SODIUM CHLORIDE 0.9% FLUSH
10.0000 mL | INTRAVENOUS | Status: DC | PRN
  Administered 2016-09-06: 10 mL via INTRAVENOUS
  Filled 2016-09-06: qty 10

## 2016-09-06 NOTE — Patient Instructions (Signed)

## 2016-09-06 NOTE — Progress Notes (Signed)
OFFICE PROGRESS NOTE   September 06, 2016   Physicians: [name], [name], MD (PCP [place]) , [name]  Communication with Dr Alycia Rossetti prior to this visit. She is concerned about progression of disease, prefers proceeding with chemo if possible.  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 She had ileotransverse colonic bypass at [facility] 07-29-16 with extensive disease present, and hospitalization 8-26 thru 08-29-16 at [facility] for intraabdominal abscess. She has open surgical wound and JP drain.    She was hospitalized at [facility] 08-25-16 thru 08-29-16, with temperature 102 and chills, with intraabdominal abscess/ fluid collection by CT. She had IR drainage, culture with mixed enteric flora. She has JP drain has ~ 40 cc q AM and 30 cc q PM reportedly less purulent; she has follow up with [facility] for reimaging 09-12-16. She continues flagyl and levaquin. She has had no fever since hospitalization. Open wound from laparotomy 07-29-16 improving per patient report, not requiring as much packing superiorly and not as deep.  Bowels are loose or watery, several times daily since most recent hospitalization. Taste bad from flagyl, is still eating and drinking fluids. Not much discomfort from drain or surgical wound. Bladder ok. No increased SOB, no SOB walking in office now. No bleeding. No problems with PAC, which was used in hospital. Energy ok to teach half days now. Remainder of 10 point Review of Systems negative.   Power PAC placed at [facility] 09-09-15 Genetics testing 10-27-15 normal ([facility] panel). 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 She was seen at [facility] with concern for acute appendicitis. She was evaluated at local ED, I believe [facility], with CT reportedly showing large left and small right pleural effusions, moderate ascites, complex septated cystic mass in low pelvis into left abdomen 13 x 7 x 7 cm, adenopathy Including large diaphragmatic lymph nodes anterior to heart and numerous mesenteric nodes, and apparent carcinomatosis with implants in omentum up to 3.8 cm. She was seen by Dr [name] and referred to Dr Alycia Rossetti. At Dr Elenora Gamma exam 08-31-15 there was large mass in cul de sac and abdominal fluid wave, decreased BS left lower 1/2 of chest. Lab studies included CA 125 33,145; inhibin B <10; CEA <0.5, AFP 2.2, LDH 1281 and HCG negative. Surgery by Dr Alycia Rossetti at [facility] on 09-06-15 was exploratory laparotomy with supracervical hysterectomy, BSO, omentectomy, resection with primary reanastomosis of rectosigmoid and peritoneal stripping. Intraoperative findings included 3 liters of bloody ascites, 12 cm omental mass adherent to uterus, sigmoid colon and rectum which was resected en bloc, bladder nodule resected. At completion of surgery there was residual tumor along entirety of small bowel mesentery with multiple nodules up to 1.5 cm, miliary disease on diaphragm, and peritoneal nodularity along pelvis and rectum (R2 resection). 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 No nodes submitted.  She was transfused 2 units PRBCs on each 9-9 and 09-13-15 for hemoglobin as low as 6.8 - 7.1. She had  urgent right thoracentesis for 750 cc on 09-08-15 with cytology positive for high grade serous carcinoma ([id number]) and left thoracentesis for 400 cc on 09-09-15. CT angio chest on POD 1 was negative for PE. She  tolerated morphine by PCA post operatively. She had mild cellulitis at abdominal incision on day of DC, begun on Keflex then. Psychiatry saw in hospital, begun on zoloft. She was discharged home with 28 days of lovenox. She saw Dr Alycia Rossetti on 09-19-15 with staples removed, however wound opened that evening. She was seen by gyn onc provider on 09-23-15, with wound open 3 x 5 cm, 3 cm deep, no evidence of infection. Wet to dry dressings bid were begun, ongoing. She had follow up wound check on 09-27-15, no findings of concern. GOG protocol at [facility] had delay, so that treatment given off protocol with first cycle of carboplatin taxol at [facility] on 09-26-15. She had additional 5 cycles of carboplatin taxol at [facility] thru 12-19-15. Restaging CT CAP showed no diffuse peritoneal disease remaining, small ascites and small right pleural effusion, thickened retrocecal appendix, right external iliac node 0.7 cm. Chemo continued with 2 cycles of carboplatin taxotere thru 02-27-16. Avastin was added cycle 3,4,5 thru 05-10-16. CA 125 marker from 63 on 04-19-16 to 89 on 05-10-16 and 197 on 05-21-16. Repeat imaging with PET 05-31-16 then CT CAP on 06-14-16 had nonmeasurable disease based on hypermetabolic uptake in paracolic gutters, right adnexa, vaginal cuff. With nonmeasurable disease she was not eligible for [id number] Nivolumab/ Ipilimumab study and likewise not eligible for another study at [facility].  CA 125 807 on 07-02-16. 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 She was readmitted to [facility] 8-26 to 08-29-16 with fever, abdominal abscess / fluid collection drained, cultures mixed enteric organisms     Objective:  Vital signs in last 24 hours:  BP (!) 130/94  (BP Location: Left Arm, Patient Position: Sitting)   Pulse (!) 120   Temp 98.6 F (37 C) (Oral)   Resp 18   Ht 5' 5"  (1.651 m)   Wt 235 lb 1.6 oz (106.6 kg)   SpO2 100%   BMI 39.12 kg/m  Weight down 7 lbs Alert, oriented and appropriate. Ambulatory without assistance, wears abdominal binder.  Hair is growing back  HEENT:PERRL, sclerae not icteric. Oral mucosa moist without lesions, posterior pharynx clear.  Neck supple. No JVD.  Lymphatics:no cervical,supraclavicular adenopathy Resp: clear to auscultation bilaterally and normal percussion bilaterally Cardio: regular rate and rhythm. No gallop. GI: abdomen obese, soft, nontender, not distended, no mass or organomegaly. Normally active bowel sounds. Surgical incision packed and dressed, did not remove packing however good granulation tissue evident, no odor, no surrounding erythema and dressings dry. JP insertion site minimal erythema, not tender, suture intact. ~ 30 cc cloudy whitish fluid with particles in drain. Musculoskeletal/ Extremities: without pitting edema, cords, tenderness Neuro: minimal residual peripheral neuropathy. Otherwise nonfocal. PSYCH appropriate mood and affect Skin without rash, ecchymosis, petechiae Portacath-without erythema or tenderness  Lab Results:  Results for orders placed or performed in visit on 09/06/16  CBC with Differential  Result Value Ref Range   WBC 8.7 3.9 - 10.3 10e3/uL   NEUT# 6.0 1.5 - 6.5 10e3/uL   HGB 8.8 (L) 11.6 - 15.9 g/dL  HCT 28.4 (L) 34.8 - 46.6 %   Platelets 417 (H) 145 - 400 10e3/uL   MCV 89.3 79.5 - 101.0 fL   MCH 27.7 25.1 - 34.0 pg   MCHC 31.0 (L) 31.5 - 36.0 g/dL   RBC 3.18 (L) 3.70 - 5.45 10e6/uL   RDW 16.2 (H) 11.2 - 14.5 %   lymph# 1.9 0.9 - 3.3 10e3/uL   MONO# 0.7 0.1 - 0.9 10e3/uL   Eosinophils Absolute 0.1 0.0 - 0.5 10e3/uL   Basophils Absolute 0.0 0.0 - 0.1 10e3/uL   NEUT% 68.8 38.4 - 76.8 %   LYMPH% 21.6 14.0 - 49.7 %   MONO% 8.0 0.0 - 14.0 %   EOS% 1.4  0.0 - 7.0 %   BASO% 0.2 0.0 - 2.0 %  Comprehensive metabolic panel  Result Value Ref Range   Sodium 138 136 - 145 mEq/L   Potassium 3.7 3.5 - 5.1 mEq/L   Chloride 101 98 - 109 mEq/L   CO2 25 22 - 29 mEq/L   Glucose 95 70 - 140 mg/dl   BUN 12.9 7.0 - 26.0 mg/dL   Creatinine 0.7 0.6 - 1.1 mg/dL   Total Bilirubin <0.30 0.20 - 1.20 mg/dL   Alkaline Phosphatase 88 40 - 150 U/L   AST 17 5 - 34 U/L   ALT <9 0 - 55 U/L   Total Protein 7.2 6.4 - 8.3 g/dL   Albumin 2.7 (L) 3.5 - 5.0 g/dL   Calcium 9.5 8.4 - 10.4 mg/dL   Anion Gap 12 (H) 3 - 11 mEq/L   EGFR >90 >90 ml/min/1.73 m2   CBC reviewed at time of visit  Patient is given collection equipment for stool for C diff   Studies/Results:   MedicatioCT Abdomen Pelvis W Contrast8/27/2017 [facility] Result Addenda  Addendum by Artist Beach, MD on 08/27/2016 11:52 AM Results were discussed with primary obstetrics/gynecology service on  08/27/2016 at approximately 11:30 AM by Dr. Bridget Hartshorn  Result Impression   1. Enlarging 14 x 5 cm collection of fluid with new gas, primarily located within the central pelvis but with extension into the perihepatic space and left upper quadrant. This is suspicious for abscess resulting from known wound dehiscence. There is also likely peritonitis.  2. Unchanged wall thickening of multiple loops of distal ileum in the right lower quadrant as well as remnant ascending colon. This is compatible with known residual disease in this location.  Result Narrative  EXAM: CT abdomen and pelvis with contrast DATE: 08/26/2016 10:29 PM ACCESSION: [id number] UN DICTATED: 08/27/2016 12:45 AM INTERPRETATION LOCATION: [place]  CLINICAL INDICATION: Fever, history of stage IV serous ovarian cancer with recurrent small bowel obstruction managed surgically.  COMPARISON: CT abdomen pelvis 08/03/2016  TECHNIQUE: A spiral CT scan was obtained with IV contrast from the lung bases to the pubic symphysis.Images  were reconstructed in the axial plane. Coronal and sagittal reformatted images were also provided for further evaluation.  FINDINGS:   The liver, gallbladder, spleen, pancreas, adrenal glands, and kidneys are unremarkable. No biliary ductal dilatation.   Postsurgical changes following ileal to transverse colon anastomosis. The ascending colon remains in place, and there is unchanged wall thickening surrounding this as well as multiple loops of distal ileum, compatible with known residual disease in this location. Oral contrast extends from the stomach to the sigmoid colon without extravasation. No evidence of bowel obstruction.   There is an enlarging loculated fluid collection located primarily anterior to the pelvic loops of bowel measuring  14.2 x 4.7 cm, previously 13.4 x 4.1 cm with new nodules of gas. A small amount of gas and fluid tracks up along the peritoneum into the perihepatic space and into the left upper quadrant  The bladder is mildly distended. Uterus is absent. Ovaries not identified, may also be absent.  The aorta, celiac, SMA, and IMA are patent and normal in caliber. The portal vein, SMV and splenic vein are patient.  Subcentimeter mesenteric and retroperitoneal lymph nodes are present.  There is new ventral wound dehiscence.   No acute osseous abnormality.     CTA Chest Pe8/27/2017 [facility] Result Impression   No pulmonary embolism.   Interval resolution of small left pleural effusion.  Please see concurrent CT abdomen and pelvis report for discussion of abdominal findings.  Result Narrative  EXAM: CTA CHEST PE DATE: 08/26/2016 10:29 PM ACCESSION: [id number] UN DICTATED: 08/27/2016 12:29 AM INTERPRETATION LOCATION: [place]  CLINICAL INDICATION: Fever.  COMPARISON: CTA chest 08/03/2016  TECHNIQUE: A spiral CTA scan was obtained with IV contrast from the lung apices to the lung bases. Images were reconstructed in the axial plane. Multiplanar  reformatted and MIP images were provided.  FINDINGS:  AIRWAYS, LUNGS, PLEURA: Clear central tracheobronchial tree. Azygous lobe noted. No lung consolidation. Interval resolution of small left pleural effusion.  PULMONARY ARTERIES: No pulmonary embolism.  MEDIASTINUM: Right IJ Port-A-Cath terminates in the proximal right atrium. Normal heart size. No pericardial effusion. Normal caliber thoracic aorta. No mediastinal lymphadenopathy.  IMAGED ABDOMEN: Small volume free fluid noted in the upper abdomen.  SOFT TISSUES: Unremarkable.  BONES: Unremarkable    CT Drain Cath Plcmt Peritoneal Or Retroperitoneal8/29/2017 [facility] Result Impression  Successful CT-guided placement of a percutaneous 10-F locking pigtail catheter into a pelvic fluid collection.   Result Narrative  EXAM: CT-GUIDED PERCUTANEOUS DRAINAGE CATHETER PLACEMENT  DATE: 08/28/2016 6:28 PM ACCESSION: [id number] UN DICTATED: 08/28/2016 7:11 PM INTERPRETATION LOCATION: [place]  CLINICAL INDICATION: 30 years old Female: drain intraabdominal abscess--  CONSENT: Informed consent was obtained from the patient including a discussion of the alternatives, benefits, and risks including but not limited to infection, bleeding, need for additional procedure, and/or ICU admission.  CT GUIDED DRAIN PLACEMENT: The procedure was performed in CT with the patient supine. Limited axial CT images were obtaineddemonstrating is gas and fluid collection in the pelvis. These images were saved and sent to PACS. A skin entry site was identified and was prepped and draped usingall elements of maximal sterile barrier technique.  A 15 cm 18-G needle was inserted into the collection using axial CT images for guidance. After entry into the fluidcollection, a 0.038-inch guidewire was inserted through the needle. The needle was exchanged for a dilator and the tract wasdilated. Over the guidewire, a 10-F locking pigtail catheter was placed  into the collection.   Aspiration of the catheter returned 30 mL of purulent fluid and a specimen was sent for culture. The tube was placed to [place], secured to the skin with a suture and dressed in a sterile manner.      Echocardiogram 6-16-17in    MEDICATIONS: I have reviewed the patient's current medications. Continue flagyl and levaquin  DISCUSSION Interval history discussed. As doxil is generally tolerated well and is not expected to have marked cytopenias or rapid drop in counts, seems reasonable given pace of disease to proceed with first doxil on 09-07-16. I will see her back on 9-11.  Continue antibiotics. 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 Malignant pleural fluid and ascites at presentation. Persistent disease after 6 cycles carbo taxol, changed to [place] taxotere due to neuropathy, avastin added beginning cycle 3 03-26-16; Carbo taxotere avastin continued thru cycle 5 chemo on 05-10-16. Restaging done due to increasing CA 125, with hypermetabolic uptake by PET but no measurable disease on CT such that she was not eligible for nivolumab trial or another [facility] trial. Briefly started on niraparib, then bowel obstruction. She had exploratory lap 07-29-16 with ileotransverse colon bypass of bowel obstruction due to progressive malignancy. She had drainage of intraabdominal abscess/ fluid on 08-28-16. First doxil to be given 09-07-16. Will add gCSF if needed.  Consider adding avastin ~ q 2 weeks after healing from recent surgery Genetics testing negative for germline BRCA mutation.  2.IR drain in place for intraabdominal abscess/ fluid with follow up 09-12-16. Open surgical wound from laparotomy: mother doing wound care and gyn onc following.  3.peripheral neuropathy essentially resolved off of  taxane. Gabapentin helpful previously.  4.power PAC 5.chemo neutropenia with carbo taxane previously. 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 7. Hemoglobin lower since surgery: resume oral iron and follow. Not symptomatic enough to transfuse now, and hopefully doxil will not drop counts, but would transfuse if <8 or symptomatic 8.weight loss >20 lbs since June with bowel obstruction and surgery. Will ask [facility] nutritionist to see coordinating with other appointment.  9.Surgical menopause with hot flashes 10. Has declined information on advance directives    All questions answered. Chemo orders confirmed. Neulasta available if needed, will consider this when I see her next week given open wound and drain. Time spent 40 min including >50% counseling and coordination of care. Cc Dr Alycia Rossetti and route PCP   Evlyn Clines, MD   09/06/2016, 9:10 PM

## 2016-09-07 ENCOUNTER — Other Ambulatory Visit (HOSPITAL_COMMUNITY)
Admission: AD | Admit: 2016-09-07 | Discharge: 2016-09-07 | Disposition: A | Discharge status: Home / Self Care | Payer: BC Managed Care – PPO | Source: Ambulatory Visit | Attending: Oncology | Admitting: Oncology

## 2016-09-07 ENCOUNTER — Other Ambulatory Visit: Payer: BC Managed Care – PPO

## 2016-09-07 ENCOUNTER — Ambulatory Visit (HOSPITAL_BASED_OUTPATIENT_CLINIC_OR_DEPARTMENT_OTHER): Payer: BC Managed Care – PPO

## 2016-09-07 VITALS — BP 120/75 | HR 122 | Temp 98.7°F | Resp 18

## 2016-09-07 DIAGNOSIS — C562 Malignant neoplasm of left ovary: Secondary | ICD-10-CM | POA: Diagnosis not present

## 2016-09-07 DIAGNOSIS — N739 Female pelvic inflammatory disease, unspecified: Secondary | ICD-10-CM | POA: Insufficient documentation

## 2016-09-07 DIAGNOSIS — C561 Malignant neoplasm of right ovary: Secondary | ICD-10-CM

## 2016-09-07 DIAGNOSIS — Z5111 Encounter for antineoplastic chemotherapy: Secondary | ICD-10-CM | POA: Diagnosis not present

## 2016-09-07 DIAGNOSIS — Z8719 Personal history of other diseases of the digestive system: Secondary | ICD-10-CM | POA: Insufficient documentation

## 2016-09-07 DIAGNOSIS — C569 Malignant neoplasm of unspecified ovary: Secondary | ICD-10-CM

## 2016-09-07 DIAGNOSIS — D6481 Anemia due to antineoplastic chemotherapy: Secondary | ICD-10-CM | POA: Insufficient documentation

## 2016-09-07 DIAGNOSIS — T451X5A Adverse effect of antineoplastic and immunosuppressive drugs, initial encounter: Secondary | ICD-10-CM

## 2016-09-07 LAB — C DIFFICILE QUICK SCREEN W PCR REFLEX
C DIFFICILE (CDIFF) INTERP: NOT DETECTED
C Diff antigen: NEGATIVE
C Diff toxin: NEGATIVE

## 2016-09-07 MED ORDER — SODIUM CHLORIDE 0.9% FLUSH
10.0000 mL | INTRAVENOUS | Status: DC | PRN
  Administered 2016-09-07: 10 mL
  Filled 2016-09-07: qty 10

## 2016-09-07 MED ORDER — DEXTROSE 5 % IV SOLN
Freq: Once | INTRAVENOUS | Status: AC
  Administered 2016-09-07: 16:00:00 via INTRAVENOUS

## 2016-09-07 MED ORDER — SODIUM CHLORIDE 0.9 % IV SOLN
Freq: Once | INTRAVENOUS | Status: AC
  Administered 2016-09-07: 16:00:00 via INTRAVENOUS
  Filled 2016-09-07: qty 5

## 2016-09-07 MED ORDER — HEPARIN SOD (PORK) LOCK FLUSH 100 UNIT/ML IV SOLN
500.0000 [IU] | Freq: Once | INTRAVENOUS | Status: AC | PRN
  Administered 2016-09-07: 500 [IU]
  Filled 2016-09-07: qty 5

## 2016-09-07 MED ORDER — DOXORUBICIN HCL LIPOSOMAL CHEMO INJECTION 2 MG/ML
39.0000 mg/m2 | Freq: Once | INTRAVENOUS | Status: AC
  Administered 2016-09-07: 90 mg via INTRAVENOUS
  Filled 2016-09-07: qty 20

## 2016-09-07 MED ORDER — DOXORUBICIN HCL LIPOSOMAL CHEMO INJECTION 2 MG/ML: 40.0000 mg/m2 | Freq: Once | INTRAVENOUS | Status: DC

## 2016-09-07 MED ORDER — SODIUM CHLORIDE 0.9 % IV SOLN
Freq: Once | INTRAVENOUS | Status: AC
  Administered 2016-09-07: 16:00:00 via INTRAVENOUS
  Filled 2016-09-07: qty 4

## 2016-09-07 NOTE — Progress Notes (Signed)
Found per [facility] note for port placement.  EXAM: XR CHEST PA AND LATERAL  DATE: 08/26/2016 2:28 PM  ACCESSION: [id number]  DICTATED: 08/26/2016 4:45 PM  INTERPRETATION LOCATION: [place]    CLINICAL INDICATION: 30 years old Female with [place] with a previous examination dated 08/01/2016     TECHNIQUE: Upright PA and lateral views of the chest    FINDINGS:   The lungs are again slightly hypoinflated. Right internal jugular central venous catheter with external port appear unchanged with tip overlying distal superior vena cava in apparent satisfactory position.    Mediastinal and cardiac contours are normal. Heart size is normal. The lungs are clear without focal infiltrate, effusion, edema, nor pneumothorax. Lateral projection suggests interval marked improvement if not resolution of previously noted small posterior left pleural effusion.

## 2016-09-07 NOTE — Progress Notes (Signed)
Per Dr Jana Hakim ok to proceed with treatment with elevated heart rate as it is the patients norm.

## 2016-09-07 NOTE — Patient Instructions (Signed)
[place] Discharge Instructions for Patients Receiving Chemotherapy  Today you received the following chemotherapy agents Doxorubicin HCL Liposomal  To help prevent nausea and vomiting after your treatment, we encourage you to take your nausea medication as directed.   If you develop nausea and vomiting that is not controlled by your nausea medication, call the clinic.   BELOW ARE SYMPTOMS THAT SHOULD BE REPORTED IMMEDIATELY:  *FEVER GREATER THAN 100.5 F  *CHILLS WITH OR WITHOUT FEVER  NAUSEA AND VOMITING THAT IS NOT CONTROLLED WITH YOUR NAUSEA MEDICATION  *UNUSUAL SHORTNESS OF BREATH  *UNUSUAL BRUISING OR BLEEDING  TENDERNESS IN MOUTH AND THROAT WITH OR WITHOUT PRESENCE OF ULCERS  *URINARY PROBLEMS  *BOWEL PROBLEMS  UNUSUAL RASH Items with * indicate a potential emergency and should be followed up as soon as possible.  Feel free to call the clinic you have any questions or concerns. The clinic phone number is [phone].  Please show the [facility] at check-in to the Emergency Department and triage nurse.     Doxorubicin Liposomal injection What is this medicine? LIPOSOMAL DOXORUBICIN (LIP oh som al dox oh ROO bi sin) is a chemotherapy drug. This medicine is used to treat many kinds of cancer like Kaposi's sarcoma, multiple myeloma, and ovarian cancer. This medicine may be used for other purposes; ask your health care provider or pharmacist if you have questions. What should I tell my health care provider before I take this medicine? They need to know if you have any of these conditions: -blood disorders -heart disease -infection (especially a virus infection such as chickenpox, cold sores, or herpes) -liver disease -recent or ongoing radiation therapy -an unusual or allergic reaction to doxorubicin, other chemotherapy agents, soybeans, other medicines, foods, dyes, or preservatives -pregnant or trying to get pregnant -breast-feeding How  should I use this medicine? This drug is given as an infusion into a vein. It is administered in a hospital or clinic by a specially trained health care professional. If you have pain, swelling, burning or any unusual feeling around the site of your injection, tell your health care professional right away. Talk to your pediatrician regarding the use of this medicine in children. Special care may be needed. Overdosage: If you think you have taken too much of this medicine contact a poison control center or emergency room at once. NOTE: This medicine is only for you. Do not share this medicine with others. What if I miss a dose? It is important not to miss your dose. Call your doctor or health care professional if you are unable to keep an appointment. What may interact with this medicine? Do not take this medicine with any of the following medications: -zidovudine This medicine may also interact with the following medications: -medicines to increase blood counts like filgrastim, pegfilgrastim, sargramostim -vaccines Talk to your doctor or health care professional before taking any of these medicines: -acetaminophen -aspirin -ibuprofen -ketoprofen -naproxen This list may not describe all possible interactions. Give your health care provider a list of all the medicines, herbs, non-prescription drugs, or dietary supplements you use. Also tell them if you smoke, drink alcohol, or use illegal drugs. Some items may interact with your medicine. What should I watch for while using this medicine? Your condition will be monitored carefully while you are receiving this medicine. You will need important blood work done while you are taking this medicine. This drug may make you feel generally unwell. This is not uncommon, as chemotherapy can affect  healthy cells as well as cancer cells. Report any side effects. Continue your course of treatment even though you feel ill unless your doctor tells you to  stop. Your urine may turn orange-red for a few days after your dose. This is not blood. If your urine is dark or brown, call your doctor. In some cases, you may be given additional medicines to help with side effects. Follow all directions for their use. Call your doctor or health care professional for advice if you get a fever (100.5 degrees F or higher), chills or sore throat, or other symptoms of a cold or flu. Do not treat yourself. This drug decreases your body's ability to fight infections. Try to avoid being around people who are sick. This medicine may increase your risk to bruise or bleed. Call your doctor or health care professional if you notice any unusual bleeding. Be careful brushing and flossing your teeth or using a toothpick because you may get an infection or bleed more easily. If you have any dental work done, tell your dentist you are receiving this medicine. Avoid taking products that contain aspirin, acetaminophen, ibuprofen, naproxen, or ketoprofen unless instructed by your doctor. These medicines may hide a fever. Men and women of childbearing age should use effective birth control methods while using taking this medicine. Do not become pregnant while taking this medicine. There is a potential for serious side effects to an unborn child. Talk to your health care professional or pharmacist for more information. Do not breast-feed an infant while taking this medicine. Talk to your doctor about your risk of cancer. You may be more at risk for certain types of cancers if you take this medicine. What side effects may I notice from receiving this medicine? Side effects that you should report to your doctor or health care professional as soon as possible: -allergic reactions like skin rash, itching or hives, swelling of the face, lips, or tongue -low blood counts - this medicine may decrease the number of white blood cells, red blood cells and platelets. You may be at increased risk for  infections and bleeding. -signs of hand-foot syndrome - tingling or burning, redness, flaking, swelling, small blisters, or small sores on the palms of your hands or the soles of your feet -signs of infection - fever or chills, cough, sore throat, pain or difficulty passing urine -signs of decreased platelets or bleeding - bruising, pinpoint red spots on the skin, black, tarry stools, blood in the urine -signs of decreased red blood cells - unusually weak or tired, fainting spells, lightheadedness -back pain, chills, facial flushing, fever, headache, tightness in the chest or throat during the infusion -breathing problems -chest pain -fast, irregular heartbeat -mouth pain, redness, sores -pain, swelling, redness at site where injected -pain, tingling, numbness in the hands or feet -swelling of ankles, feet, or hands -vomiting Side effects that usually do not require medical attention (report to your doctor or health care professional if they continue or are bothersome): -diarrhea -hair loss -loss of appetite -nail discoloration or damage -nausea -red or watery eyes -red colored urine -stomach upset This list may not describe all possible side effects. Call your doctor for medical advice about side effects. You may report side effects to FDA at 1-800-FDA-1088. Where should I keep my medicine? This drug is given in a hospital or clinic and will not be stored at home. NOTE: This sheet is a summary. It may not cover all possible information. If you have questions about  this medicine, talk to your doctor, pharmacist, or health care provider.    2016, [employer]vier/[employer]. (2012-09-05 10:12:56)

## 2016-09-09 ENCOUNTER — Other Ambulatory Visit: Payer: Self-pay | Admitting: Oncology

## 2016-09-10 ENCOUNTER — Ambulatory Visit (HOSPITAL_BASED_OUTPATIENT_CLINIC_OR_DEPARTMENT_OTHER): Payer: BC Managed Care – PPO

## 2016-09-10 ENCOUNTER — Encounter: Payer: Self-pay | Admitting: Nutrition

## 2016-09-10 ENCOUNTER — Encounter: Payer: Self-pay | Admitting: Oncology

## 2016-09-10 ENCOUNTER — Encounter: Payer: BC Managed Care – PPO | Admitting: Nutrition

## 2016-09-10 ENCOUNTER — Ambulatory Visit (HOSPITAL_BASED_OUTPATIENT_CLINIC_OR_DEPARTMENT_OTHER): Payer: BC Managed Care – PPO | Admitting: Oncology

## 2016-09-10 ENCOUNTER — Other Ambulatory Visit (HOSPITAL_BASED_OUTPATIENT_CLINIC_OR_DEPARTMENT_OTHER): Payer: BC Managed Care – PPO

## 2016-09-10 VITALS — BP 115/92 | HR 121 | Temp 98.5°F | Resp 18 | Ht 65.0 in | Wt 230.3 lb

## 2016-09-10 DIAGNOSIS — G620 Drug-induced polyneuropathy: Secondary | ICD-10-CM

## 2016-09-10 DIAGNOSIS — R112 Nausea with vomiting, unspecified: Secondary | ICD-10-CM

## 2016-09-10 DIAGNOSIS — N739 Female pelvic inflammatory disease, unspecified: Secondary | ICD-10-CM

## 2016-09-10 DIAGNOSIS — Z95828 Presence of other vascular implants and grafts: Secondary | ICD-10-CM

## 2016-09-10 DIAGNOSIS — C562 Malignant neoplasm of left ovary: Secondary | ICD-10-CM

## 2016-09-10 DIAGNOSIS — C561 Malignant neoplasm of right ovary: Secondary | ICD-10-CM

## 2016-09-10 DIAGNOSIS — E860 Dehydration: Secondary | ICD-10-CM | POA: Diagnosis not present

## 2016-09-10 DIAGNOSIS — C569 Malignant neoplasm of unspecified ovary: Secondary | ICD-10-CM

## 2016-09-10 DIAGNOSIS — Z8719 Personal history of other diseases of the digestive system: Secondary | ICD-10-CM

## 2016-09-10 DIAGNOSIS — D701 Agranulocytosis secondary to cancer chemotherapy: Secondary | ICD-10-CM | POA: Diagnosis not present

## 2016-09-10 LAB — COMPREHENSIVE METABOLIC PANEL
ALBUMIN: 2.9 g/dL — AB (ref 3.5–5.0)
ALK PHOS: 91 U/L (ref 40–150)
ALT: 10 U/L (ref 0–55)
AST: 16 U/L (ref 5–34)
Anion Gap: 13 mEq/L — ABNORMAL HIGH (ref 3–11)
BILIRUBIN TOTAL: 0.31 mg/dL (ref 0.20–1.20)
BUN: 10.8 mg/dL (ref 7.0–26.0)
CALCIUM: 10.2 mg/dL (ref 8.4–10.4)
CO2: 26 mEq/L (ref 22–29)
CREATININE: 0.7 mg/dL (ref 0.6–1.1)
Chloride: 102 mEq/L (ref 98–109)
EGFR: >90 mL/min/{1.73_m2} (ref >90)
Glucose: 100 mg/dl (ref 70–140)
Potassium: 3.5 mEq/L (ref 3.5–5.1)
Sodium: 141 mEq/L (ref 136–145)
TOTAL PROTEIN: 7.7 g/dL (ref 6.4–8.3)

## 2016-09-10 LAB — CBC WITH DIFFERENTIAL/PLATELET
BASO%: 0.9 % (ref 0.0–2.0)
BASOS ABS: 0.1 10*3/uL (ref 0.0–0.1)
EOS%: 0.1 % (ref 0.0–7.0)
Eosinophils Absolute: 0 10*3/uL (ref 0.0–0.5)
HEMATOCRIT: 30.9 % — AB (ref 34.8–46.6)
HEMOGLOBIN: 9.9 g/dL — AB (ref 11.6–15.9)
LYMPH#: 1.3 10*3/uL (ref 0.9–3.3)
LYMPH%: 11.6 % — ABNORMAL LOW (ref 14.0–49.7)
MCH: 27.8 pg (ref 25.1–34.0)
MCHC: 31.9 g/dL (ref 31.5–36.0)
MCV: 86.9 fL (ref 79.5–101.0)
MONO#: 0.8 10*3/uL (ref 0.1–0.9)
MONO%: 7.6 % (ref 0.0–14.0)
NEUT%: 79.8 % — ABNORMAL HIGH (ref 38.4–76.8)
NEUTROS ABS: 8.6 10*3/uL — AB (ref 1.5–6.5)
Platelets: 500 10*3/uL — ABNORMAL HIGH (ref 145–400)
RBC: 3.55 10*6/uL — ABNORMAL LOW (ref 3.70–5.45)
RDW: 17.3 % — AB (ref 11.2–14.5)
WBC: 10.8 10*3/uL — AB (ref 3.9–10.3)

## 2016-09-10 MED ORDER — PROCHLORPERAZINE EDISYLATE 5 MG/ML IJ SOLN
INTRAMUSCULAR | Status: AC
  Filled 2016-09-10: qty 2

## 2016-09-10 MED ORDER — FAMOTIDINE IN NACL 20-0.9 MG/50ML-% IV SOLN
INTRAVENOUS | Status: AC
  Filled 2016-09-10: qty 50

## 2016-09-10 MED ORDER — SODIUM CHLORIDE 0.9 % IV SOLN
Freq: Once | INTRAVENOUS | Status: AC
  Administered 2016-09-10: 17:00:00 via INTRAVENOUS

## 2016-09-10 MED ORDER — ONDANSETRON HCL 40 MG/20ML IJ SOLN
Freq: Once | INTRAMUSCULAR | Status: AC | PRN
Start: 2016-09-10 — End: 2016-09-10
  Administered 2016-09-10: 17:00:00 via INTRAVENOUS
  Filled 2016-09-10: qty 4

## 2016-09-10 MED ORDER — PROCHLORPERAZINE EDISYLATE 5 MG/ML IJ SOLN
10.0000 mg | Freq: Once | INTRAMUSCULAR | Status: AC
  Administered 2016-09-10: 10 mg via INTRAVENOUS

## 2016-09-10 MED ORDER — FAMOTIDINE IN NACL 20-0.9 MG/50ML-% IV SOLN
20.0000 mg | Freq: Two times a day (BID) | INTRAVENOUS | Status: DC
  Administered 2016-09-10: 20 mg via INTRAVENOUS

## 2016-09-10 NOTE — Progress Notes (Signed)
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 She had ileotransverse colonic bypass at [facility] on 07-29-16 for bowel obstruction and has abdominal drain by [facility] for culture negative post operative fluid. She continues levaquin and flagyl per [facility]. She had first doxil on 09-07-16, with EMEND and zofran; she has not had gCSF with doxil. She has follow up with [facility] IR on 09-12-16 for drain, reportedly additional imaging planned then.   Patient had no problems with doxil infusion on 09-07-16. She had increasing nausea and vomiting beginning 9-10 and ongoing, not able to keep down liquids or po antibiotics (has missed 2 doses). Last vomiting was 3.5 hrs prior to this visit. She has not been febrile, no shaking chills. She has "soreness" across mid abdomen. She has not had bowel movement in last 24 hrs. She is voiding. She denies SOB or cough. Surgical wound continues to improve, less packing needed, no drainage or bleeding. The JP had 50 cc out this AM, which is more than usual, site ok. Not able to sleep last pm. No problems with PAC. Remainder of 10 point Review of Systems unchanged.   Power PAC placed at [facility] 09-09-15 Genetics testing 10-27-15 normal ([facility] panel). 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 She was seen at [facility] with concern for acute appendicitis. She was evaluated at local ED, I believe [facility], with CT reportedly showing large left and small right  pleural effusions, moderate ascites, complex septated cystic mass in low pelvis into left abdomen 13 x 7 x 7 cm, adenopathy Including large diaphragmatic lymph nodes anterior to heart and numerous mesenteric nodes, and apparent carcinomatosis with implants in omentum up to 3.8 cm. She was seen by Dr [name] and referred to Dr Alycia Rossetti. At Dr Elenora Gamma exam 08-31-15 there was large mass in cul de sac and abdominal fluid wave, decreased BS left lower 1/2 of chest. Lab studies included CA 125 33,145; inhibin B <10; CEA <0.5, AFP 2.2, LDH 1281 and HCG negative. Surgery by Dr Alycia Rossetti at [facility] on 09-06-15 was exploratory laparotomy with supracervical hysterectomy, BSO, omentectomy, resection with primary reanastomosis of rectosigmoid and peritoneal stripping. Intraoperative findings included 3 liters of bloody ascites, 12 cm omental mass adherent to uterus, sigmoid colon and rectum which was resected en bloc, bladder nodule resected. At completion of surgery there was residual tumor along entirety of small bowel mesentery with multiple nodules up to 1.5 cm, miliary disease on diaphragm, and peritoneal nodularity along pelvis and rectum (R2 resection). 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 No nodes submitted.  She was transfused 2 units PRBCs on each 9-9 and 09-13-15 for hemoglobin as low as 6.8 - 7.1. She had urgent right thoracentesis for 750 cc on 09-08-15 with cytology positive for high grade serous carcinoma ([id number]) and left thoracentesis for 400 cc on 09-09-15. CT angio chest on POD 1 was negative for  PE. She tolerated morphine by PCA post operatively. She had mild cellulitis at abdominal incision on day of DC, begun on Keflex then. Psychiatry saw in hospital, begun on zoloft. She was discharged home with 28 days of lovenox. She saw Dr Alycia Rossetti on 09-19-15 with staples removed,  however wound opened that evening. She was seen by gyn onc provider on 09-23-15, with wound open 3 x 5 cm, 3 cm deep, no evidence of infection. Wet to dry dressings bid were begun, ongoing. She had follow up wound check on 09-27-15, no findings of concern. GOG protocol at [facility] had delay, so that treatment given off protocol with first cycle of carboplatin taxol at [facility] on 09-26-15. She had additional 5 cycles of carboplatin taxol at [facility] thru 12-19-15. Restaging CT CAP showed no diffuse peritoneal disease remaining, small ascites and small right pleural effusion, thickened retrocecal appendix, right external iliac node 0.7 cm. Chemo continued with 2 cycles of carboplatin taxotere thru 02-27-16. Avastin was added cycle 3,4,5 thru 05-10-16. CA 125 marker from 63 on 04-19-16 to 89 on 05-10-16 and 197 on 05-21-16. Repeat imaging with PET 05-31-16 then CT CAP on 06-14-16 had nonmeasurable disease based on hypermetabolic uptake in paracolic gutters, right adnexa, vaginal cuff. With nonmeasurable disease she was not eligible for [id number] Nivolumab/ Ipilimumab study and likewise not eligible for another study at [facility].  CA 125 807 on 07-02-16. 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 She was readmitted to [facility] 8-26 to 08-29-16 with fever, abdominal abscess / fluid collection drained, cultures mixed enteric organisms    Objective:  Vital signs in last 24 hours:  BP (!) 115/92 (BP Location: Left Arm, Patient Position: Sitting) Comment: informed nurse  Pulse (!) 121 Comment: informed nurse  Temp 98.5 F (36.9 C) (Oral)   Resp 18   Ht 5' 5"  (1.651 m)   Wt 230 lb 4.8 oz (104.5 kg)   SpO2 98%   BMI 38.32 kg/m  Weight down 5 lbs from 09-06-16.  Alert, oriented and appropriate. Looks generally uncomfortable and  holding emesis bag, able to reposition on exam table without assistance. Respirations not labored. Not diaphoretic. No alopecia  HEENT:PERRL, sclerae not icteric. Oral mucosa slightly dry without lesions, posterior pharynx clear. No JVD.  Lymphatics:no supraclavicular adenopathy Resp: clear to auscultation bilaterally and normal percussion bilaterally Cardio: tachy,  regular rate and rhythm. No gallop. GI: abdomen soft,  not distended. Midline incision packed, dressing clean and dry, lower incision closed. JP drain LLQ with ~ 20 cc slightly cloudy whitish fluid, more clear than at exam last week.  Minimal bowel sounds.  Musculoskeletal/ Extremities: LE without pitting edema, cords, tenderness Neuro: no peripheral neuropathy. Otherwise nonfocal. PSYCH appropriate mood and affect Skin without rash, ecchymosis, petechiae Portacath-without erythema or tenderness  Patient seen again in infusion, receiving IV antiemetics and IVF. Drowsy, looks more comfortable, respirations not labored and PAC functioning well.   Lab Results:  Results for orders placed or performed in visit on 09/10/16  CBC with Differential  Result Value Ref Range   WBC 10.8 (H) 3.9 - 10.3 10e3/uL   NEUT# 8.6 (H) 1.5 - 6.5 10e3/uL   HGB 9.9 (L) 11.6 - 15.9 g/dL   HCT 30.9 (L) 34.8 - 46.6 %  Platelets 500 (H) 145 - 400 10e3/uL   MCV 86.9 79.5 - 101.0 fL   MCH 27.8 25.1 - 34.0 pg   MCHC 31.9 31.5 - 36.0 g/dL   RBC 3.55 (L) 3.70 - 5.45 10e6/uL   RDW 17.3 (H) 11.2 - 14.5 %   lymph# 1.3 0.9 - 3.3 10e3/uL   MONO# 0.8 0.1 - 0.9 10e3/uL   Eosinophils Absolute 0.0 0.0 - 0.5 10e3/uL   Basophils Absolute 0.1 0.0 - 0.1 10e3/uL   NEUT% 79.8 (H) 38.4 - 76.8 %   LYMPH% 11.6 (L) 14.0 - 49.7 %   MONO% 7.6 0.0 - 14.0 %   EOS% 0.1 0.0 - 7.0 %   BASO% 0.9 0.0 - 2.0 %  Comprehensive metabolic panel  Result Value Ref Range   Sodium 141 136 - 145 mEq/L   Potassium 3.5  3.5 - 5.1 mEq/L   Chloride 102 98 - 109 mEq/L   CO2 26 22 - 29 mEq/L   Glucose 100 70 - 140 mg/dl   BUN 10.8 7.0 - 26.0 mg/dL   Creatinine 0.7 0.6 - 1.1 mg/dL   Total Bilirubin 0.31 0.20 - 1.20 mg/dL   Alkaline Phosphatase 91 40 - 150 U/L   AST 16 5 - 34 U/L   ALT 10 0 - 55 U/L   Total Protein 7.7 6.4 - 8.3 g/dL   Albumin 2.9 (L) 3.5 - 5.0 g/dL   Calcium 10.2 8.4 - 10.4 mg/dL   Anion Gap 13 (H) 3 - 11 mEq/L   EGFR >90 >90 ml/min/1.73 m2    Labs reviewed at time of visit and copy given to mother.  Studies/Results:  No results found.  Medications: I have reviewed the patient's current medications.  DISCUSSION Degree of nausea and vomiting seems excessive for doxil, which generally does not have much associated nausea, tho patient has had problems with nausea with all of chemo previously. Able to add on for IVF and IV antiemetics despite late afternoon visit. Have asked that patient/ mother let us know how she is on 9-12, tho if problems persist likely best to be reevaluated at [facility] as high risk for another bowel obstruction.   I have spoken with mother separately as above.  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 Malignant pleural fluid and ascites at presentation. Persistent disease after 6 cycles carbo taxol, changed to [place] taxotere due to neuropathy, avastin added beginning cycle 3 03-26-16 thru cycle 5 . Restaging done due to increasing CA 125, with hypermetabolic uptake by PET but no measurable disease on CT such that she was not eligible for nivolumab trial or another [facility] trial. Briefly started on niraparib, then bowel obstruction. She had exploratory lap 07-29-16 with ileotransverse colon bypass of bowel obstruction due to progressive malignancy. She had drainage of intraabdominal abscess/ fluid on 08-28-16. First doxil to be 09-07-16. Will add gCSF if needed.   Plan to consider adding avastin ~ q 2 weeks after healing from recent surgery Genetics testing negative for germline BRCA mutation.  2.Nausea and vomiting x 24+ hours: 1 liter NS now with IV antiemetics. If continues will need evaluation for recurrent bowel obstruction, likely best at [facility] where recent surgical and IR interventions. 3. IR drain in place for intraabdominal abscess/ fluid with follow up 09-12-16. Open surgical wound from laparotomy: mother doing wound care and gyn onc following.  4.power PAC 5.chemo neutropenia with carbo taxane  previously. 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 7. Hemoglobin lower since surgery: resume oral iron and follow. Not symptomatic enough to transfuse now, and hopefully doxil will not drop counts, but would transfuse if <8 or symptomatic 8.weight loss >20 lbs since June with bowel obstruction and surgery. Will ask [place] nutritionist to see coordinating with other appointment.  9.Surgical menopause with hot flashes 10. Has declined information on advance directives 11. peripheral neuropathy essentially resolved off of taxane. Gabapentin helpful previously.11.   Questions answered now and patient seemed more comfortable in infusion with IVs in progress. CC Dr Alycia Rossetti, RN or NP to follow up with patient / mother by phone on 9-12. Time spent 25 min including >50% counseling and coordination of care. Will need additional appointments set up in [place] depending on situation in next day or so.    Sheena Clines, MD   09/10/2016, 7:55 PM

## 2016-09-10 NOTE — Patient Instructions (Signed)

## 2016-09-10 NOTE — Progress Notes (Unsigned)
Patient cancelled nutrition appointment today. She only wants to see MD.

## 2016-09-11 ENCOUNTER — Telehealth: Payer: Self-pay | Admitting: Gynecologic Oncology

## 2016-09-11 NOTE — Telephone Encounter (Signed)
Called patient at the request of Dr. Marko Plume to check on her current status.  "I am doing better than yesterday.  Still nauseous but able to keep compazine and zofran down, which I am alternating.  I had two big purges at 2 am and 8 am but small purges after that just because I didn't feel good.  I have already contacted [facility] and they said they could possibly add me on tomorrow for fluids if needed.  I go to have my drain looked at tomorrow."  She has been able to keep small amount of ginger ale and water down today , which "was better than yesterday."  Information will be forwarded to Dr. Marko Plume.  Patient advised to call for any needs and advised to contact [facility] as well for any significant changes.  Advised patient that Dr. Mariana Kaufman RN is working on arranging appts for Friday but they may be subject to change depending on how she is doing, etc.

## 2016-09-13 ENCOUNTER — Other Ambulatory Visit: Payer: Self-pay | Admitting: Oncology

## 2016-09-14 ENCOUNTER — Ambulatory Visit (HOSPITAL_BASED_OUTPATIENT_CLINIC_OR_DEPARTMENT_OTHER): Payer: BC Managed Care – PPO

## 2016-09-14 ENCOUNTER — Other Ambulatory Visit: Payer: Self-pay | Admitting: Oncology

## 2016-09-14 ENCOUNTER — Ambulatory Visit (HOSPITAL_BASED_OUTPATIENT_CLINIC_OR_DEPARTMENT_OTHER): Payer: BC Managed Care – PPO | Admitting: Oncology

## 2016-09-14 ENCOUNTER — Ambulatory Visit (HOSPITAL_COMMUNITY)
Admission: RE | Admit: 2016-09-14 | Discharge: 2016-09-14 | Disposition: A | Discharge status: Home / Self Care | Payer: BC Managed Care – PPO | Source: Ambulatory Visit | Attending: Oncology | Admitting: Oncology

## 2016-09-14 ENCOUNTER — Encounter: Payer: Self-pay | Admitting: Oncology

## 2016-09-14 VITALS — BP 129/95 | HR 128 | Temp 98.4°F | Resp 20 | Ht 65.0 in | Wt 221.6 lb

## 2016-09-14 DIAGNOSIS — Z95828 Presence of other vascular implants and grafts: Secondary | ICD-10-CM

## 2016-09-14 DIAGNOSIS — C562 Malignant neoplasm of left ovary: Secondary | ICD-10-CM

## 2016-09-14 DIAGNOSIS — E860 Dehydration: Secondary | ICD-10-CM

## 2016-09-14 DIAGNOSIS — J910 Malignant pleural effusion: Secondary | ICD-10-CM

## 2016-09-14 DIAGNOSIS — C561 Malignant neoplasm of right ovary: Secondary | ICD-10-CM | POA: Diagnosis not present

## 2016-09-14 DIAGNOSIS — R112 Nausea with vomiting, unspecified: Secondary | ICD-10-CM

## 2016-09-14 DIAGNOSIS — C569 Malignant neoplasm of unspecified ovary: Secondary | ICD-10-CM

## 2016-09-14 DIAGNOSIS — D701 Agranulocytosis secondary to cancer chemotherapy: Secondary | ICD-10-CM

## 2016-09-14 DIAGNOSIS — C7989 Secondary malignant neoplasm of other specified sites: Secondary | ICD-10-CM | POA: Diagnosis not present

## 2016-09-14 DIAGNOSIS — Z8719 Personal history of other diseases of the digestive system: Secondary | ICD-10-CM

## 2016-09-14 DIAGNOSIS — Z9889 Other specified postprocedural states: Secondary | ICD-10-CM | POA: Diagnosis not present

## 2016-09-14 DIAGNOSIS — R180 Malignant ascites: Secondary | ICD-10-CM

## 2016-09-14 MED ORDER — PROCHLORPERAZINE 25 MG RE SUPP: 25.0000 mg | Freq: Four times a day (QID) | RECTAL | 2 refills | Status: AC | PRN

## 2016-09-14 MED ORDER — SODIUM CHLORIDE 0.9 % IV SOLN
INTRAVENOUS | Status: DC
  Administered 2016-09-14: 11:00:00 via INTRAVENOUS

## 2016-09-14 MED ORDER — LORAZEPAM 1 MG PO TABS: ORAL_TABLET | ORAL | 0 refills | Status: AC

## 2016-09-14 MED ORDER — SODIUM CHLORIDE 0.9 % IV SOLN
Freq: Once | INTRAVENOUS | Status: AC
  Administered 2016-09-14: 11:00:00 via INTRAVENOUS
  Filled 2016-09-14: qty 4

## 2016-09-14 MED ORDER — FAMOTIDINE IN NACL 20-0.9 MG/50ML-% IV SOLN: 20.0000 mg | Freq: Once | INTRAVENOUS | Status: DC

## 2016-09-14 MED ORDER — FAMOTIDINE IN NACL 20-0.9 MG/50ML-% IV SOLN
INTRAVENOUS | Status: AC
  Filled 2016-09-14: qty 50

## 2016-09-14 MED ORDER — SODIUM CHLORIDE 0.9 % IV SOLN: Freq: Once | INTRAVENOUS | Status: DC

## 2016-09-14 MED ORDER — FAMOTIDINE IN NACL 20-0.9 MG/50ML-% IV SOLN
20.0000 mg | Freq: Once | INTRAVENOUS | Status: AC
  Administered 2016-09-14: 20 mg via INTRAVENOUS

## 2016-09-14 MED ORDER — CYCLOBENZAPRINE HCL 10 MG PO TABS: ORAL_TABLET | ORAL | 0 refills | Status: AC

## 2016-09-14 MED ORDER — SODIUM CHLORIDE 0.9% FLUSH
10.0000 mL | INTRAVENOUS | Status: DC | PRN
  Administered 2016-09-14: 10 mL via INTRAVENOUS
  Filled 2016-09-14: qty 10

## 2016-09-14 MED ORDER — SODIUM CHLORIDE 0.9 % IV SOLN: INTRAVENOUS | Status: DC

## 2016-09-14 MED ORDER — HEPARIN SOD (PORK) LOCK FLUSH 100 UNIT/ML IV SOLN
500.0000 [IU] | Freq: Once | INTRAVENOUS | Status: AC
  Administered 2016-09-14: 500 [IU] via INTRAVENOUS
  Filled 2016-09-14: qty 5

## 2016-09-14 NOTE — Addendum Note (Signed)
Addended by: Flo Shanks on: 09/14/2016 03:00 PM   Modules accepted: Orders, SmartSet

## 2016-09-14 NOTE — Progress Notes (Signed)
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 She had first doxil on 09-07-16, no gCSF thus far with doxil. Unable to begin doxil yet due to recent surgical procedures. She had ileotransverse colonic bypass of bowel obstruction at [facility] on 07-29-16, with surgical wound improving but not yet healed, and JP drain in by [facility] IR for abdominal fluid collection, culture negative but treatment ongoing with flagyl and levaquin per [facility].   Patient has had difficulty with vomiting and poor po intake since day 3 cycle 1 doxil, ongoing. She had IVF at [facility] on 09-10-16, at [facility] with drain evaluation on 09-12-16, and will have IVF again here today. Weight is down 9 lbs this week. She vomited at least 3x thru day yesterday and 3x during night; she is not able to keep down compazine or zofran po. She has eaten 1/2 popsicle this AM without vomiting. Bowels have been loose, C diff negative on 09-07-16. No fever. Midline abdominal wound is progressively improving, no bleeding or drainage and not tender. The intraabdominal fluid collection was stable at repeat imaging by [facility] IR on 09-12-16 per patient (that information not yet in [place]), drain still with whitish fluid with sediment, drain tube a little uncomfortable as before. She has follow up with [facility] IR on 09-26-16.  No SOB. Heating pad helpful with back. Is voiding. No problems with PAC. She is generally weak, has not tried to work this week. Remainder of 10 point Review of Systems negative.  Power PAC placed at [facility] 09-09-15 Genetics testing 10-27-15 normal ([facility] panel). [facility] tumor testing found mutation in pten, myc , and p53  ONCOLOGIC HISTORY Patient initially  developed constipation and abdominal pain summer 2016 which she thought was stress related, then worsening abdominal and pelvic pain, some back pain and some fever; she had weight gain ~ 30 lbs in prior 5-6 months. She was seen at [facility] with concern for acute appendicitis. She was evaluated at local ED, I believe [facility], with CT reportedly showing large left and small right pleural effusions, moderate ascites, complex septated cystic mass in low pelvis into left abdomen 13 x 7 x 7 cm, adenopathy Including large diaphragmatic lymph nodes anterior to heart and numerous mesenteric nodes, and apparent carcinomatosis with implants in omentum up to 3.8 cm. She was seen by Dr [name] and referred to Dr Alycia Rossetti. At Dr Elenora Gamma exam 08-31-15 there was large mass in cul de sac and abdominal fluid wave, decreased BS left lower 1/2 of chest. Lab studies included CA 125 33,145; inhibin B <10; CEA <0.5, AFP 2.2, LDH 1281 and HCG negative. Surgery by Dr Alycia Rossetti at [facility] on 09-06-15 was exploratory laparotomy with supracervical hysterectomy, BSO, omentectomy, resection with primary reanastomosis of rectosigmoid and peritoneal stripping. Intraoperative findings included 3 liters of bloody ascites, 12 cm omental mass adherent to uterus, sigmoid colon and rectum which was resected en bloc, bladder nodule resected. At completion of surgery there was residual tumor along entirety of small bowel mesentery with multiple nodules up to 1.5 cm, miliary disease on diaphragm, and peritoneal nodularity along pelvis and rectum (R2 resection). 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 No nodes submitted.  She was transfused 2 units PRBCs on each 9-9 and 09-13-15 for hemoglobin as low as 6.8 - 7.1. She had urgent right thoracentesis for 750 cc on 09-08-15 with cytology positive for  high grade serous carcinoma ([id number]) and left thoracentesis for 400 cc on 09-09-15. CT angio chest on POD 1 was negative for PE. She tolerated morphine by PCA post operatively. She had mild cellulitis at abdominal incision on day of DC, begun on Keflex then. Psychiatry saw in hospital, begun on zoloft. She was discharged home with 28 days of lovenox. She saw Dr Alycia Rossetti on 09-19-15 with staples removed, however wound opened that evening. She was seen by gyn onc provider on 09-23-15, with wound open 3 x 5 cm, 3 cm deep, no evidence of infection. Wet to dry dressings bid were begun, ongoing. She had follow up wound check on 09-27-15, no findings of concern. GOG protocol at [facility] had delay, so that treatment given off protocol with first cycle of carboplatin taxol at [facility] on 09-26-15. She had additional 5 cycles of carboplatin tax[facility] at [facility] thru 12-19-15. Restaging CT CAP showed no diffuse peritoneal disease remaining, small ascites and small right pleural effusion, thickened retrocecal appendix, right external iliac node 0.7 cm. Chemo continued with 2 cycles of carboplatin taxotere thru 02-27-16. Avastin was added cycle 3,4,5 thru 05-10-16. CA 125 marker from 63 on 04-19-16 to 89 on 05-10-16 and 197 on 05-21-16. Repeat imaging with PET 05-31-16 then CT CAP on 06-14-16 had nonmeasurable disease based on hypermetabolic uptake in paracolic gutters, right adnexa, vaginal cuff. With nonmeasurable disease she was not eligible for [id number]-[id number] Nivolumab/ Ipilimumab study and likewise not eligible for another study at [facility]. CA 125 807 on 07-02-16. 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 She was readmitted to [facility] 8-26 to 08-29-16 with fever, abdominal  abscess / fluid collection drained, cultures mixed enteric organisms. First doxil 09-07-16.    Objective:  Vital signs in last 24 hours:  BP (!) 129/95 (BP Location: Left Arm, Patient Position: Sitting) Comment: informed nurse  Pulse (!) 128   Temp 98.4 F (36.9 C) (Oral)   Resp 20   Ht 5' 5"  (1.651 m)   Wt 221 lb 9.6 oz (100.5 kg)   SpO2 98%   BMI 36.88 kg/m  Weight down from 230 on 09-10-16. Looks more comfortable today, able to change position easily on exam table, ambulatory in office. Respirations not labored. Not diaphoretic.  Alert, oriented and appropriate. No alopecia  HEENT:PERRL, sclerae not icteric. Oral mucosa somewhat dry without lesions, posterior pharynx clear.  Neck supple. No JVD.  Lymphatics:no cervical,supraclavicular adenopathy Resp: clear to auscultation bilaterally and normal percussion bilaterally Cardio: tachy,  regular rate and rhythm. No gallop. GI: soft, nontender, not distended, no appreciable mass or organomegaly. A few bowel sounds. Surgical incision closed lower 3/4, clean granulation tissue at much smaller open area superiorly. JP drain with slightly cloudy whitish fluid with particles, insertion site a little more erythema at suture, no drainage.  Musculoskeletal/ Extremities: without pitting edema, cords, tenderness Neuro: no peripheral neuropathy. Otherwise nonfocal Skin without rash, ecchymosis, petechiae. Erythema on back with pattern of the heating pad, no burns. Portacath-without erythema or tenderness  Lab Results:  Results for orders placed or performed in visit on 09/10/16  CBC with Differential  Result Value Ref Range   WBC 10.8 (H) 3.9 - 10.3 10e3/uL   NEUT# 8.6 (H) 1.5 - 6.5 10e3/uL   HGB 9.9 (L) 11.6 - 15.9 g/dL   HCT 30.9 (L) 34.8 - 46.6 %   Platelets 500 (H) 145 - 400 10e3/uL   MCV 86.9 79.5 - 101.0 fL   MCH 27.8 25.1 - 34.0 pg   MCHC 31.9 31.5 - 36.0 g/dL   RBC 3.55 (L) 3.70 - 5.45 10e6/uL   RDW 17.3 (H) 11.2 - 14.5 %    lymph# 1.3 0.9 - 3.3 10e3/uL   MONO# 0.8 0.1 - 0.9 10e3/uL   Eosinophils Absolute 0.0 0.0 - 0.5 10e3/uL   Basophils Absolute 0.1 0.0 - 0.1 10e3/uL   NEUT% 79.8 (H) 38.4 - 76.8 %   LYMPH% 11.6 (L) 14.0 - 49.7 %   MONO% 7.6 0.0 - 14.0 %   EOS% 0.1 0.0 - 7.0 %   BASO% 0.9 0.0 - 2.0 %  Comprehensive metabolic panel  Result Value Ref Range   Sodium 141 136 - 145 mEq/L   Potassium 3.5 3.5 - 5.1 mEq/L   Chloride 102 98 - 109 mEq/L   CO2 26 22 - 29 mEq/L   Glucose 100 70 - 140 mg/dl   BUN 10.8 7.0 - 26.0 mg/dL   Creatinine 0.7 0.6 - 1.1 mg/dL   Total Bilirubin 0.31 0.20 - 1.20 mg/dL   Alkaline Phosphatase 91 40 - 150 U/L   AST 16 5 - 34 U/L   ALT 10 0 - 55 U/L   Total Protein 7.7 6.4 - 8.3 g/dL   Albumin 2.9 (L) 3.5 - 5.0 g/dL   Calcium 10.2 8.4 - 10.4 mg/dL   Anion Gap 13 (H) 3 - 11 mEq/L   EGFR >90 >90 ml/min/1.73 m2   Labs above from 09-10-16  Studies/Results:  Dg Abd 2 Views  Result Date: 09/14/2016 CLINICAL DATA:  Nausea, vomiting and diarrhea for 4 days. History of ovarian cancer. EXAM: ABDOMEN - 2 VIEW COMPARISON:  None. FINDINGS: The bowel gas pattern is normal.  There is no evidence of free air. Catheter overlying the lower left abdomen is noted. No suspicious calcifications or bony abnormalities noted. IMPRESSION: No acute abnormality.  Unremarkable bowel gas pattern. Electronically Signed   By: Margarette Canada M.D.   On: 09/14/2016 15:49   MD let patient know results of abdominal Xrays by phone [phone].  Medications: I have reviewed the patient's current medications. Refill ativan which she can use SL, try compazine suppositories.  Mother to start topical antibiotic to JP insertion site.  Message to managed care as [facility] authorization expires 09-28-16.   DISCUSSION IVF today, and will try to schedule also early next week and with my next visit on 09-20-16. Nausea and vomiting seem excessive for doxil, tho also antibiotics including flagyl, abdominal drain, recent surgery etc.  Abdominal xray as above after visit.  Told patient not to try to advance beyond liquids until 48 hrs without vomiting. If fever or worsening symptoms over weekend, she should be in touch with [facility]  Meds as above  Will give IVF if possible/ if infusion space available early next week and I will see her with IVF on 09-20-16.  She is due #2 doxil on 9-28 if stable.  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 Malignant pleural fluid and ascites at presentation. Persistent disease after 6 cycles carbo taxol, changed to [place] taxotere due to neuropathy, avastin added beginning cycle 3 03-26-16 thru cycle 5 . Restaging done due to increasing CA 125, with hypermetabolic uptake by PET but no measurable disease on CT such that she was not eligible for nivolumab trial or another [facility] trial. Briefly started on niraparib, then bowel obstruction. Exploratory lap 7-30-17with ileotransverse colon bypass of bowel obstruction due to progressive malignancy. IR drainage of intraabdominal abscess/ fluid  08-28-16. First doxil  09-07-16. Difficult time with N/V, no bowel obstruction by xray today. IVF and antiemetics.  Plan to consider addingavastin ~ q 2 weeks after healing from recent surgery Genetics testing negative for germline BRCA mutation.  2.Nausea and vomiting likely multifactorial, no bowel obstruction by xray now. IVF, clear liquids as tolerated, antiemetics.  3. IR drain in place for intraabdominal abscess/ fluid, for next evaluation 08-2716. Open surgical wound from laparotomy: mother doing wound care and gyn onc following, much improved. 4.power PAC 5.chemo neutropenia with carbo taxane previously. WIll follow counts on doxil as she may not need gCSF with this 6.Hemoglobin lower since surgery: nausea prohibits oral iron now.  7.weight loss >20lbs since June with bowel obstruction and surgery.   [place] nutritionist involved 8.Surgical menopause with hot flashes 9. Has declined information on advance directives 10. peripheral neuropathy essentially resolved off of taxane. Gabapentin helpful previously.   All questions answered, IVF orders placed. They have contact #s if needed over weekend. Time spent 25 min including >50% counseling and coordination of care. Cc Dr Alycia Rossetti    Evlyn Clines, MD   09/14/2016, 4:27 PM

## 2016-09-14 NOTE — Patient Instructions (Signed)

## 2016-09-17 ENCOUNTER — Other Ambulatory Visit (HOSPITAL_BASED_OUTPATIENT_CLINIC_OR_DEPARTMENT_OTHER): Payer: BC Managed Care – PPO

## 2016-09-17 ENCOUNTER — Ambulatory Visit: Payer: BC Managed Care – PPO

## 2016-09-17 ENCOUNTER — Ambulatory Visit (HOSPITAL_BASED_OUTPATIENT_CLINIC_OR_DEPARTMENT_OTHER): Payer: BC Managed Care – PPO

## 2016-09-17 ENCOUNTER — Ambulatory Visit: Payer: BC Managed Care – PPO | Admitting: Oncology

## 2016-09-17 DIAGNOSIS — C561 Malignant neoplasm of right ovary: Secondary | ICD-10-CM

## 2016-09-17 DIAGNOSIS — C569 Malignant neoplasm of unspecified ovary: Secondary | ICD-10-CM

## 2016-09-17 DIAGNOSIS — C562 Malignant neoplasm of left ovary: Secondary | ICD-10-CM | POA: Diagnosis not present

## 2016-09-17 DIAGNOSIS — R112 Nausea with vomiting, unspecified: Secondary | ICD-10-CM | POA: Diagnosis not present

## 2016-09-17 LAB — CBC WITH DIFFERENTIAL/PLATELET
BASO%: 0.4 % (ref 0.0–2.0)
Basophils Absolute: 0 10*3/uL (ref 0.0–0.1)
EOS%: 1.1 % (ref 0.0–7.0)
Eosinophils Absolute: 0.1 10*3/uL (ref 0.0–0.5)
HCT: 34 % — ABNORMAL LOW (ref 34.8–46.6)
HGB: 10.7 g/dL — ABNORMAL LOW (ref 11.6–15.9)
LYMPH%: 29 % (ref 14.0–49.7)
MCH: 27.8 pg (ref 25.1–34.0)
MCHC: 31.5 g/dL (ref 31.5–36.0)
MCV: 88.3 fL (ref 79.5–101.0)
MONO#: 0.3 10*3/uL (ref 0.1–0.9)
MONO%: 4.8 % (ref 0.0–14.0)
NEUT%: 64.7 % (ref 38.4–76.8)
NEUTROS ABS: 3.7 10*3/uL (ref 1.5–6.5)
PLATELETS: 387 10*3/uL (ref 145–400)
RBC: 3.85 10*6/uL (ref 3.70–5.45)
RDW: 17.5 % — ABNORMAL HIGH (ref 11.2–14.5)
WBC: 5.7 10*3/uL (ref 3.9–10.3)
lymph#: 1.6 10*3/uL (ref 0.9–3.3)

## 2016-09-17 LAB — COMPREHENSIVE METABOLIC PANEL
ALT: 19 U/L (ref 0–55)
AST: 31 U/L (ref 5–34)
Albumin: 2.7 g/dL — ABNORMAL LOW (ref 3.5–5.0)
Alkaline Phosphatase: 148 U/L (ref 40–150)
Anion Gap: 16 mEq/L — ABNORMAL HIGH (ref 3–11)
BILIRUBIN TOTAL: 0.86 mg/dL (ref 0.20–1.20)
BUN: 13.4 mg/dL (ref 7.0–26.0)
CHLORIDE: 93 meq/L — AB (ref 98–109)
CO2: 27 meq/L (ref 22–29)
CREATININE: 0.6 mg/dL (ref 0.6–1.1)
Calcium: 10.1 mg/dL (ref 8.4–10.4)
EGFR: >90 mL/min/{1.73_m2} (ref >90)
GLUCOSE: 85 mg/dL (ref 70–140)
Potassium: 3.4 mEq/L — ABNORMAL LOW (ref 3.5–5.1)
SODIUM: 137 meq/L (ref 136–145)
TOTAL PROTEIN: 7.6 g/dL (ref 6.4–8.3)

## 2016-09-17 MED ORDER — SODIUM CHLORIDE 0.9% FLUSH
10.0000 mL | INTRAVENOUS | Status: DC | PRN
  Administered 2016-09-17: 10 mL via INTRAVENOUS
  Filled 2016-09-17: qty 10

## 2016-09-17 MED ORDER — HEPARIN SOD (PORK) LOCK FLUSH 100 UNIT/ML IV SOLN
500.0000 [IU] | Freq: Once | INTRAVENOUS | Status: AC
  Administered 2016-09-17: 500 [IU] via INTRAVENOUS
  Filled 2016-09-17: qty 5

## 2016-09-17 MED ORDER — FAMOTIDINE IN NACL 20-0.9 MG/50ML-% IV SOLN
20.0000 mg | Freq: Once | INTRAVENOUS | Status: AC
  Administered 2016-09-17: 20 mg via INTRAVENOUS

## 2016-09-17 MED ORDER — FAMOTIDINE IN NACL 20-0.9 MG/50ML-% IV SOLN
INTRAVENOUS | Status: AC
  Filled 2016-09-17: qty 50

## 2016-09-17 MED ORDER — ONDANSETRON HCL 40 MG/20ML IJ SOLN
Freq: Once | INTRAMUSCULAR | Status: AC
  Administered 2016-09-17: 15:00:00 via INTRAVENOUS
  Filled 2016-09-17: qty 4

## 2016-09-17 MED ORDER — SODIUM CHLORIDE 0.9 % IV SOLN
INTRAVENOUS | Status: DC
  Administered 2016-09-17: 14:00:00 via INTRAVENOUS

## 2016-09-17 NOTE — Patient Instructions (Signed)

## 2016-09-17 NOTE — Patient Instructions (Signed)

## 2016-09-19 ENCOUNTER — Other Ambulatory Visit: Payer: Self-pay | Admitting: Oncology

## 2016-09-19 DIAGNOSIS — C569 Malignant neoplasm of unspecified ovary: Secondary | ICD-10-CM

## 2016-09-20 ENCOUNTER — Other Ambulatory Visit (HOSPITAL_BASED_OUTPATIENT_CLINIC_OR_DEPARTMENT_OTHER): Payer: BC Managed Care – PPO

## 2016-09-20 ENCOUNTER — Ambulatory Visit (HOSPITAL_BASED_OUTPATIENT_CLINIC_OR_DEPARTMENT_OTHER): Payer: BC Managed Care – PPO | Admitting: Oncology

## 2016-09-20 ENCOUNTER — Encounter: Payer: Self-pay | Admitting: Oncology

## 2016-09-20 ENCOUNTER — Ambulatory Visit (HOSPITAL_BASED_OUTPATIENT_CLINIC_OR_DEPARTMENT_OTHER): Payer: BC Managed Care – PPO

## 2016-09-20 ENCOUNTER — Ambulatory Visit: Payer: BC Managed Care – PPO

## 2016-09-20 VITALS — BP 125/98 | HR 145 | Temp 98.5°F | Resp 18 | Ht 65.0 in | Wt 217.3 lb

## 2016-09-20 DIAGNOSIS — C569 Malignant neoplasm of unspecified ovary: Secondary | ICD-10-CM

## 2016-09-20 DIAGNOSIS — C562 Malignant neoplasm of left ovary: Secondary | ICD-10-CM | POA: Diagnosis not present

## 2016-09-20 DIAGNOSIS — K1230 Oral mucositis (ulcerative), unspecified: Secondary | ICD-10-CM | POA: Diagnosis not present

## 2016-09-20 DIAGNOSIS — C561 Malignant neoplasm of right ovary: Secondary | ICD-10-CM

## 2016-09-20 DIAGNOSIS — D701 Agranulocytosis secondary to cancer chemotherapy: Secondary | ICD-10-CM

## 2016-09-20 DIAGNOSIS — R634 Abnormal weight loss: Secondary | ICD-10-CM

## 2016-09-20 DIAGNOSIS — R112 Nausea with vomiting, unspecified: Secondary | ICD-10-CM | POA: Diagnosis not present

## 2016-09-20 DIAGNOSIS — E860 Dehydration: Secondary | ICD-10-CM

## 2016-09-20 DIAGNOSIS — N739 Female pelvic inflammatory disease, unspecified: Secondary | ICD-10-CM

## 2016-09-20 LAB — CBC WITH DIFFERENTIAL/PLATELET
BASO%: 0.6 % (ref 0.0–2.0)
BASOS ABS: 0 10*3/uL (ref 0.0–0.1)
EOS%: 0.7 % (ref 0.0–7.0)
Eosinophils Absolute: 0 10*3/uL (ref 0.0–0.5)
HEMATOCRIT: 33.5 % — AB (ref 34.8–46.6)
HEMOGLOBIN: 10.9 g/dL — AB (ref 11.6–15.9)
LYMPH#: 1.6 10*3/uL (ref 0.9–3.3)
LYMPH%: 29.9 % (ref 14.0–49.7)
MCH: 27.8 pg (ref 25.1–34.0)
MCHC: 32.5 g/dL (ref 31.5–36.0)
MCV: 85.5 fL (ref 79.5–101.0)
MONO#: 0.5 10*3/uL (ref 0.1–0.9)
MONO%: 8.5 % (ref 0.0–14.0)
NEUT#: 3.2 10*3/uL (ref 1.5–6.5)
NEUT%: 60.3 % (ref 38.4–76.8)
PLATELETS: 474 10*3/uL — AB (ref 145–400)
RBC: 3.91 10*6/uL (ref 3.70–5.45)
RDW: 18.1 % — AB (ref 11.2–14.5)
WBC: 5.4 10*3/uL (ref 3.9–10.3)

## 2016-09-20 LAB — COMPREHENSIVE METABOLIC PANEL
ALK PHOS: 165 U/L — AB (ref 40–150)
ALT: 12 U/L (ref 0–55)
AST: 23 U/L (ref 5–34)
Albumin: 2.8 g/dL — ABNORMAL LOW (ref 3.5–5.0)
Anion Gap: 18 mEq/L — ABNORMAL HIGH (ref 3–11)
BUN: 15.8 mg/dL (ref 7.0–26.0)
CHLORIDE: 85 meq/L — AB (ref 98–109)
CO2: 34 meq/L — AB (ref 22–29)
Calcium: 10.3 mg/dL (ref 8.4–10.4)
Creatinine: 0.8 mg/dL (ref 0.6–1.1)
GLUCOSE: 98 mg/dL (ref 70–140)
POTASSIUM: 3.2 meq/L — AB (ref 3.5–5.1)
SODIUM: 136 meq/L (ref 136–145)
Total Bilirubin: 0.53 mg/dL (ref 0.20–1.20)
Total Protein: 7.8 g/dL (ref 6.4–8.3)

## 2016-09-20 MED ORDER — SODIUM CHLORIDE 0.9% FLUSH
10.0000 mL | INTRAVENOUS | Status: DC | PRN
  Administered 2016-09-20: 10 mL via INTRAVENOUS
  Filled 2016-09-20: qty 10

## 2016-09-20 MED ORDER — SODIUM CHLORIDE 0.9 % IV SOLN
Freq: Once | INTRAVENOUS | Status: AC | PRN
  Administered 2016-09-20: 13:00:00 via INTRAVENOUS
  Filled 2016-09-20: qty 4

## 2016-09-20 MED ORDER — HEPARIN SOD (PORK) LOCK FLUSH 100 UNIT/ML IV SOLN
500.0000 [IU] | Freq: Once | INTRAVENOUS | Status: AC
  Administered 2016-09-20: 500 [IU] via INTRAVENOUS
  Filled 2016-09-20: qty 5

## 2016-09-20 MED ORDER — SODIUM CHLORIDE 0.9 % IV SOLN
INTRAVENOUS | Status: DC
  Administered 2016-09-20: 13:00:00 via INTRAVENOUS

## 2016-09-20 NOTE — Progress Notes (Signed)
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 She has JP drain in place for abdominal fluid collections following ileotransverse colon bypass of malignant bowel obstruction, procedures done at [facility].  She has not had gCSF with this first doxil She has IR appointment at [facility] next week in follow up of drain.  Patient has been vomiting essentially all po's for past 11 days. She has had intermittent IVF at this office and at [facility], which helps only short term. She has new oral mucositis, likely thrush + doxil mucositis. Surgical wound continues to improve, less packing required. She had small formed bowel movement this AM. She has had no fever. She is generally weak, tho ambulatory into office. She denies SOB at rest. No problems with PAC. She has low back pain, heating pad gives some relief. She cannot get comfortable enough to sleep.  Remainder of 10 point Review of Systems negative/ unchanged.   Power PAC placed at [facility] 09-09-15 Genetics testing 10-27-15 normal ([facility] panel). 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 She was seen at [facility] with concern for acute appendicitis. She was evaluated at local ED, I believe [facility], with CT reportedly showing large left and small right pleural effusions, moderate ascites, complex septated cystic mass in low pelvis into left abdomen 13 x 7 x 7 cm, adenopathy Including large diaphragmatic lymph nodes  anterior to heart and numerous mesenteric nodes, and apparent carcinomatosis with implants in omentum up to 3.8 cm. She was seen by Dr [name] and referred to Dr Alycia Rossetti. At Dr Elenora Gamma exam 08-31-15 there was large mass in cul de sac and abdominal fluid wave, decreased BS left lower 1/2 of chest. Lab studies included CA 125 33,145; inhibin B <10; CEA <0.5, AFP 2.2, LDH 1281 and HCG negative. Surgery by Dr Alycia Rossetti at [facility] on 09-06-15 was exploratory laparotomy with supracervical hysterectomy, BSO, omentectomy, resection with primary reanastomosis of rectosigmoid and peritoneal stripping. Intraoperative findings included 3 liters of bloody ascites, 12 cm omental mass adherent to uterus, sigmoid colon and rectum which was resected en bloc, bladder nodule resected. At completion of surgery there was residual tumor along entirety of small bowel mesentery with multiple nodules up to 1.5 cm, miliary disease on diaphragm, and peritoneal nodularity along pelvis and rectum (R2 resection). 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 No nodes submitted.  She was transfused 2 units PRBCs on each 9-9 and 09-13-15 for hemoglobin as low as 6.8 - 7.1. She had urgent right thoracentesis for 750 cc on 09-08-15 with cytology positive for high grade serous carcinoma ([id number]) and left thoracentesis for 400 cc on 09-09-15. CT angio chest on POD 1 was negative for PE. She tolerated morphine by PCA post operatively. She had mild cellulitis at abdominal incision on day of DC, begun on Keflex then. Psychiatry saw in hospital, begun on zoloft. She was discharged  home with 28 days of lovenox. She saw Dr Alycia Rossetti on 09-19-15 with staples removed, however wound opened that evening. She was seen by gyn onc provider on 09-23-15, with wound open 3 x 5 cm, 3 cm deep, no evidence of infection. Wet to dry dressings  bid were begun, ongoing. She had follow up wound check on 09-27-15, no findings of concern. GOG protocol at [facility] had delay, so that treatment given off protocol with first cycle of carboplatin taxol at [facility] on 09-26-15. She had additional 5 cycles of carboplatin taxol at [facility] thru 12-19-15. Restaging CT CAP showed no diffuse peritoneal disease remaining, small ascites and small right pleural effusion, thickened retrocecal appendix, right external iliac node 0.7 cm. Chemo continued with 2 cycles of carboplatin taxotere thru 02-27-16. Avastin was added cycle 3,4,5 thru 05-10-16. CA 125 marker from 63 on 04-19-16 to 89 on 05-10-16 and 197 on 05-21-16. Repeat imaging with PET 05-31-16 then CT CAP on 06-14-16 had nonmeasurable disease based on hypermetabolic uptake in paracolic gutters, right adnexa, vaginal cuff. With nonmeasurable disease she was not eligible for [id number]-[id number] Nivolumab/ Ipilimumab study and likewise not eligible for another study at [facility]. CA 125 807 on 07-02-16. Niraparib planned, however she had bowel obstruction just as that drug was started, hospitalized at [facility] 7-17 thru 07-20-16, then admitted to 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 She was readmitted to [facility] 8-26 to 08-29-16 with fever, abdominal abscess / fluid collection drained, cultures mixed enteric organisms. First doxil given 09-07-16   Objective:  Vital signs in last 24 hours:  BP (!) 125/98 (BP Location: Right Arm, Patient Position: Sitting)   Pulse (!) 145   Temp 98.5 F (36.9 C) (Oral)   Resp 18   Ht _0  (1.651 m)   Wt 217 lb 4.8 oz (98.6 kg)   SpO2 97%   BMI 36.16 kg/m  Weight down another 4 lbs Alert, oriented, cooperative, crying due to ongoing symptoms. Ambulatory into office.   HEENT:PERRL, sclerae not icteric. Oral mucosa moist, mucous membranes erythematous with areas of  desquamation and probable thrush on tongue, posterior pharynx similar.  Neck supple. No JVD.  Lymphatics:no supraclavicular adenopathy Resp: clear to auscultation bilaterally and normal percussion bilaterally Cardio: regular rate and rhythm. No gallop. GI: soft, nontender in epigastrium, not distended. Quiet. Surgical incision packing in place superiorly, improving. JP drain site with slight erythema just at sutures, clear fluid in drain.. Musculoskeletal/ Extremities: without pitting edema, cords, tenderness Neuro:  nonfocal  PSYCH tearful as above Skin  palms ok, otherwise without rash, ecchymosis, petechiae Portacath-without erythema or tenderness  Lab Results:  Results for orders placed or performed in visit on 09/20/16  CBC with Differential  Result Value Ref Range   WBC 5.4 3.9 - 10.3 10e3/uL   NEUT# 3.2 1.5 - 6.5 10e3/uL   HGB 10.9 (L) 11.6 - 15.9 g/dL   HCT 33.5 (L) 34.8 - 46.6 %   Platelets 474 (H) 145 - 400 10e3/uL   MCV 85.5 79.5 - 101.0 fL   MCH 27.8 25.1 - 34.0 pg   MCHC 32.5 31.5 - 36.0 g/dL   RBC 3.91 3.70 - 5.45 10e6/uL   RDW 18.1 (H) 11.2 - 14.5 %   lymph# 1.6 0.9 - 3.3 10e3/uL   MONO# 0.5 0.1 - 0.9 10e3/uL   Eosinophils Absolute 0.0 0.0 - 0.5 10e3/uL   Basophils Absolute  0.0 0.0 - 0.1 10e3/uL   NEUT% 60.3 38.4 - 76.8 %   LYMPH% 29.9 14.0 - 49.7 %   MONO% 8.5 0.0 - 14.0 %   EOS% 0.7 0.0 - 7.0 %   BASO% 0.6 0.0 - 2.0 %  Comprehensive metabolic panel  Result Value Ref Range   Sodium 136 136 - 145 mEq/L   Potassium 3.2 (L) 3.5 - 5.1 mEq/L   Chloride 85 (L) 98 - 109 mEq/L   CO2 34 (H) 22 - 29 mEq/L   Glucose 98 70 - 140 mg/dl   BUN 15.8 7.0 - 26.0 mg/dL   Creatinine 0.8 0.6 - 1.1 mg/dL   Total Bilirubin 0.53 0.20 - 1.20 mg/dL   Alkaline Phosphatase 165 (H) 40 - 150 U/L   AST 23 5 - 34 U/L   ALT 12 0 - 55 U/L   Total Protein 7.8 6.4 - 8.3 g/dL   Albumin 2.8 (L) 3.5 - 5.0 g/dL   Calcium 10.3 8.4 - 10.4 mg/dL   Anion Gap 18 (H) 3 - 11 mEq/L   EGFR >90 >90  ml/min/1.73 m2     Studies/Results: Abdominal films with same symptoms on 09-14-16 did not show bowel obstruction.  Medications: I have reviewed the patient's current medications. Suggest diflucan and biotene/ viscous lidocaine  DISCUSSION Persistent N/V with recurrent dehydration and ongoing weight loss. Needs admission for evaluation and more continuous IVF. Discussed with gyn oncology and will be direct admit to [facility] gyn oncology this afternoon.  She was given 1 liter NS while arrangements for admission to [facility] were coordinated.    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 Malignant pleural fluid and ascites at presentation. Persistent disease after 6 cycles carbo taxol, changed to [place] taxotere due to neuropathy, avastin added beginning cycle 3 03-26-16 thru cycle 5 . Restaging done due to increasing CA 125, with hypermetabolic uptake by PET but no measurable disease on CT such that she was not eligible for nivolumab trial or another [facility] trial. Briefly started on niraparib, then bowel obstruction. She had exploratory lap 7-30-17with ileotransverse colon bypass of bowel obstruction due to progressive malignancy. She had drainage of intraabdominal abscess/ fluid on 08-28-16.  First doxil  09-07-16. Mucositis likely doxil related. Counts may decrease over next week, has not had gCSF yet. Cannot addavastin until surgical wounds healed. Genetics testing negative for germline BRCA mutation.  2. Persistent nausea and vomiting, with dehydration. Admit to [facility] per communication with Dr Alycia Rossetti now 3. IR drain in place for intraabdominal abscess/ fluid. Open surgical wound from laparotomy: mother doing wound care and gyn onc following.  4.power PAC 5.chemo neutropenia with carbo taxane previously. Has not had gCSF since first doxil, recommend following counts. 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 7.weight loss >20lbs since June with bowel obstruction and surgery. Will ask [place] nutritionist to see coordinating with other appointment.  8.Surgical menopause with hot flashes 9. Does not have advance directives 10. peripheral neuropathy essentially resolved off of taxane. Gabapentin helpful previously. 11.needs flu vaccine this fall  Further appointments here depending on hospital course. Time spent 20 min including >50% counseling and coordination of care.   Evlyn Clines, MD   09/20/2016, 3:26 PM

## 2016-09-20 NOTE — Patient Instructions (Signed)

## 2016-09-27 ENCOUNTER — Other Ambulatory Visit: Payer: BC Managed Care – PPO

## 2016-09-27 ENCOUNTER — Ambulatory Visit: Payer: BC Managed Care – PPO

## 2016-09-30 ENCOUNTER — Other Ambulatory Visit: Payer: Self-pay | Admitting: Oncology

## 2016-10-01 ENCOUNTER — Other Ambulatory Visit: Payer: BC Managed Care – PPO

## 2016-10-01 ENCOUNTER — Ambulatory Visit: Payer: BC Managed Care – PPO | Admitting: Oncology

## 2016-10-05 ENCOUNTER — Ambulatory Visit: Payer: BC Managed Care – PPO

## 2016-10-11 ENCOUNTER — Other Ambulatory Visit: Payer: BC Managed Care – PPO

## 2016-10-11 ENCOUNTER — Ambulatory Visit: Payer: BC Managed Care – PPO | Admitting: Oncology

## 2016-11-12 ENCOUNTER — Telehealth: Payer: Self-pay | Admitting: Gynecologic Oncology

## 2016-11-12 NOTE — Telephone Encounter (Signed)
[facility] just called with a critical lab draw on [facility]. He says the lab was done by [facility].  Blood glucose 543.  Message sent to Dr. Alycia Rossetti and RN Verdon Cummins.

## 2016-12-31 DEATH — deceased

## 2017-03-23 IMAGING — CT CT ABD-PELV W/ CM
2 of 4 series · 15 of 46 positions shown, 17 images · IV contrast (ISOVUE)
Comparison: PET of 05/31/2016. Abdominal pelvic CT of 03/16/2016.
Most recent chest CT of 01/20/2016.

CLINICAL DATA: Epithelial ovarian carcinoma.  Ongoing chemotherapy.

EXAM:
CT CHEST, ABDOMEN, AND PELVIS WITH CONTRAST
TECHNIQUE: Multidetector CT imaging of the chest, abdomen and pelvis was
performed following the standard protocol during bolus
administration of intravenous contrast.
CONTRAST:  100mL [id number] IOPAMIDOL ([id number]) INJECTION 61%

[Series 2: cap with st · axial · 0.78mm/px · z∈[-609,-69]mm · 12 of 128 slices shown, 14 images]
[im 10/128  soft-tissue]
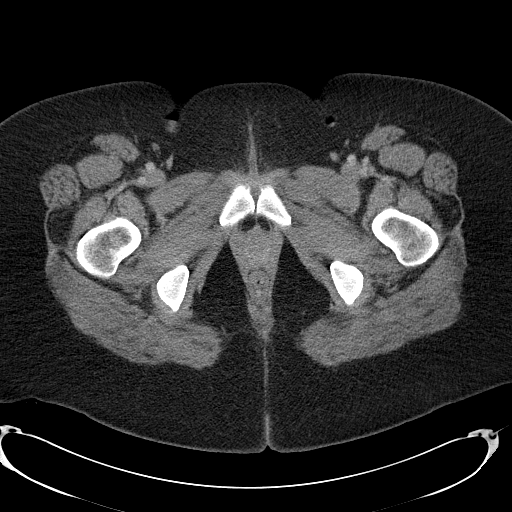
[im 10/128  bone]
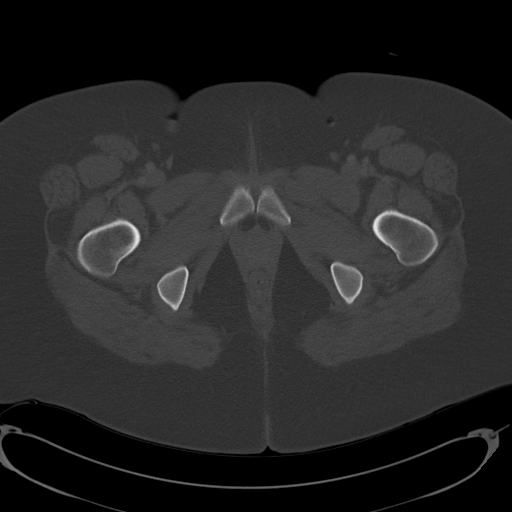
[im 19/128  soft-tissue]
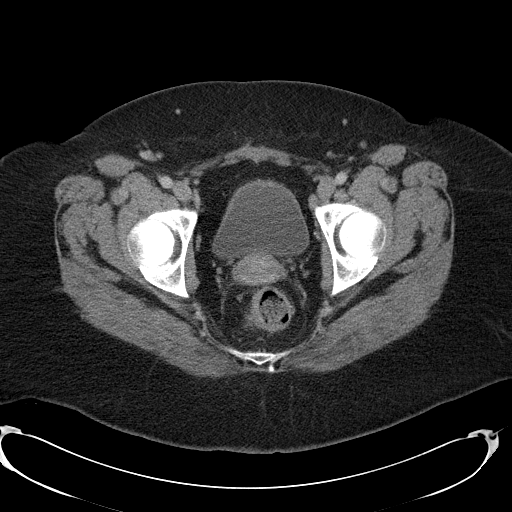
[im 28/128  soft-tissue]
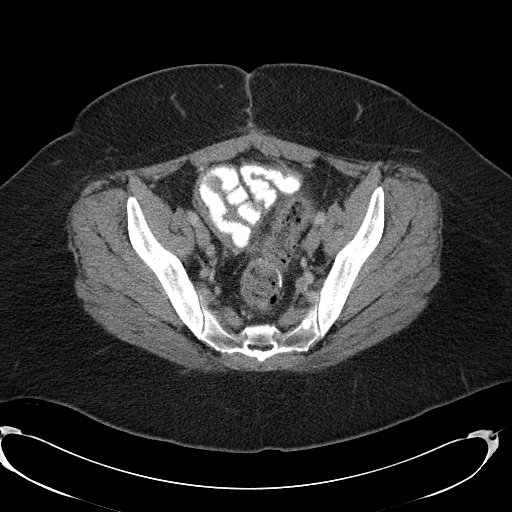
[im 37/128  soft-tissue]
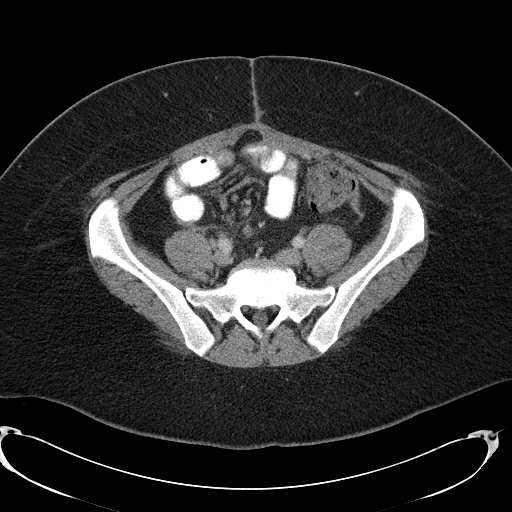
[im 46/128  soft-tissue]
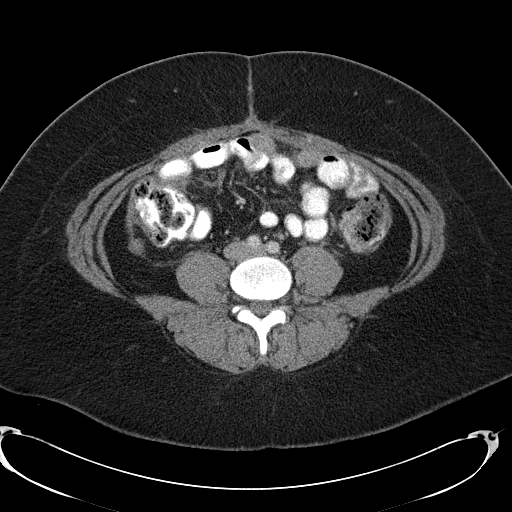
[im 55/128  soft-tissue]
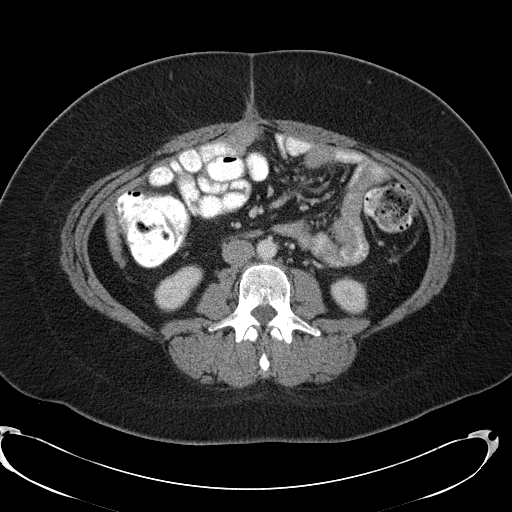
[im 73/128  soft-tissue]
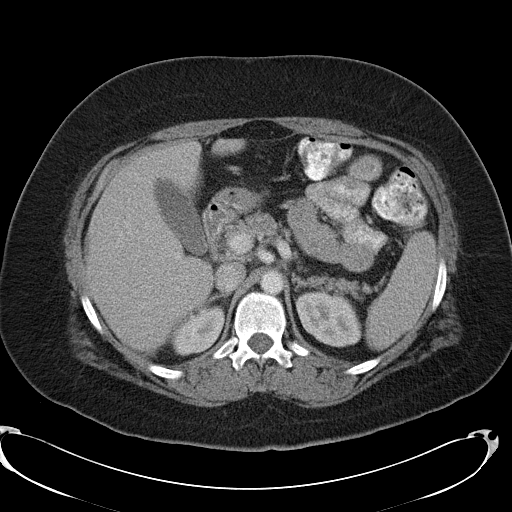
[im 82/128  soft-tissue]
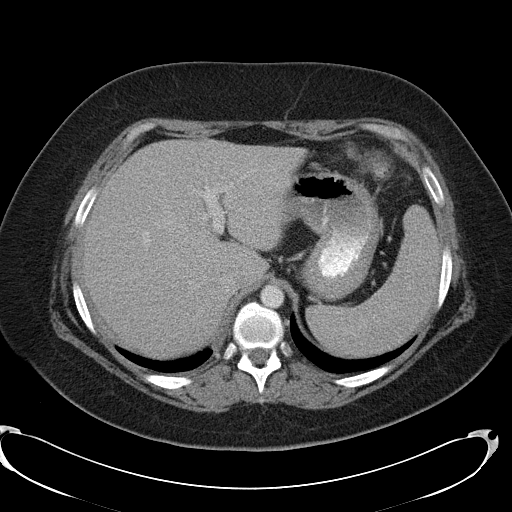
[im 91/128  soft-tissue]
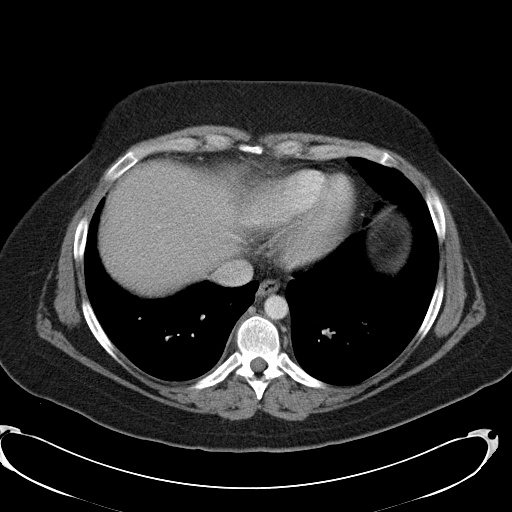
[im 91/128  bone]
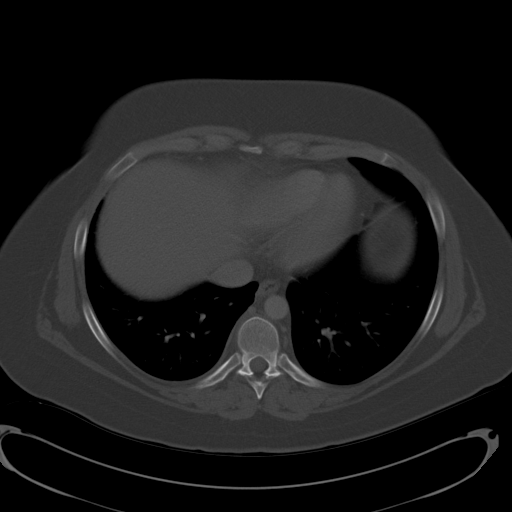
[im 100/128  soft-tissue]
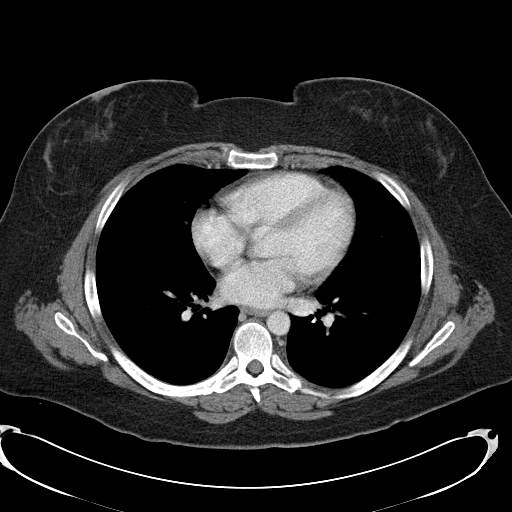
[im 109/128  soft-tissue]
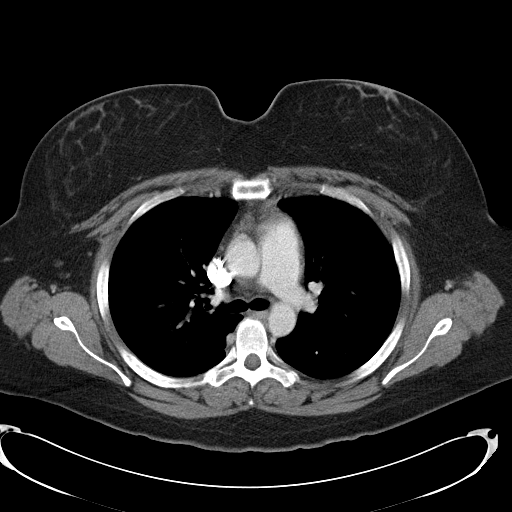
[im 118/128  soft-tissue]
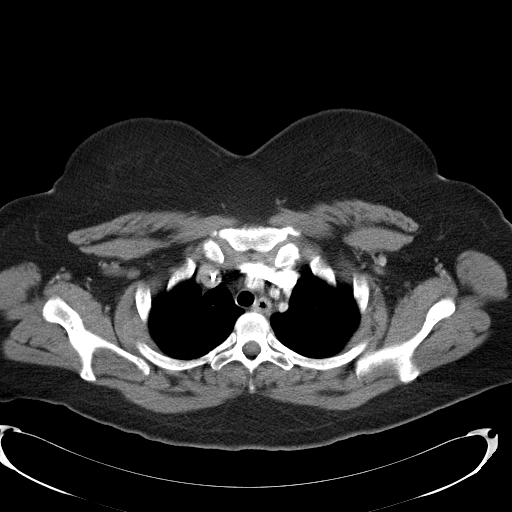

[Series 602: <mpr thick range> · coronal · 1.25mm/px · 3 of 156 slices shown]
[im 52/156  soft-tissue]
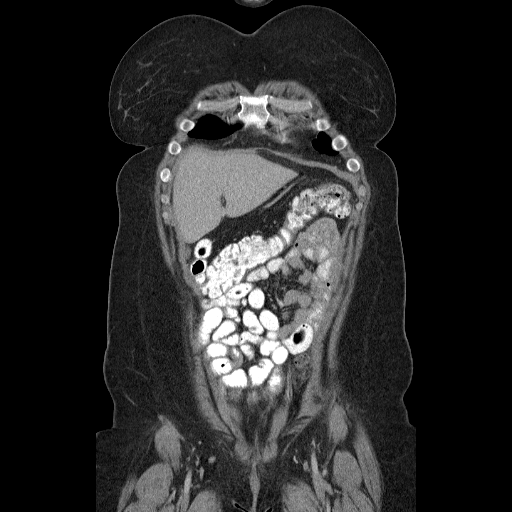
[im 69/156  soft-tissue]
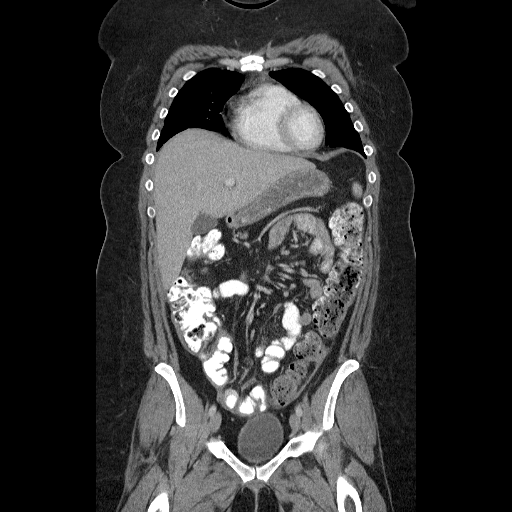
[im 87/156  soft-tissue]
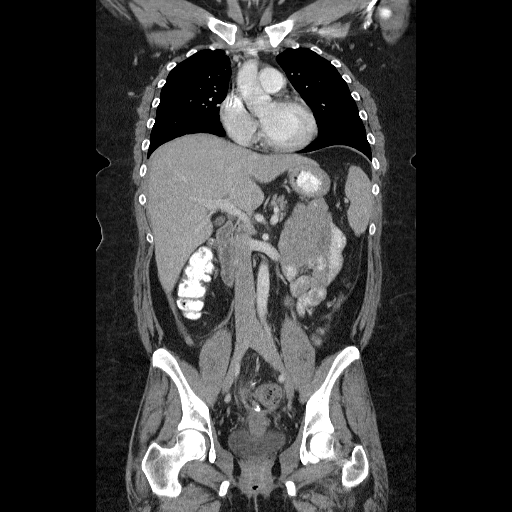

[15 of 46 positions shown; findings below may reference images not displayed]

FINDINGS: RECIST

Target Lesions:

1. None
2.
Non-target Lesions:

1. Thickening of both paracolic gutters, and along the right hepatic
capsule.
2. Trace right pleural thickening.
CT CHEST FINDINGS

Mediastinum/Lymph Nodes: No supraclavicular adenopathy. A right
Port-A-Cath terminates at the high right atrium. No axillary
adenopathy. Normal heart size, without pericardial effusion. No
central pulmonary embolism, on this non-dedicated study. No
mediastinal or hilar adenopathy. Soft tissue density in the anterior
mediastinum is likely residual thymus.

Lungs/Pleura: No pleural fluid. Trace right pleural thickening is
similar. Azygos fissure.

Clear lungs.

Musculoskeletal: No acute osseous abnormality.

CT ABDOMEN PELVIS FINDINGS

Hepatobiliary: Normal liver. Normal gallbladder, without biliary
ductal dilatation.

Pancreas: Normal, without mass or ductal dilatation.

Spleen: Hypo attenuating foci along the splenic capsule, including
superiorly at 9 mm on image 41/series 2. Similar to 03/16/2016 (when
remeasured). No hypermetabolism in this area on prior PET.

Adrenals/Urinary Tract: Normal adrenal glands. Normal kidneys,
without hydronephrosis. Normal urinary bladder.

Stomach/Bowel: Normal stomach, without wall thickening. Colonic
stool burden suggests constipation. Normal terminal ileum. Suspect
appendiceal enlargement of 11 mm on image 85/ series 2. This is
difficult to differentiate from the adjacent thickening of the right
paracolic gutter. No surrounding inflammation. The appendix measured
10 mm on the prior PET and the CT of 03/16/2016.

Normal small bowel caliber.

Vascular/Lymphatic: Normal caliber of the aorta and branch vessels.
No abdominopelvic adenopathy.

Reproductive: Hysterectomy.  No adnexal mass.

Other: No significant free fluid. Thickening of the right paracolic
gutter including on image 77/series 2. This is similar to on the
most recent CT but new or increased since 03/16/2016 CT. Similarly,
there is left paracolic gutter thickening on image 64/series 2 which
is similar to the most recent PET. No well-defined omental nodule.

Right hepatic lobe capsular irregularity measures 8 mm on image
54/series 2 and is unchanged compared to 03/16/2016 CT.

Musculoskeletal: Left iliac sclerotic lesion is unchanged and likely
a bone island. No suspicious osseous lesion.
IMPRESSION: 1. Thickening of both paracolic gutters, similar to most recent PET
but new or progressive since 03/16/2016. Right hepatic capsular
irregularity is similar back to 03/16/2016.
2. No new sites of disease identified.
3. Enlargement of the appendix, contiguous with the right paracolic
gutter thickening. No surrounding inflammation. Possibly related to
surrounding peritoneal metastasis. Correlate with right lower
quadrant symptoms and recommend attention on follow-up.
4. No evidence of thoracic metastatic disease.
5. Probable incidental splenic lesions. Given capsular location,
recommend attention on follow-up to exclude less likely foci of
peritoneal metastasis.

## 2017-04-25 IMAGING — DX DG ABD PORTABLE 1V
2 series · 3 of 3 positions shown · non-contrast
Comparison: No prior.

CLINICAL DATA: Small-bowel obstruction.

EXAM:
PORTABLE ABDOMEN - 1 VIEW

[abdomen kub (1 of 2)]
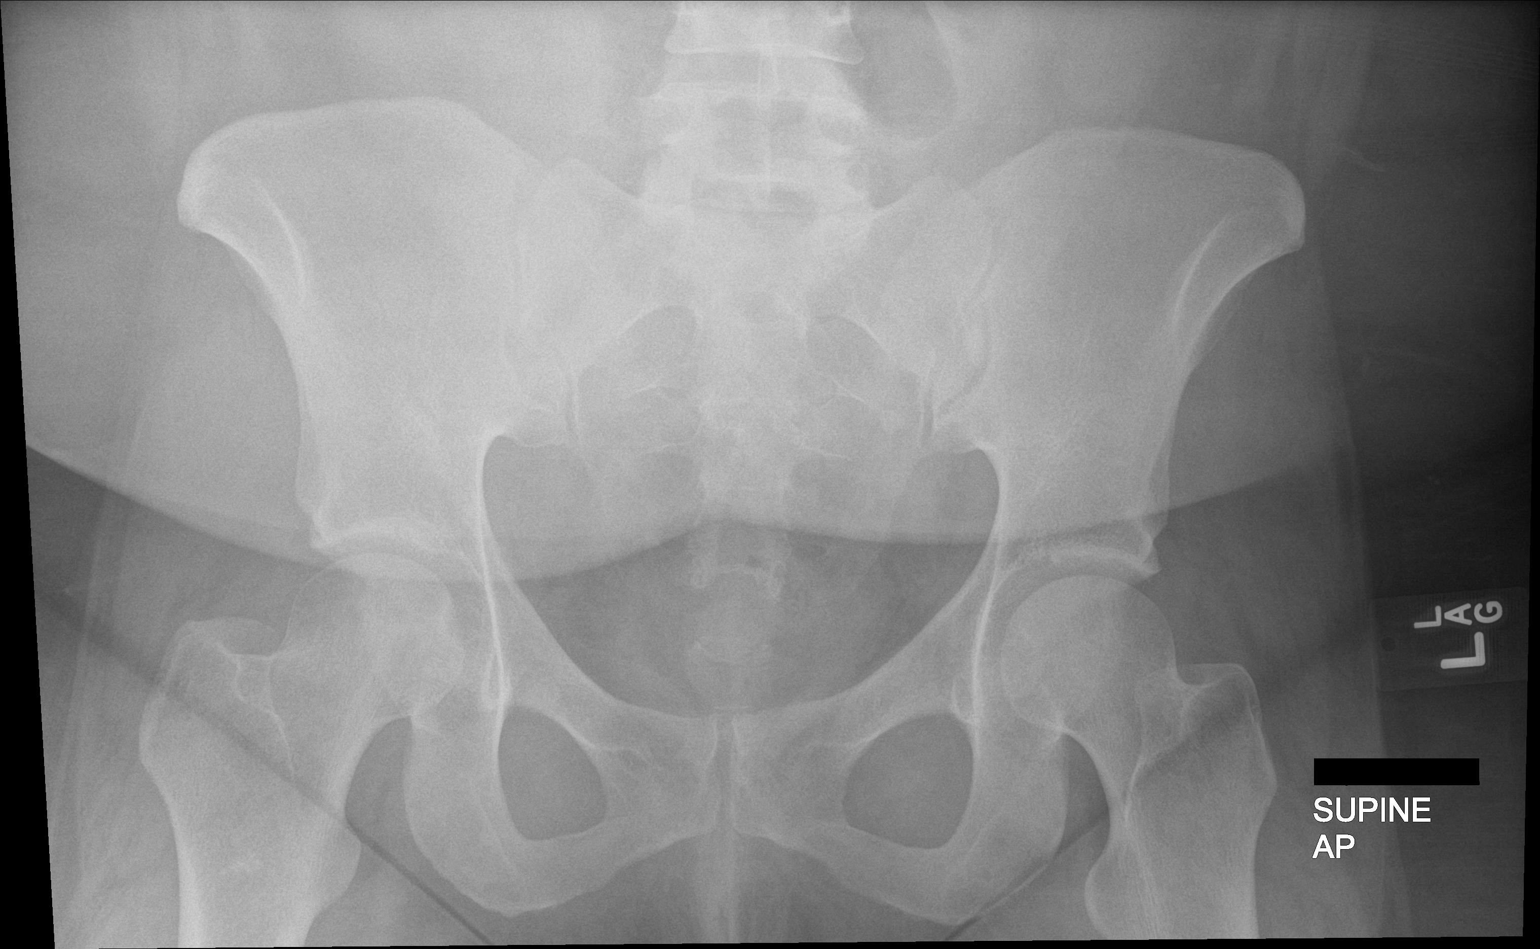

[Series 2: abdomen kub · 0.14mm/px · 2 of 2 slices shown (2 of 2)]
[im 1/2]
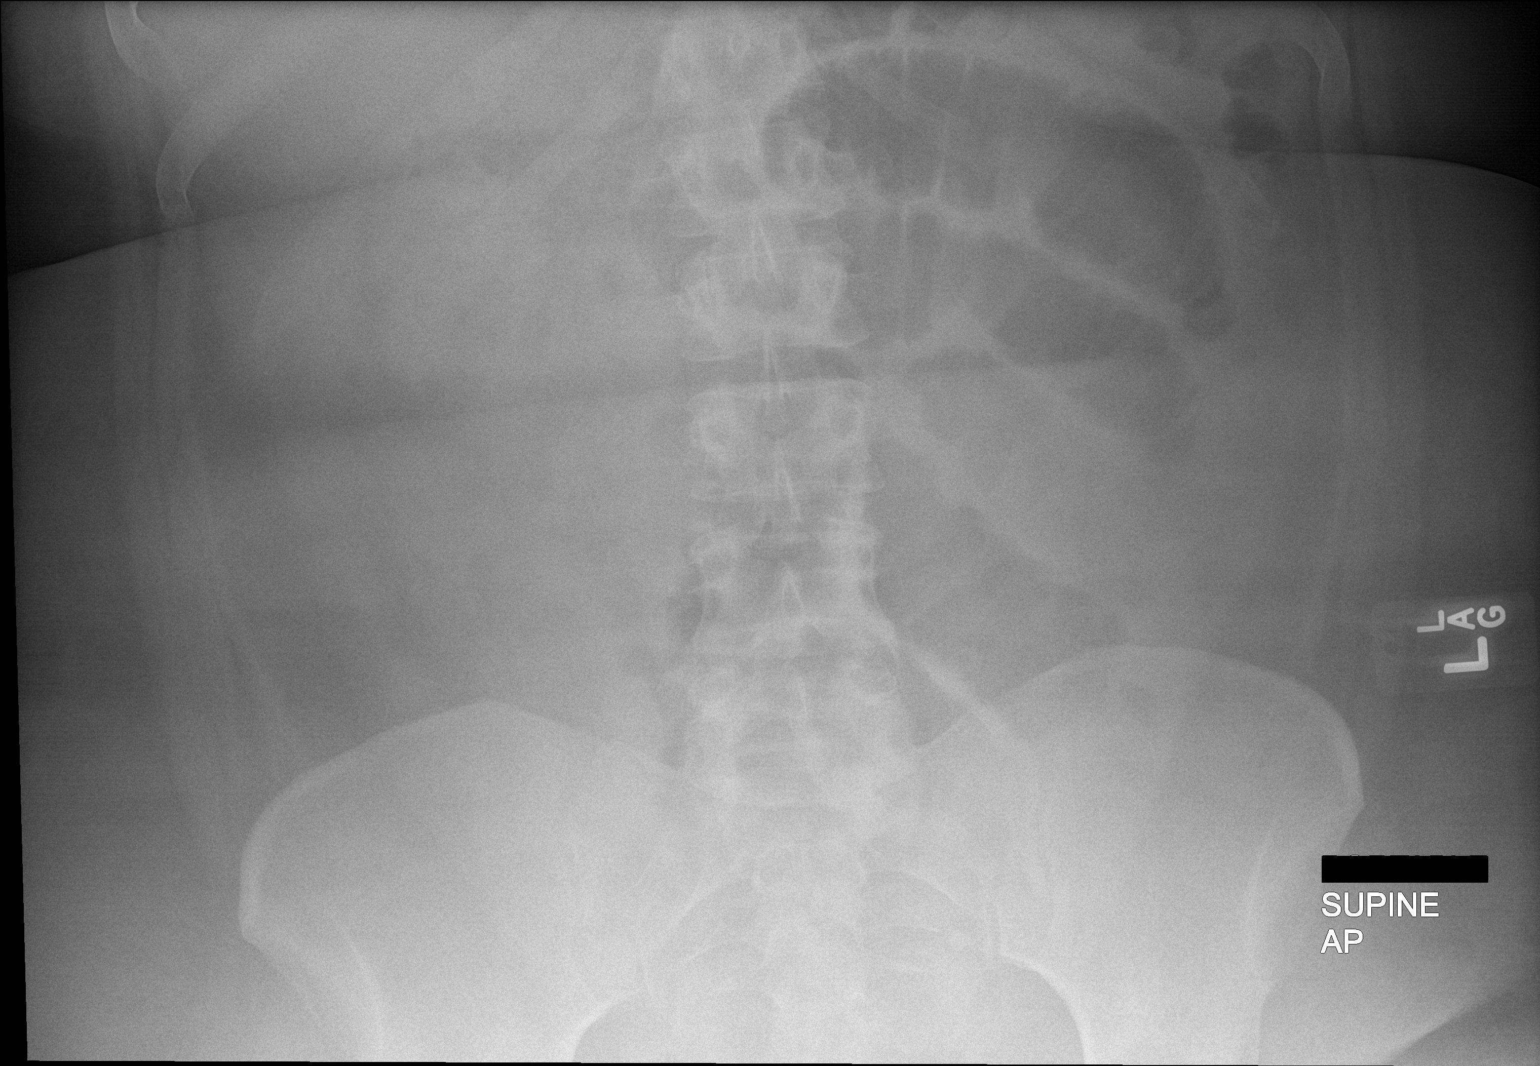
[im 2/2]
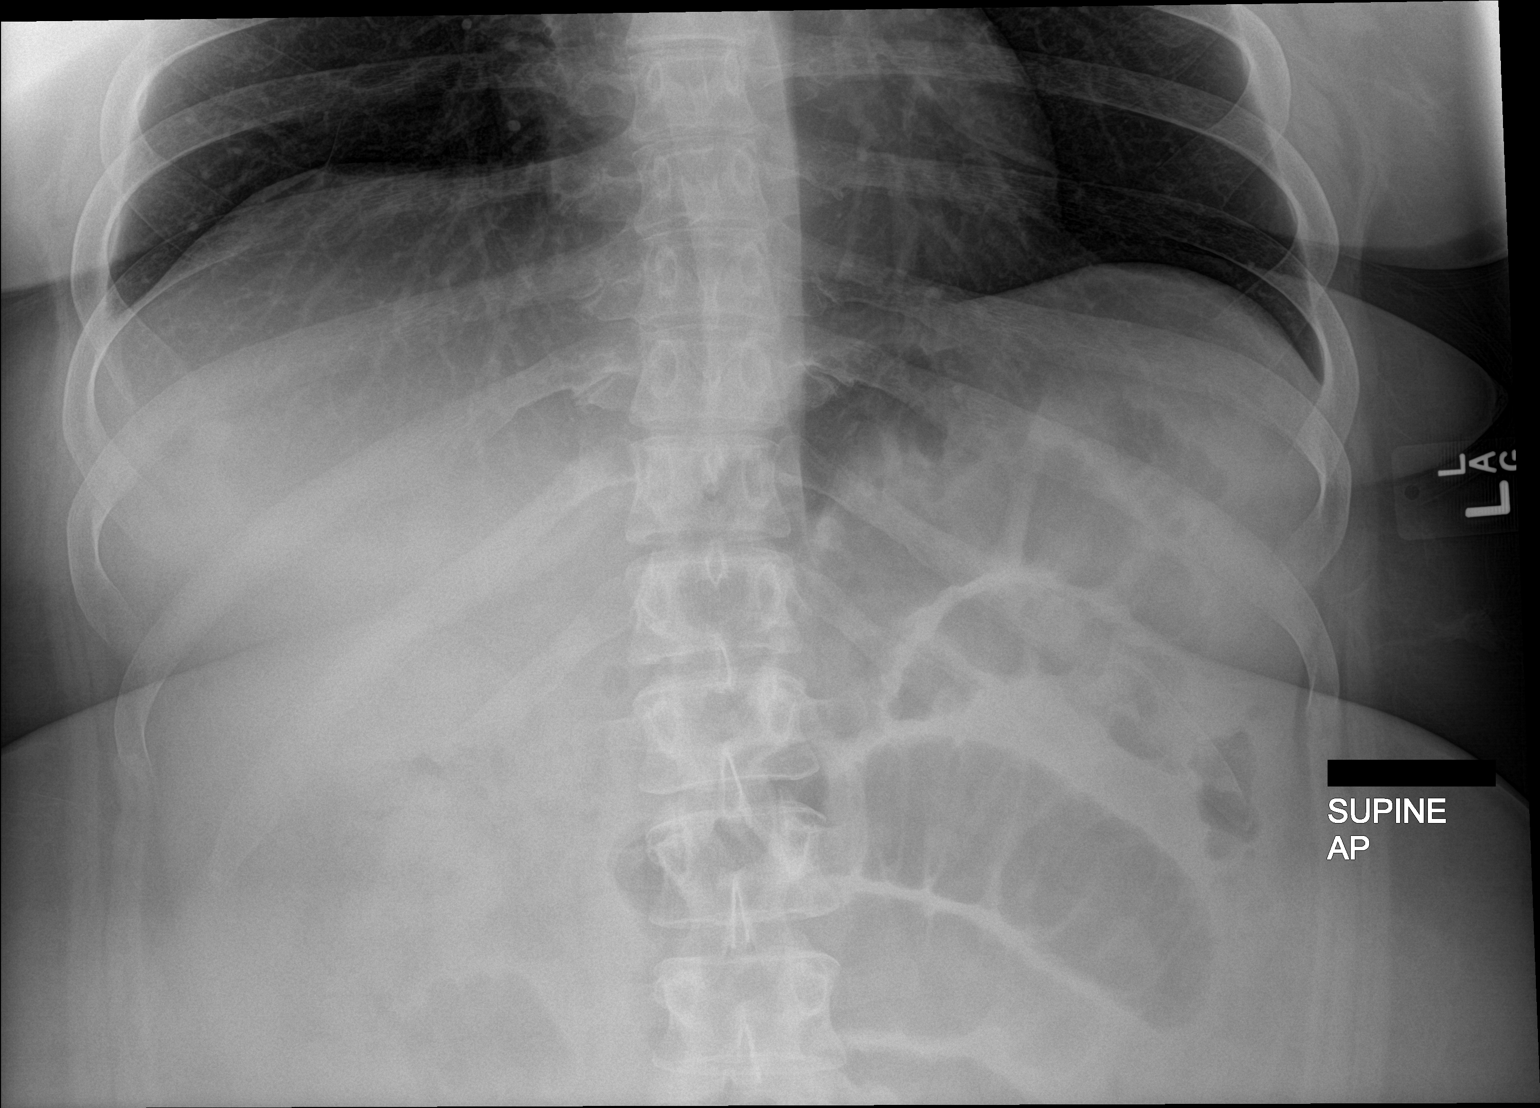

[3 of 3 positions shown; findings below may reference images not displayed]

By history the patient has had a CT yesterday
which is unavailable, reference is made to that report.
FINDINGS: Dilated loops of proximal small bowel with paucity of the intra
colonic air noted consistent small bowel obstruction. Questionable
free intraperitoneal air. No pathologic intra-abdominal
calcifications. No acute bony abnormality.
IMPRESSION: 1. Proximal small bowel obstruction.

2. Cannot exclude subtle free intraperitoneal air. By history the
patient has had a prior CT yesterday which is unavailable. Reference
is made to that report. A a follow-up IV contrast-enhanced CT can be
obtained as needed. Follow-up three-way of the abdomen can be
obtained as needed.

Critical Value/emergent results were called by telephone at the time
of interpretation on 07/17/2016 at [DATE] to nurse Td, who verbally
acknowledged these results.

## 2017-06-23 IMAGING — DX DG ABDOMEN 2V
2 series · 2 of 2 positions shown · non-contrast
Comparison: None.

CLINICAL DATA: Nausea, vomiting and diarrhea for 4 days. History of
ovarian cancer.

EXAM:
ABDOMEN - 2 VIEW

[abdomen erect]
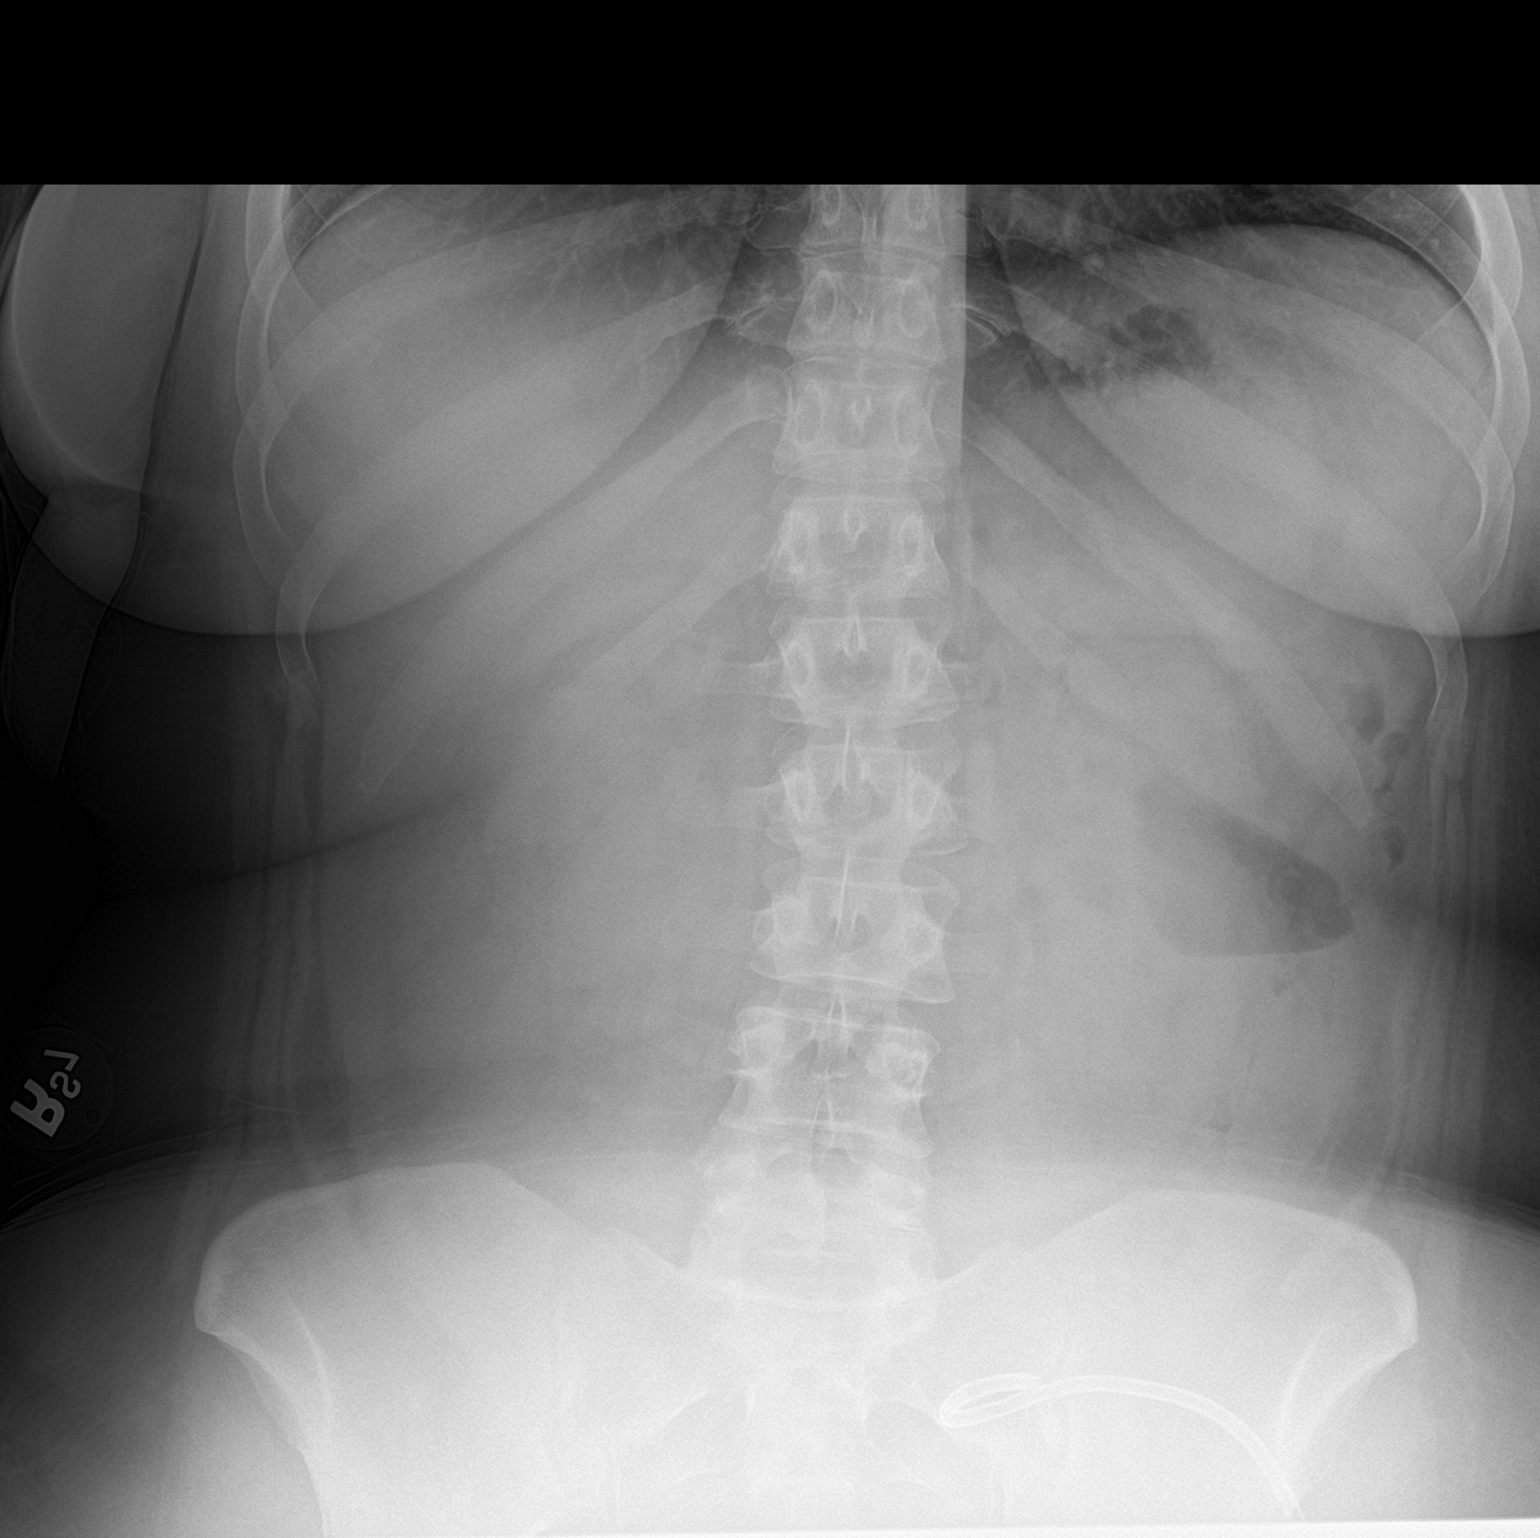

[abdomen supine]
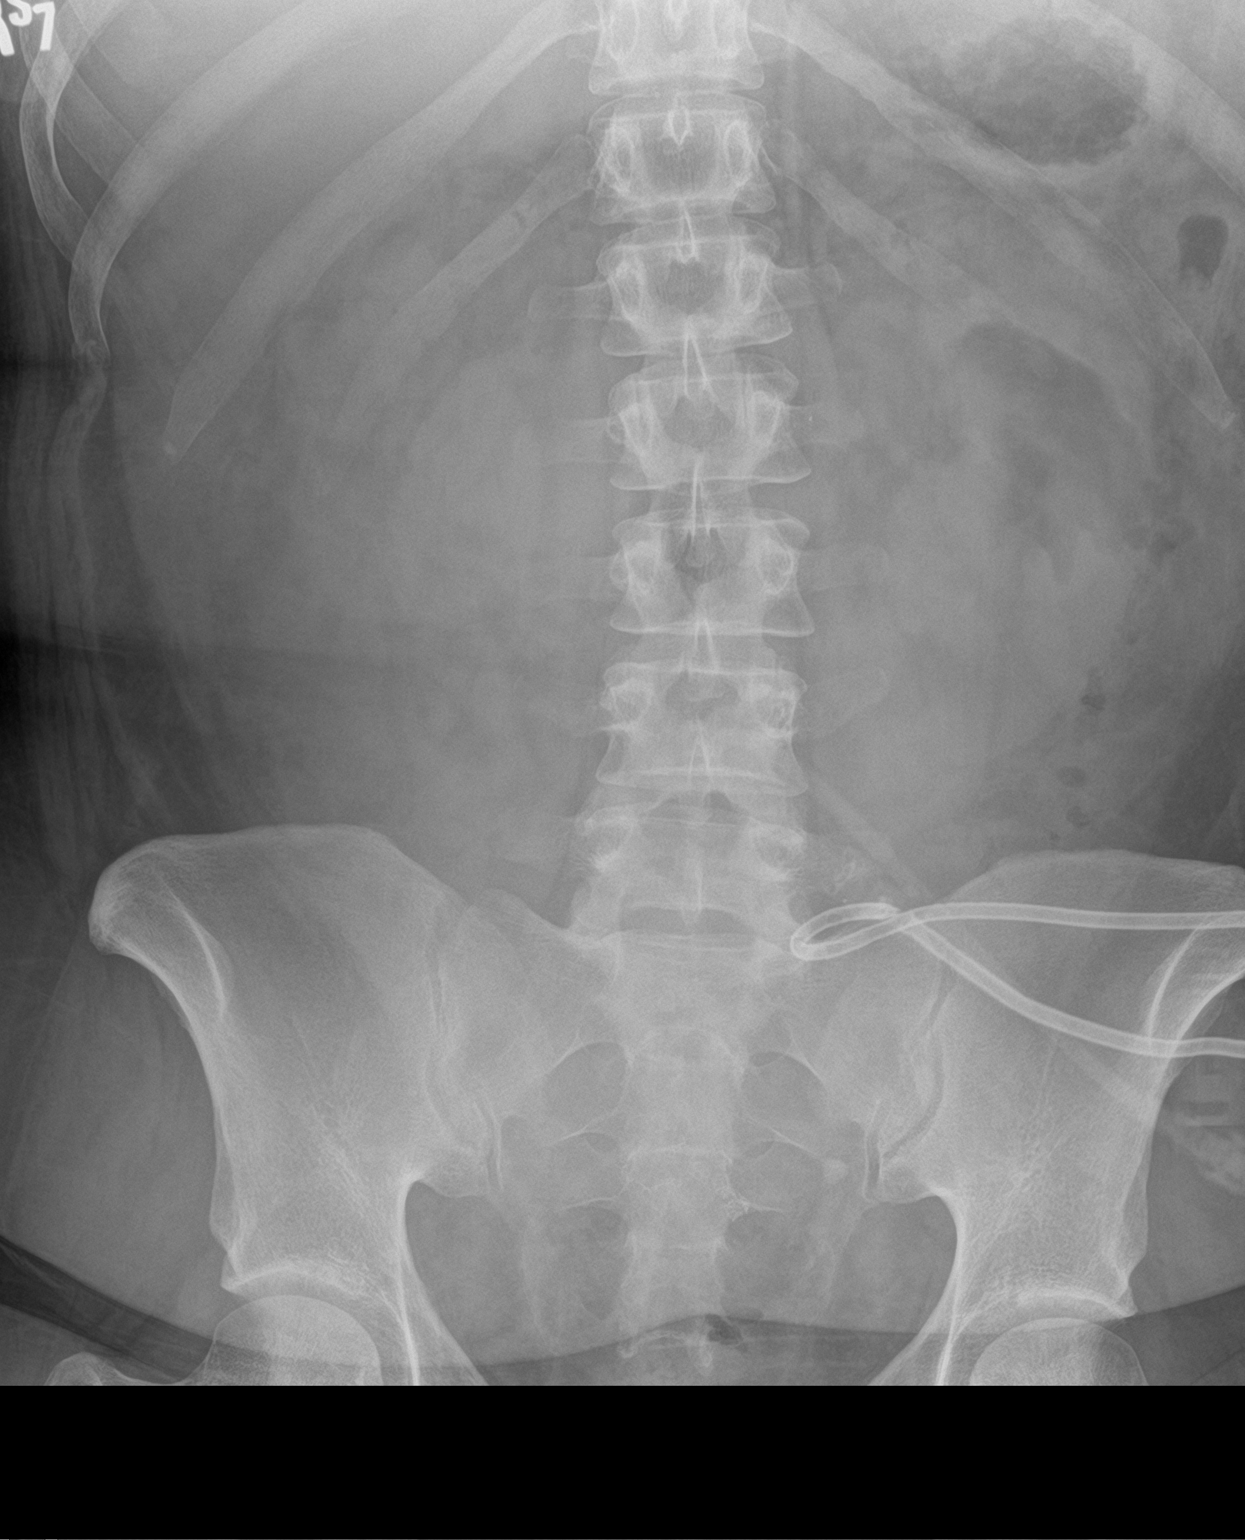

[2 of 2 positions shown; findings below may reference images not displayed]

FINDINGS: The bowel gas pattern is normal.  There is no evidence of free air.

Catheter overlying the lower left abdomen is noted.

No suspicious calcifications or bony abnormalities noted.
IMPRESSION: No acute abnormality.  Unremarkable bowel gas pattern.

## 2018-11-08 ENCOUNTER — Other Ambulatory Visit: Payer: Self-pay | Admitting: Nurse Practitioner
